# Patient Record
Sex: Male | Born: 1935
Health system: Southern US, Community
[De-identification: ages and names within clinical notes are randomized; demographics above are authoritative.]

## PROBLEM LIST (undated history)

## (undated) DIAGNOSIS — H269 Unspecified cataract: Secondary | ICD-10-CM

## (undated) DIAGNOSIS — H04123 Dry eye syndrome of bilateral lacrimal glands: Secondary | ICD-10-CM

## (undated) DIAGNOSIS — D649 Anemia, unspecified: Secondary | ICD-10-CM

## (undated) DIAGNOSIS — F419 Anxiety disorder, unspecified: Secondary | ICD-10-CM

## (undated) DIAGNOSIS — E119 Type 2 diabetes mellitus without complications: Secondary | ICD-10-CM

## (undated) DIAGNOSIS — C689 Malignant neoplasm of urinary organ, unspecified: Secondary | ICD-10-CM

## (undated) DIAGNOSIS — Z8551 Personal history of malignant neoplasm of bladder: Secondary | ICD-10-CM

## (undated) DIAGNOSIS — E785 Hyperlipidemia, unspecified: Secondary | ICD-10-CM

## (undated) DIAGNOSIS — M159 Polyosteoarthritis, unspecified: Secondary | ICD-10-CM

## (undated) DIAGNOSIS — K219 Gastro-esophageal reflux disease without esophagitis: Secondary | ICD-10-CM

## (undated) DIAGNOSIS — I10 Essential (primary) hypertension: Secondary | ICD-10-CM

## (undated) DIAGNOSIS — Z5189 Encounter for other specified aftercare: Secondary | ICD-10-CM

## (undated) DIAGNOSIS — Z932 Ileostomy status: Secondary | ICD-10-CM

## (undated) DIAGNOSIS — D3502 Benign neoplasm of left adrenal gland: Secondary | ICD-10-CM

## (undated) HISTORY — DX: Hyperlipidemia, unspecified: E78.5

## (undated) HISTORY — DX: Encounter for other specified aftercare: Z51.89

## (undated) HISTORY — DX: Malignant neoplasm of urinary organ, unspecified: C68.9

## (undated) HISTORY — DX: Anemia, unspecified: D64.9

## (undated) HISTORY — DX: Unspecified cataract: H26.9

## (undated) HISTORY — PX: CATARACT EXTRACTION: SUR2

---

## 1998-03-04 ENCOUNTER — Ambulatory Visit (HOSPITAL_COMMUNITY): Admission: RE | Admit: 1998-03-04 | Discharge: 1998-03-04 | Payer: Self-pay | Admitting: Gastroenterology

## 1998-03-09 ENCOUNTER — Ambulatory Visit (HOSPITAL_BASED_OUTPATIENT_CLINIC_OR_DEPARTMENT_OTHER): Admission: RE | Admit: 1998-03-09 | Discharge: 1998-03-09 | Payer: Self-pay | Admitting: Urology

## 1998-05-16 HISTORY — PX: OTHER SURGICAL HISTORY: SHX169

## 1998-10-14 ENCOUNTER — Ambulatory Visit (HOSPITAL_BASED_OUTPATIENT_CLINIC_OR_DEPARTMENT_OTHER): Admission: RE | Admit: 1998-10-14 | Discharge: 1998-10-14 | Payer: Self-pay | Admitting: Urology

## 1998-11-04 ENCOUNTER — Encounter: Payer: Self-pay | Admitting: Urology

## 1998-11-04 ENCOUNTER — Observation Stay (HOSPITAL_COMMUNITY): Admission: RE | Admit: 1998-11-04 | Discharge: 1998-11-05 | Payer: Self-pay | Admitting: Urology

## 1998-12-01 ENCOUNTER — Inpatient Hospital Stay (HOSPITAL_COMMUNITY): Admission: RE | Admit: 1998-12-01 | Discharge: 1998-12-15 | Payer: Self-pay | Admitting: Urology

## 1998-12-01 ENCOUNTER — Encounter: Payer: Self-pay | Admitting: Urology

## 1998-12-01 ENCOUNTER — Encounter (INDEPENDENT_AMBULATORY_CARE_PROVIDER_SITE_OTHER): Payer: Self-pay | Admitting: Specialist

## 1998-12-09 ENCOUNTER — Encounter: Payer: Self-pay | Admitting: Urology

## 1999-03-10 ENCOUNTER — Encounter: Admission: RE | Admit: 1999-03-10 | Discharge: 1999-03-10 | Payer: Self-pay | Admitting: Hematology & Oncology

## 1999-03-10 ENCOUNTER — Encounter: Payer: Self-pay | Admitting: Hematology & Oncology

## 2000-01-20 ENCOUNTER — Encounter: Payer: Self-pay | Admitting: Emergency Medicine

## 2000-01-20 ENCOUNTER — Emergency Department (HOSPITAL_COMMUNITY): Admission: EM | Admit: 2000-01-20 | Discharge: 2000-01-20 | Payer: Self-pay | Admitting: Emergency Medicine

## 2000-01-24 ENCOUNTER — Inpatient Hospital Stay (HOSPITAL_COMMUNITY): Admission: EM | Admit: 2000-01-24 | Discharge: 2000-01-26 | Payer: Self-pay | Admitting: Internal Medicine

## 2000-01-24 ENCOUNTER — Encounter (INDEPENDENT_AMBULATORY_CARE_PROVIDER_SITE_OTHER): Payer: Self-pay | Admitting: Specialist

## 2000-01-24 ENCOUNTER — Encounter: Payer: Self-pay | Admitting: Internal Medicine

## 2000-01-26 ENCOUNTER — Encounter: Payer: Self-pay | Admitting: Internal Medicine

## 2000-12-06 ENCOUNTER — Encounter: Payer: Self-pay | Admitting: Hematology & Oncology

## 2000-12-06 ENCOUNTER — Encounter: Admission: RE | Admit: 2000-12-06 | Discharge: 2000-12-06 | Payer: Self-pay | Admitting: Hematology & Oncology

## 2001-04-09 ENCOUNTER — Encounter (INDEPENDENT_AMBULATORY_CARE_PROVIDER_SITE_OTHER): Payer: Self-pay

## 2001-04-09 ENCOUNTER — Ambulatory Visit (HOSPITAL_COMMUNITY): Admission: RE | Admit: 2001-04-09 | Discharge: 2001-04-09 | Payer: Self-pay | Admitting: Gastroenterology

## 2004-06-09 ENCOUNTER — Ambulatory Visit: Payer: Self-pay | Admitting: Hematology & Oncology

## 2004-08-05 ENCOUNTER — Ambulatory Visit: Payer: Self-pay | Admitting: Internal Medicine

## 2004-12-07 ENCOUNTER — Ambulatory Visit: Payer: Self-pay | Admitting: Hematology & Oncology

## 2004-12-21 ENCOUNTER — Ambulatory Visit: Payer: Self-pay | Admitting: Internal Medicine

## 2005-01-21 LAB — HM COLONOSCOPY

## 2005-03-15 ENCOUNTER — Ambulatory Visit: Payer: Self-pay | Admitting: Internal Medicine

## 2005-03-17 ENCOUNTER — Ambulatory Visit: Payer: Self-pay

## 2005-04-13 ENCOUNTER — Ambulatory Visit: Payer: Self-pay | Admitting: Internal Medicine

## 2005-04-14 ENCOUNTER — Ambulatory Visit: Payer: Self-pay | Admitting: Internal Medicine

## 2005-12-05 ENCOUNTER — Ambulatory Visit: Payer: Self-pay | Admitting: Hematology & Oncology

## 2006-01-11 ENCOUNTER — Emergency Department (HOSPITAL_COMMUNITY): Admission: EM | Admit: 2006-01-11 | Discharge: 2006-01-11 | Payer: Self-pay | Admitting: Emergency Medicine

## 2006-01-18 ENCOUNTER — Ambulatory Visit: Payer: Self-pay | Admitting: Internal Medicine

## 2006-01-23 ENCOUNTER — Ambulatory Visit: Payer: Self-pay | Admitting: Internal Medicine

## 2006-01-30 ENCOUNTER — Ambulatory Visit: Payer: Self-pay | Admitting: Internal Medicine

## 2006-04-27 ENCOUNTER — Ambulatory Visit: Payer: Self-pay | Admitting: Internal Medicine

## 2006-08-16 ENCOUNTER — Ambulatory Visit: Payer: Self-pay | Admitting: Internal Medicine

## 2006-12-06 ENCOUNTER — Ambulatory Visit: Payer: Self-pay | Admitting: Internal Medicine

## 2007-01-23 DIAGNOSIS — F411 Generalized anxiety disorder: Secondary | ICD-10-CM | POA: Insufficient documentation

## 2007-01-23 DIAGNOSIS — Z8679 Personal history of other diseases of the circulatory system: Secondary | ICD-10-CM | POA: Insufficient documentation

## 2007-01-23 DIAGNOSIS — K219 Gastro-esophageal reflux disease without esophagitis: Secondary | ICD-10-CM | POA: Insufficient documentation

## 2007-01-23 DIAGNOSIS — Z9079 Acquired absence of other genital organ(s): Secondary | ICD-10-CM | POA: Insufficient documentation

## 2007-01-23 DIAGNOSIS — C679 Malignant neoplasm of bladder, unspecified: Secondary | ICD-10-CM | POA: Insufficient documentation

## 2007-01-23 DIAGNOSIS — I10 Essential (primary) hypertension: Secondary | ICD-10-CM | POA: Insufficient documentation

## 2007-01-23 DIAGNOSIS — E109 Type 1 diabetes mellitus without complications: Secondary | ICD-10-CM | POA: Insufficient documentation

## 2007-03-21 ENCOUNTER — Encounter: Payer: Self-pay | Admitting: Internal Medicine

## 2007-04-24 ENCOUNTER — Ambulatory Visit: Payer: Self-pay | Admitting: Internal Medicine

## 2007-06-15 ENCOUNTER — Telehealth: Payer: Self-pay | Admitting: Internal Medicine

## 2007-07-19 ENCOUNTER — Encounter: Payer: Self-pay | Admitting: Internal Medicine

## 2007-08-03 ENCOUNTER — Encounter: Payer: Self-pay | Admitting: Internal Medicine

## 2007-10-10 ENCOUNTER — Ambulatory Visit: Payer: Self-pay | Admitting: Internal Medicine

## 2007-10-16 ENCOUNTER — Telehealth: Payer: Self-pay | Admitting: Internal Medicine

## 2008-01-24 ENCOUNTER — Encounter: Payer: Self-pay | Admitting: Internal Medicine

## 2008-04-18 ENCOUNTER — Ambulatory Visit: Payer: Self-pay | Admitting: Internal Medicine

## 2008-07-30 ENCOUNTER — Telehealth: Payer: Self-pay | Admitting: Family Medicine

## 2008-07-31 ENCOUNTER — Ambulatory Visit: Payer: Self-pay | Admitting: Internal Medicine

## 2008-08-29 ENCOUNTER — Ambulatory Visit: Payer: Self-pay | Admitting: Internal Medicine

## 2008-08-29 DIAGNOSIS — M1710 Unilateral primary osteoarthritis, unspecified knee: Secondary | ICD-10-CM

## 2008-08-29 DIAGNOSIS — IMO0002 Reserved for concepts with insufficient information to code with codable children: Secondary | ICD-10-CM | POA: Insufficient documentation

## 2008-10-10 ENCOUNTER — Telehealth: Payer: Self-pay | Admitting: Internal Medicine

## 2008-10-13 ENCOUNTER — Encounter: Payer: Self-pay | Admitting: Internal Medicine

## 2008-11-03 ENCOUNTER — Telehealth: Payer: Self-pay | Admitting: Internal Medicine

## 2009-01-27 ENCOUNTER — Telehealth: Payer: Self-pay | Admitting: Internal Medicine

## 2009-03-02 ENCOUNTER — Ambulatory Visit: Payer: Self-pay | Admitting: Internal Medicine

## 2009-03-11 ENCOUNTER — Encounter: Payer: Self-pay | Admitting: Internal Medicine

## 2009-04-15 ENCOUNTER — Encounter: Payer: Self-pay | Admitting: Internal Medicine

## 2009-06-22 ENCOUNTER — Encounter: Payer: Self-pay | Admitting: Internal Medicine

## 2009-07-16 ENCOUNTER — Encounter: Payer: Self-pay | Admitting: Internal Medicine

## 2009-09-07 ENCOUNTER — Ambulatory Visit: Payer: Self-pay | Admitting: Internal Medicine

## 2009-12-14 ENCOUNTER — Encounter: Payer: Self-pay | Admitting: Internal Medicine

## 2010-02-18 ENCOUNTER — Encounter: Payer: Self-pay | Admitting: Internal Medicine

## 2010-03-02 ENCOUNTER — Encounter: Payer: Self-pay | Admitting: Internal Medicine

## 2010-03-23 ENCOUNTER — Ambulatory Visit: Payer: Self-pay | Admitting: Internal Medicine

## 2010-03-23 DIAGNOSIS — E785 Hyperlipidemia, unspecified: Secondary | ICD-10-CM | POA: Insufficient documentation

## 2010-03-23 DIAGNOSIS — E1069 Type 1 diabetes mellitus with other specified complication: Secondary | ICD-10-CM | POA: Insufficient documentation

## 2010-06-17 NOTE — Assessment & Plan Note (Signed)
Summary: FU Natale Milch   Vital Signs:  Patient profile:   77 year old male Height:      71 inches Weight:      241 pounds BMI:     33.73 O2 Sat:      96 % on Room air Temp:     98.6 degrees F oral Pulse rate:   83 / minute BP sitting:   142 / 82  (left arm) Cuff size:   large  Vitals Entered By: Bill Salinas CMA (September 07, 2009 1:08 PM)  O2 Flow:  Room air CC: pt here with complaint of popping and pressure in left ear/ ab   Primary Care Provider:  Norins  CC:  pt here with complaint of popping and pressure in left ear/ ab.  History of Present Illness: presents for acute hearing loss in the left ear: sounded like a jack hammer. He also c/o feeling "drunk" after he takes his morning meds - metformin, omeprazole and aspirin. Last, he notices he will at times have an intention tremor.  He is otherwise feeling well. He has restarted his walking program - 30-45 minutes 3 times a week. He reports having an abdominal U/s at the [place] - no AAA.   Current Medications (verified): 1)  Aspirin 325 Mg  Tabs (Aspirin) .... Once Daily 2)  Simvastatin 40 Mg  Tabs (Simvastatin) .... Once Daily 3)  Metformin Hcl 1000 Mg  Tabs (Metformin Hcl) .... Two Times A Day 4)  Omeprazole 20 Mg  Tbec (Omeprazole) .... Take 1 Tablet By Mouth Two Times A Day 5)  Cozaar 100 Mg  Tabs (Losartan Potassium) .... Once Daily 6)  Novolin R 100 Unit/ml Soln (Insulin Regular Human) .... Sliding Scale As Needed 7)  Simethicone 80 Mg  Chew (Simethicone) .... As Needed 8)  Apap 325 Mg Tabs (Acetaminophen) .... As Needed 9)  Milk of Magnesia 400 Mg/4ml  Susp (Magnesium Hydroxide) .... As Needed 10)  Eye Drops 0.05 %  Soln (Tetrahydrozoline Hcl) .... As Needed 11)  Ranitidine Hcl 150 Mg Caps (Ranitidine Hcl) .Marland Kitchen.. 1 By Mouth Once Daily 12)  Vitamin C 500 Mg Tabs (Ascorbic Acid) .... Take 1 Tablet By Mouth Once A Day 13)  Iron 325 (65 Fe) Mg Tabs (Ferrous Sulfate) .... Take 1 Tablet By Mouth Two Times A Day 14)   Hydrocodone-Acetaminophen 5-500 Mg Tabs (Hydrocodone-Acetaminophen) .... Take 1 To 2 Tabs By Mouth Two Times A Day For Pain 15)  Lantus 100 Unit/ml Soln (Insulin Glargine) .... Start At 40 At Bedtime Units Qhs 16)  Promethazine-Codeine 6.25-10 Mg/32ml Syrp (Promethazine-Codeine) .Marland Kitchen.. 1 Tsp Q 6 Hrs As Needed.  Allergies (verified): 1)  Lisinopril  Past History:  Past Medical History: Last updated: 07/31/2008 Anxiety GERD Hypertension BLADDER CANCER (ICD-188.9) ANGINA, HX OF (ICD-V12.50) Diabetes mellitus, type II  Past Surgical History: Last updated: 04/24/2007 Appendectomy Radical cystoprastatectomy with creation of an ileal loop urinary diversion for bladder cancer PSH reviewed for relevance, FH reviewed for relevance  Review of Systems       The patient complains of decreased hearing.  The patient denies anorexia, fever, weight loss, weight gain, vision loss, dyspnea on exertion, prolonged cough, headaches, abdominal pain, severe indigestion/heartburn, incontinence, muscle weakness, difficulty walking, depression, enlarged lymph nodes, and angioedema.    Physical Exam  General:  overweight AA male no distress Head:  normocephalic and atraumatic.   Ears:  EACs after irrigation - some residual wax right, clear on the left Abdomen:  obese  Neurologic:  no obvious tremor at today's visit.    Impression & Recommendations:  Problem # 1:  CERUMEN IMPACTION, BILATERAL (ICD-380.4) Successful irrigation by Ami bullins, CMA  Problem # 2:  DIABETES MELLITUS, TYPE I (ICD-250.01) Patient is advised to take his metformin after breakfast to alleviate possible "drunk" feeling after morning meds.  His updated medication list for this problem includes:    Aspirin 325 Mg Tabs (Aspirin) ..... Once daily    Metformin Hcl 1000 Mg Tabs (Metformin hcl) .Marland Kitchen..Marland Kitchen Two times a day    Cozaar 100 Mg Tabs (Losartan potassium) ..... Once daily    Novolin R 100 Unit/ml Soln (Insulin regular human) .....  Sliding scale as needed    Lantus 100 Unit/ml Soln (Insulin glargine) ..... Start at 40 at bedtime units qhs  Complete Medication List: 1)  Aspirin 325 Mg Tabs (Aspirin) .... Once daily 2)  Simvastatin 40 Mg Tabs (Simvastatin) .... Once daily 3)  Metformin Hcl 1000 Mg Tabs (Metformin hcl) .... Two times a day 4)  Omeprazole 20 Mg Tbec (Omeprazole) .... Take 1 tablet by mouth two times a day 5)  Cozaar 100 Mg Tabs (Losartan potassium) .... Once daily 6)  Novolin R 100 Unit/ml Soln (Insulin regular human) .... Sliding scale as needed 7)  Simethicone 80 Mg Chew (Simethicone) .... As needed 8)  Apap 325 Mg Tabs (Acetaminophen) .... As needed 9)  Milk of Magnesia 400 Mg/26ml Susp (Magnesium hydroxide) .... As needed 10)  Eye Drops 0.05 % Soln (Tetrahydrozoline hcl) .... As needed 11)  Ranitidine Hcl 150 Mg Caps (Ranitidine hcl) .Marland Kitchen.. 1 by mouth once daily 12)  Vitamin C 500 Mg Tabs (Ascorbic acid) .... Take 1 tablet by mouth once a day 13)  Iron 325 (65 Fe) Mg Tabs (Ferrous sulfate) .... Take 1 tablet by mouth two times a day 14)  Hydrocodone-acetaminophen 5-500 Mg Tabs (Hydrocodone-acetaminophen) .... Take 1 to 2 tabs by mouth two times a day for pain 15)  Lantus 100 Unit/ml Soln (Insulin glargine) .... Start at 40 at bedtime units qhs 16)  Promethazine-codeine 6.25-10 Mg/97ml Syrp (Promethazine-codeine) .Marland Kitchen.. 1 tsp q 6 hrs as needed.

## 2010-06-17 NOTE — Letter (Signed)
Summary: Lab results/[facility]  Lab results/[facility]   Imported By: Lester Withee 05/21/2009 07:56:56  _____________________________________________________________________  External Attachment:    Type:   Image     Comment:   External Document

## 2010-06-17 NOTE — Assessment & Plan Note (Signed)
Summary: FU [name] [name]   Vital Signs:  Patient profile:   77 year old male Height:      71 inches Weight:      223 pounds BMI:     31.21 O2 Sat:      99 % on Room air Temp:     98.4 degrees F oral Pulse rate:   70 / minute BP sitting:   150 / 100  (left arm) Cuff size:   regular  Vitals Entered By: Alysia Penna (March 23, 2010 1:07 PM)  O2 Flow:  Room air CC: pt here for follow up on BP. /cp sma Comments pt had flu vax at [facility] 03/01/10   Primary Care Cyana Shook:  Norins  CC:  pt here for follow up on BP. /cp sma.  History of Present Illness: [name] [name] is here today for a follow up visit about his chronic conditions.    His blood pressure today was elevated at 150/100 and has been running slightly high almost every morning for the past couple weeks (140-170s/80s-110s).  He was seen at the [facility] office where his physician there changed his Cozaar from 100mg  once a day to 50mg  two times a day, but his blood pressure is remaining elevated in the mornings.  He denies and headaches or changes in vision.    In reguards to his diabetes he has been having some instances of hypoglycemia in the mornings, with blood sugar running in the high 20s sometimes.  He does notice that this happens mostly when he does not eat a bed time snack.  He will wake up feeling nervous, sweaty, and thirsty but after he goes to get a snack in the morning he feels much better.  His labs from the [place] show his A1c down from 8.9 to 7.1 now.  He denies any loss of sensation or tingling in any of his extremities.  His other labs from the [place] show a LDL at 71, HDL at 48, and a Cr at 1.1 - all normal labs.  He also had a colonoscopy where they removed some adenomatous polyps and said he should follow up with a repeat colonoscopy in 5 years.  He also had an eye exam 2 weeks ago that was normal.    Of note he did lose his girlfriend to breast cancer three weeks ago.    Current Medications (verified): 1)  Aspirin 325 Mg   Tabs (Aspirin) .... Once Daily 2)  Simvastatin 40 Mg  Tabs (Simvastatin) .... Once Daily 3)  Metformin Hcl 1000 Mg  Tabs (Metformin Hcl) .... Two Times A Day 4)  Omeprazole 20 Mg  Tbec (Omeprazole) .... Take 1 Tablet By Mouth Two Times A Day 5)  Cozaar 50 Mg  Tabs (Losartan Potassium) .Marland Kitchen.. 1 Tablet Twice A Day 6)  Novolin R 100 Unit/ml Soln (Insulin Regular Human) .... Sliding Scale As Needed 7)  Simethicone 80 Mg  Chew (Simethicone) .... As Needed 8)  Apap 325 Mg Tabs (Acetaminophen) .... As Needed 9)  Milk of Magnesia 400 Mg/77ml  Susp (Magnesium Hydroxide) .... As Needed 10)  Eye Drops 0.05 %  Soln (Tetrahydrozoline Hcl) .... As Needed 11)  Ranitidine Hcl 150 Mg Caps (Ranitidine Hcl) .Marland Kitchen.. 1 By Mouth Once Daily 12)  Vitamin C 500 Mg Tabs (Ascorbic Acid) .... Take 1 Tablet By Mouth Once A Day 13)  Iron 325 (65 Fe) Mg Tabs (Ferrous Sulfate) .... Take 1 Tablet By Mouth Two Times A Day 14)  Hydrocodone-Acetaminophen 5-500  Mg Tabs (Hydrocodone-Acetaminophen) .... Take 1 To 2 Tabs By Mouth Two Times A Day For Pain 15)  Lantus 100 Unit/ml Soln (Insulin Glargine) .... Start At 40 At Bedtime Units Qhs 16)  Promethazine-Codeine 6.25-10 Mg/92ml Syrp (Promethazine-Codeine) .Marland Kitchen.. 1 Tsp Q 6 Hrs As Needed.  Allergies (verified): 1)  Lisinopril  Past History:  Past Medical History: Last updated: 07/31/2008 Anxiety GERD Hypertension BLADDER CANCER (ICD-188.9) ANGINA, HX OF (ICD-V12.50) Diabetes mellitus, type II  Past Surgical History: Last updated: 04/24/2007 Appendectomy Radical cystoprastatectomy with creation of an ileal loop urinary diversion for bladder cancer  Family History: Last updated: 04/24/2007  father- vascular diseae mother - died 27, DM  Social History: Last updated: 04/24/2007 has done many things: Paediatric nurse, CMA, businessman 2 daugters, both have died, one this year. He has a grandson. Lives alone. [place], but doesn't make long trips.  Risk Factors: Exercise: no  (04/24/2007)  Risk Factors: Smoking Status: quit (01/23/2007)  Review of Systems       The patient complains of weight loss.  The patient denies anorexia, fever, weight gain, vision loss, decreased hearing, hoarseness, chest pain, syncope, peripheral edema, prolonged cough, headaches, hemoptysis, abdominal pain, melena, hematochezia, hematuria, incontinence, muscle weakness, suspicious skin lesions, transient blindness, difficulty walking, depression, unusual weight change, abnormal bleeding, and enlarged lymph nodes.    Physical Exam  General:  alert, well-developed, and well-nourished.   Head:  normocephalic and atraumatic.   Eyes:  pupils equal, pupils round, pupils reactive to light, and corneas and lenses clear.   Ears:  R cerumen impaction and L Cerumen impaction.   Nose:  no external deformity, no external erythema, no nasal discharge, and no mucosal edema.   Mouth:  good dentition, pharynx pink and moist, no erythema, and no exudates.   Neck:  supple, full ROM, no carotid bruits, and no cervical lymphadenopathy.   Lungs:  normal respiratory effort, normal breath sounds, no crackles, and no wheezes.   Heart:  normal rate, no murmur, no gallop, no rub, and grade 2/6 systolic murmur heard best at the right sternal boarder.  A few PVCs heard.   Abdomen:  soft, non-tender, normal bowel sounds, and no distention.  Urostomy bag. Pulses:  R radial normal, R posterior tibial normal, and L radial normal.   Neurologic:  alert & oriented X3, cranial nerves II-XII intact, sensation intact to light touch, sensation intact to pinprick, and gait normal.   Skin:  turgor normal, color normal, and no rashes.   Psych:  Oriented X3, memory intact for recent and remote, normally interactive, and good eye contact.     Impression & Recommendations:  Problem # 1:  HYPERTENSION (ICD-401.9) His blood pressure does not seem to be well controlled as of yet.  SInce his doctor at the [place] is also managing his  chronic conditions changing his medications would be more difficult.  Adding a medication seems to be the best option.  Plan:  Add Lasix 40mg  once a day.           Continue Cozzar 50mg  two times a day.            patient will monitor BP at home and report back  Problem # 2:  DIABETES MELLITUS, TYPE I (ICD-250.01) His diabetes seems to be much better controlled with his A1c down to 7.1.  He recently changed his diet and has been walking an hour a day 3-4 days per week.  In reguard to his low blood sugars in the morning diet  counseling on the importance of a bed time snack was given.    Plan:  Continue exercise and diet           Continue current medication           Call if feeling lightheaded or dizzy again.             His updated medication list for this problem includes:    Aspirin 325 Mg Tabs (Aspirin) ..... Once daily    Metformin Hcl 1000 Mg Tabs (Metformin hcl) .Marland Kitchen..Marland Kitchen Two times a day    Novolin R 100 Unit/ml Soln (Insulin regular human) ..... Sliding scale as needed    Lantus 100 Unit/ml Soln (Insulin glargine) ..... Start at 40 at bedtime units qhs  Problem # 3:  HYPERLIPIDEMIA (ICD-272.4) Stable - LDL at 71 and HDL at 48.  Plan:  Continue current medications  His updated medication list for this problem includes:    Simvastatin 40 Mg Tabs (Simvastatin) ..... Once daily  Complete Medication List: 1)  Aspirin 325 Mg Tabs (Aspirin) .... Once daily 2)  Simvastatin 40 Mg Tabs (Simvastatin) .... Once daily 3)  Metformin Hcl 1000 Mg Tabs (Metformin hcl) .... Two times a day 4)  Omeprazole 20 Mg Tbec (Omeprazole) .... Take 1 tablet by mouth two times a day 5)  Cozaar 50 Mg Tabs (losartan Potassium)  .Marland Kitchen.. 1 tablet twice a day 6)  Novolin R 100 Unit/ml Soln (Insulin regular human) .... Sliding scale as needed 7)  Simethicone 80 Mg Chew (Simethicone) .... As needed 8)  Apap 325 Mg Tabs (Acetaminophen) .... As needed 9)  Milk of Magnesia 400 Mg/52ml Susp (Magnesium hydroxide) .... As  needed 10)  Eye Drops 0.05 % Soln (Tetrahydrozoline hcl) .... As needed 11)  Ranitidine Hcl 150 Mg Caps (Ranitidine hcl) .Marland Kitchen.. 1 by mouth once daily 12)  Vitamin C 500 Mg Tabs (Ascorbic acid) .... Take 1 tablet by mouth once a day 13)  Iron 325 (65 Fe) Mg Tabs (Ferrous sulfate) .... Take 1 tablet by mouth two times a day 14)  Hydrocodone-acetaminophen 5-500 Mg Tabs (Hydrocodone-acetaminophen) .... Take 1 to 2 tabs by mouth two times a day for pain 15)  Lantus 100 Unit/ml Soln (Insulin glargine) .... Start at 40 at bedtime units qhs 16)  Promethazine-codeine 6.25-10 Mg/59ml Syrp (Promethazine-codeine) .Marland Kitchen.. 1 tsp q 6 hrs as needed.   Orders Added: 1)  Est. Patient Level III [16109]

## 2010-06-18 NOTE — Procedures (Signed)
Summary: Colonoscopy / [facility]  Colonoscopy / [facility]   Imported By: Lennie Odor 03/23/2010 11:05:26  _____________________________________________________________________  External Attachment:    Type:   Image     Comment:   External Document

## 2010-07-23 ENCOUNTER — Telehealth: Payer: Self-pay | Admitting: Internal Medicine

## 2010-07-27 NOTE — Progress Notes (Signed)
Summary: Rx request  Phone Note Call from Patient Call back at Home Phone [phone]   Summary of Call: Pt states that he has sinus infection and is requesting [facility] to be called in for him to [facility] [place]. Pt states that he usually gets this about once a year. Please Advise.  Initial call taken by: Burnard Leigh [place]),  July 23, 2010 12:57 PM  Follow-up for Phone Call        OK for a zpak 250 as directed Follow-up by: Jacques Navy MD,  July 23, 2010 1:11 PM  Additional Follow-up for Phone Call Additional follow up Details #1::        called [facility] Rx via telephone to [facility] [phone]. called Pt and informed.

## 2010-10-01 NOTE — Consult Note (Signed)
[facility]  Patient:    SHRAY, HUNLEY                    MRN: [id number] Proc. Date: 01/24/00 Adm. Date:  [id number] Attending:  Tresa Garter CC:         Sonda Primes, M.D. [facility]   Consultation Report  REASON FOR CONSULTATION:  Dr. Trinna Post Plotnikov asked [place] to see this 77 year old African-American male because of apparent dysphagia symptoms.  HISTORY:  Mr. Laverdure is known to my partner, Dr. Jonny Ruiz C. Madilyn Fireman, because of several colonoscopies performed over the past nine years, initially because of rectal bleeding and then Hemoccult-positive stool, more recently for surveillance of small colonic adenomas which were noted on the second and then the most recent colonoscopies (December 1999).  With that background, Mr. Stapel was admitted to the hospital today because of increasingly severe "swallowing problems."  These are rather nonspecific and do not seem to fall into a particular pattern or category of atypical GI disorder.  He gives a history of longstanding intermittent dysphagia and also dyspepsia symptoms, but today awakened with a sense or a swollen throat.  He has had a sense of pressure and bloating and sometimes could swallow after he would "burp up" the air, but I do not think there has been any problem with a frank food impaction.  Recently, the patient had a viral illness, for which he was actually seen at the [facility] several days ago.  He seems to be improving from that.  This morning, he awoke unable to swallow, with a sense that his throat was "swollen," but he was able to breathe okay.  There has been no problem with odynophagia or reflux symptomatology.  There has been declining food intake over the past week due to progressively severe dysphagia symptoms which have affected both liquid or solid intake.  There may have been some weight loss during this time but it has not  been quantitated.  ALLERGIES:  No known allergies.  CURRENT MEDICATIONS:  Glyburide, Metformin, lisinopril, amlodipine, simvastatin, aspirin.  OPERATIONS:  Bladder pouch for bladder cancer.  MEDICAL ILLNESSES:  Bladder cancer.  Type 2 diabetes.  Hypertension.  HABITS:  The patient stopped smoking about 30 years ago after about 20 years of smoking.  He has two alcoholic beverages daily.  SOCIAL HISTORY:  The patient is disabled at this time.  REVIEW OF SYSTEMS:  Per HPI.  No problem with abdominal pain.  PHYSICAL EXAMINATION  GENERAL:  A stout, healthy-appearing African-American in no evident distress.  HEENT:  He is anicteric.  The oropharynx is grossly benign and without any obvious pharyngeal lesions that I could see.  CHEST:  Clear to auscultation anteriorly.  HEART:  Without murmurs to my examination at this time.  ABDOMEN:  Somewhat obese, soft and without tenderness.  RECTAL:  Exam by my physician assistant showed heme-negative stool.  LABORATORY DATA:  Hemoglobin 11, MCV 90, white count 7600, platelets normal. BUN 18, creatinine 1.5, glucose 151.  Sed rate elevated at 79.  Liver chemistries normal.  IMPRESSION 1. Nonspecific dysphagia-like symptoms which do not sound like typical    anatomic dysphagia, raising the question of a motility disorder or perhaps    even some sort of allergic or inflammatory swelling of the pharyngeal    tissues. 2. Dyspepsia. 3. Previous history of colonic adenomas. 4. History of bladder cancer.  PLAN 1. Upper GI series with barium tablet in the morning to rule  out gross    anatomic problems. 2. If a tumor is identified on the upper GI series, I would proceed to    endoscopic evaluation with biopsy. 3. If a ring or stricture are identified on the upper GI series, I would favor    esophageal dilatation in the context of an upper endoscopy. 4. If the upper GI series is negative, I would consider a modified barium    swallow or a  cine esophagogram to evaluate the oropharyngeal function and    cervical esophagus more effectively; we could also consider ENT evaluation    and possibly esophageal manometry, although I think the latter test would    be of low yield.  I appreciate the opportunity to have seen this patient in consultation for you. DD:  01/24/00 TD:  01/25/00 Job: [id number]

## 2010-10-01 NOTE — Procedures (Signed)
[facility]  Patient:    [name] [name], [name] [name] Visit Number: [id number] MRN: [id number]          Service Type: END Location: ENDO Attending Physician:  Louie Bun Dictated by:   Everardo All Madilyn Fireman, M.D. Proc. Date: 04/09/01 Admit Date:  04/09/2001   CC:         Rosalyn Gess. Norins, M.D. [facility]   Procedure Report  PROCEDURE:  Colonoscopy with polypectomy.  SURGEON:  John C. Madilyn Fireman, M.D.  INDICATIONS FOR PROCEDURE:  History of adenomatous colon polyps with last colonoscopy three years ago.  DESCRIPTION OF PROCEDURE:  The patient was placed in the left lateral decubitus position and placed on the pulse monitor with continuous low flow oxygen delivered by nasal cannula.  He was sedated with 70 mg IV Demerol and 6 mg of IV Versed.  The Olympus video colonoscope was inserted into the rectum and advanced to the cecum, confirmed by transillumination of McBurneys point, and visualization of the ileocecal valve and appendiceal orifice.  The prep was good.  The cecum appeared normal.  Within the ascending colon, there was an 8 mm sessile polyp which was fulgurated by hot biopsy.  The transverse, descending, sigmoid, and rectum appeared normal with no further polyps, masses, diverticula, or other mucosal abnormalities.  The rectum likewise appeared normal and retroflexed view of the anus revealed no obvious internal hemorrhoids.  The colonoscope was then withdrawn and the patient returned to the recovery room in stable condition.  He tolerated the procedure well and there were no immediate complications.  IMPRESSION:  Ascending colon polyp, otherwise normal colonoscopy. Dictated by:   Everardo All Madilyn Fireman, M.D. Attending Physician:  Louie Bun DD:  04/09/01 TD:  04/09/01 Job: [id number]

## 2010-10-01 NOTE — H&P (Signed)
[facility]  Patient:    [name] [name], [name] [name]                    MRN: [id number] Adm. Date:  [id number] Disc. Date: [id number] Attending:  Tresa Garter CC:         Rosalyn Gess. Norins, M.D. [facility]   History and Physical  DATE OF BIRTH:  [birth year].  CHIEF COMPLAINT:  Gagging, unable to swallow.  HISTORY OF PRESENT ILLNESS:  The patient is a 77 year old male who was seen in the office with the above complaint of 1-1/2 weeks duration, sore throat, chills, elevated temperature, and fever.  PAST MEDICAL HISTORY:  Hypertension, diabetes, bladder cancer.  SOCIAL HISTORY:  She is an ex-smoker, who lives alone.  ALLERGIES:  None.  MEDICATIONS:  Metformin 500 mg t.i.d., amlodipine 5 mg daily, lisinopril 20 mg daily, Glyburide two tablets of 5 mg daily.  FAMILY HISTORY:  Both parents deceased of unknown cause.  REVIEW OF SYSTEMS:  Some chills and elevated temperature lately, sore throat, weakness, lately unable to eat.  Weight loss of six pounds.  The rest is as above or negative.  PHYSICAL EXAMINATION:  GENERAL:  The patient is in no acute distress, looks tired.  VITAL SIGNS:  Blood pressure 100/64, pulse 76, temperature 97.9, weight 228 pounds.  HEENT:  Slightly dry oral mucosa.  NECK:  Supple.  Cervical lymph glands are slightly enlarged.  LUNGS:  With decreased breath sounds.  No wheezes or rales.  HEART:  S1, S2, grade 2/6 systolic murmur.  ABDOMEN:  Soft, nontender, no organomegaly, no masses felt.  SKIN:  With aging changes.  NEUROLOGIC:  Deep tendon reflexes, muscle strength within normal limits.  She is alert and oriented and cooperative.  ASSESSMENT AND PLAN: 1. Acute dysphagia.  Will start Protonix IV.  GI consult tomorrow. 2. Dehydration.  Start with IV fluids, rehydrate. 3. Diabetes mellitus type 2.  Will use insulin sliding scale, continue current    therapy. 4. Hypertension.  Continue current therapy. 5. Weight  loss of six pounds.  Likely related to problem #1. DD:  03/08/00 TD:  03/08/00 Job: [id number]

## 2010-10-01 NOTE — Assessment & Plan Note (Signed)
[facility]                           PRIMARY CARE OFFICE NOTE   NAME:Kiker, DAVIONNE                    MRN:          [id number]  DATE:04/27/2006                            DOB:          1933/12/14    Mr. Ausmus is a 77 year old gentleman who is followed for diabetes,  hypertension, history of bladder cancer, who presents for followup  evaluation and exam.  The patient was last seen January 30, 2006.  He  is also followed at the [facility].   PAST MEDICAL HISTORY:   SURGICAL:  The patient underwent radical cystoprostatectomy with node  dissection and ileal loop urinary diversion with incidental appendectomy  July of 2000.  No other surgeries reported.   PAST MEDICAL HISTORY:  1. Usual childhood disease.  2. NIDDM.  3. Hypertension.  4. GERD.  5. Bladder cancer.  6. Anxiety.  7. Angina.   CURRENT MEDICATIONS:  1. Lisinopril 20 mg daily.  2. Aspirin 325 mg daily.  3. Simvastatin 20 mg daily.  4. Metformin 1 g b.i.d.  5. Ferrous sulfate b.i.d.  6. Omeprazole 20 mg b.i.d.  7. Ascorbic acid 500 mg daily.  8. Cozaar 100 mg daily.  9. Levemir 35 units nightly.  10.Simethicone p.r.n.  11.Nitroglycerin p.r.n.   FAMILY HISTORY:  Positive for peripheral vascular disease in his father.  Mother died at age 51 of complications of diabetes.   SOCIAL HISTORY:  The patient has been divorced since 72.  He has 2  daughters, 1 of whom is deceased.  He has 2 grandchildren.  The patient  lives alone and independently.   PHYSICIAN ROSTER:  Dr. Larey Dresser for urology.  Dr. Arlan Organ  for oncology.  The patient is seen at the [facility].   CHART REVIEW:  Adenosine Myoview study March 17, 2005, which is  negative for ischemia, with an EF of 49%.   Colonoscopy performed at the [facility] January 21, 2005 with the  patient having polypectomy and __________ biopsy.  Path report is not  available to me.  The patient underwent upper  endoscopy and treatment  for dysphagia September 2001.  Last note from Dr. Myna Hidalgo dated December 07, 2005, the patient is thought to be stable in regards to his bladder  cancer stage III N0 M0.  Last laboratory, including a lipid panel from  January 18, 2006, with an LDL of 103, hemoglobin A1C was 8.1% at that  time.   REVIEW OF SYSTEMS:  The patient has had no constitutional, ophthalm,  ENT, cardiovascular, or respiratory problems.  GI:  The patient is well-  controlled on his omeprazole, but has occasional breakthrough  discomfort.  GU:  The patient is current with Dr. Vonita Moss and is  scheduled for a kidney scan later this month.   EXAMINATION:  Temperature 97.3, blood pressure 130/78, pulse 71, weight  234.  GENERAL APPEARANCE:  This is an obese African-American gentleman who is  in no acute distress.  HEENT:  Normocephalic, atraumatic.  EACs were without cerumen impaction.  TMs  appear normal.  Oropharynx, the patient has an upper denture.  He  is missing the molars on the mandible.  Conjunctivae and sclerae were  clear.  Funduscopic exam was normal with a hand-held instrument.  NECK:  Supple without thyromegaly.  NODES:  No adenopathy was noted in the cervical or supraclavicular  regions.  CHEST:  No CVA tenderness_.  LUNGS:  Clear with no rales, wheezes, or rhonchi.  CARDIOVASCULAR:  2+ radial pulses.  No JVD or carotid bruits.  He had a  regular rate and rhythm without murmurs, rubs, or gallops.  ABDOMEN:  Obese with positive bowel sounds.  Further exam hindered by  girth.  GU AND RECTAL:  Deferred to Dr. Vonita Moss.  EXTREMITIES:  Without cyanosis, clubbing, or edema, or deformity.  NEUROLOGIC:  Nonfocal.  DERM:  The patient had some mild erythema to the perioral region, but no  frank lesions were noted.   LABORATORY:  The patient is sent for hemoglobin A1C at today's visit, as  well as a metabolic panel.   ASSESSMENT AND PLAN:  1. Hypertension.  The patient's blood  pressure is adequately      controlled on his present medical regimen.  He will continue the      same.  2. Diabetes.  The patient is now on Levemir.  His home record      indicates that he is generally well-controlled, but has had some      hypoglycemic episodes.  Plan:  The patient to continue metformin      and Levemir.  He is for A1C today.  Will adjust his medications as      indicated.  3. Hyperlipidemia.  The patient's lab in September showed adequate      control, almost at goal, and we will continue his present dose of      simvastatin.  4. Iron deficiency anemia.  The patient's last hemoglobin in our chart      dates from 2006 and was 11.5.  The patient is on iron replacement.   PLAN:  1. The patient to have laboratory today to include a CBC and iron      level.  Will adjust his medications as needed.  2. Health maintenance.  The patient is currently up to date with      colorectal cancer screening.   SUMMARY:  He is a very pleasant gentleman.  He does need to work on  weight loss.  He, otherwise, is medically stable and will return to see  me on a p.r.n. basis.     Rosalyn Gess Norins, MD  Electronically Signed    MEN/MedQ  DD: 04/27/2006  DT: 04/27/2006  Job #: [id number]   cc:   Allean Found  Maretta Bees. Vonita Moss, M.D.  Rose Phi. Myna Hidalgo, M.D.

## 2010-10-01 NOTE — Discharge Summary (Signed)
[facility]  Patient:    [name] [name], [name] [name]                    MRN: [id number] Adm. Date:  [id number] Disc. Date: [id number] Attending:  Tresa Garter CC:         Everardo All. Madilyn Fireman, M.D.  Maretta Bees. Vonita Moss, M.D.   Discharge Summary  ADMISSION DIAGNOSIS:  Dysphagia.  DISCHARGE DIAGNOSIS:  Dysphagia improved with no sign of obstruction.  CONSULTATIONS:  Dr. Theodis Sato. Dorena Cookey for GI.  PROCEDURES: 1. Chest x-ray which revealed mild pulmonary vascular prominence. 2. Upper GI which revealed no abnormality, no obstruction of the esophagus    with normal peristalsis, passing a 13 mm tablet freely into the esophagus    and into the stomach.  No hiatal hernia or reflux was noted.  HISTORY OF PRESENT ILLNESS:  The patient had been seen in [facility] Emergency Department on September 6 complaining of diffuse nonspecific symptoms and was diagnosed with a viral illness.  Over the next several days, he did fairly well but presented to the office on the day of admission complaining of inability to swallow.  Because of this problem, the patient was admitted to the hospital for further evaluation and IV fluids.  PAST MEDICAL HISTORY, FAMILY HISTORY, AND SOCIAL HISTORY: Well documented in patients recent hospitalization records.  He does have type 2 diabetes and hypertension which have been well controlled as an outpatient.  The patients bladder cancer has been in remission.  He has completed his course of chemotherapy.  The patient was also noted to have a colonic adenoma.  ADMISSION LABORATORY DATA:  Hemoglobin 11.0, white count 7600 with normal differential.  Sed rate was 79.  Chemistries revealed a sodium of 134, potassium 5.2, glucose 151, BUN 18, creatinine 1.5.  Liver functions were normal with an ALT of 18, ALP 68.  UA was positive for protein.  He had 0 to 5 WBC, a few hyaline casts.  He had positive RBCs.  This was all related to his ureterotomy  and bladder cancer.  HOSPITAL COURSE:  The patient was seen during his hospital stay by GI consultants.  He actually started to see improvement in his swallow on the first full hospital day.  The patients studies were done as noted above with no signs of significant obstruction.  With his symptoms improving and essentially resolving and able to take a regular diet, he is felt to be stable and ready for discharge home.  DISCHARGE PHYSICAL EXAMINATION:  VITAL SIGNS:  Temperature afebrile, blood pressure 116/69, pulse 55, respirations 18.  CBG 133.  GENERAL:  He is an obese black male who looks his stated age.  He is in no acute distress.  LUNGS:  Clear with no rales, wheezes, or rhonchi.  ABDOMEN:  Soft with positive bowel sounds.  CARDIOVASCULAR:  Exam unremarkable with regular rate and rhythm.  EXTREMITIES:  Without edema.  FINAL LABORATORY DATA:  Sodium 137, potassium 4.4, chloride 109, CO2 24, BUN 10, creatinine 0.9, glucose 147, calcium 9.  UA was repeated with few bacteria, studies as noted.  DISCHARGE MEDICATIONS:  The patient will continue on all of his home medications as at admission.  FOLLOWUP:  The patient will be seen by Dr. Dorena Cookey as an outpatient for followup for treatment of chronic GERD symptoms with possible acute exacerbation by virus with no significant fixed obstruction or lesion.  The patients diabetes and hypertension are stable.  The patient will see  Dr. Debby Bud in the office for routine followup, particularly to address the erythrocyte sedimentation rate of 79.  CONDITION UPON DISCHARGE:  Stable. DD:  01/26/00 TD:  01/28/00 Job: [id number]

## 2010-10-01 NOTE — Assessment & Plan Note (Signed)
[facility]                           PRIMARY CARE OFFICE NOTE   NAME:[name] [name]                    MRN:          [id number]  DATE:08/16/2006                            DOB:          Sep 14, 1933    [name] [name] was last seen in December.  Please see that complete  dictation.  The patient returns today for followup.  He has brought with  him some records he has kept. This shows his blood pressure as showing  good control with multiple readings that were within normal range.  The  patient's CVG record also is good, showing blood sugars running in the  120 to 130 range with occasional lows, occasional highs.  The patient  brought with him results from a lipid panel done at the [place], which showed  an LDL of 96, HDL of 37.  He is due for a followup in April.  His LFTs  were normal.   The patient's chief complaint today is anxiety.  He has had some  significant losses with his wife having died of colon cancer, his  youngest daughter having died of lung cancer, now his only remaining  daughter at age 44 is dying of liver cancer.  The patient obviously has  been very stressed and anxious about this.  He does have a prescription  from me for Xanax which I have instructed him to take on an as needed  basis.   The patient reports he is having increasing problem with hip and back  pain.  He has been intolerant of ibuprofen because of GI upset.   CURRENT MEDICATIONS:  1. Lisinopril 20 mg daily.  2. Aspirin 325 mg daily.  3. Simvastatin 40 mg daily.  4. Metformin 1 gm b.i.d.  5. Ferrous sulfate b.i.d.  6. Omeprazole 20 mg b.i.d.  7. Ascorbic acid 500 mg daily.  8. Cozaar 100 mg daily.  9. Levemir 40 units nightly.  10.__________ p.r.n.   REVIEW OF SYSTEMS:  Is negative.   EXAMINATION:  Temperature was 98.1, blood pressure 145/84, pulse 65,  weight 242, up 8 pounds from his last visit.  GENERAL:  Appearance, this is an obese African American  gentleman in no  acute distress.  LUNGS:  Were clear.  CARDIOVASCULAR:  Exam revealed a regular rate and rhythm without  murmurs.  ABDOMEN:  Was soft. No guarding or rebound was noted.  No further  examination conducted.   ASSESSMENT/PLAN:  1. Hypertension.  The patient is well controlled and will use present      medications.  2. Diabetes.  Based on his CVG records, his diabetes is controlled.      Last day when seen in our office was 8.5 in December 2007.  The      patient reports he is to have an A1C checked at the [facility]. I      have asked him request that records be forwarded to me, so that we      can ensure good control and get him to goal at 7% or less.  3. Hyperlipidemia.  The patient's LDL was 96.  Goal for him as a      diabetic would be less than 100. However, he has other risk factors      including hypertension and obesity, so it would be reasonable to      try to get him down into the 80 range.  [facility], although he would be a good      candidate for the addition of Zetia to his current regimen.  4. Anxiety. The patient with significant family stress as impending      loss of his daughter.  I have encouraged him to go to [place] to      spend time with her.  He should feel comfortable taking Xanax on an      as needed basis.  5. Osteoarthritis.  The patient with ongoing back pain.  He has been      intolerant of ibuprofen.  I have suggested he consider etodolac and      ask to see if the [place] would cover this.  Alternative would be      diclofenac.  He will check with his physician at the [place] in this      regard.  6. In summary, he is a gentleman who has multiple medical problems,      who needs better control of his diabetes and weight loss.  He was      asked to return to see me on an as needed basis.     [name] Gess Norins, MD  Electronically Signed    MEN/MedQ  DD: 08/17/2006  DT: 08/17/2006  Job  #: [id number]   cc:   [facility] [place]

## 2010-10-01 NOTE — Consult Note (Signed)
[facility]  Patient:    [name] [name], [name] [name]                    MRN: [id number] Proc. Date: 01/25/00 Adm. Date:  [id number] Attending:  Tresa Garter CC:         Titus Dubin. Alwyn Ren, M.D. [facility] R. Myna Hidalgo, M.D.   Consultation Report  HISTORY OF PRESENT ILLNESS:  This 77 year old black male is well known to me, and I was asked to see him for evaluation of microhematuria.  He has had a history of bladder carcinoma that had been resected going back to 1998, and also he was treated with BCG.  He developed a high-grade bladder carcinoma with invasive disease, and therefore he underwent radical cystoprostatectomy and ileal loop urinary diversion and appendectomy on December 02, 1998.  He has been seen in follow-up and has been getting along nicely as far as his cystectomy was concerned.  He was seen postoperatively by Dr. Arlan Organ, and he did undergo adjuvant chemotherapy in December 2000 with cisplatin and Gemzar.  He saw Dr. Myna Hidalgo in August 2000, and there was no evidence then of any recurrent disease.  I saw him earlier this month, and he was having no flank pain or gross hematuria but had some low-grade fever.  I did treat him at that time with Cipro.  He is not having any current gross hematuria or flank pain.  PHYSICAL EXAMINATION:  GENERAL:  He is alert and oriented.  Skin is warm and dry.  He is in no acute distress.  GENITOURINARY:  There is no CVA tenderness.  Abdomen is soft and nontender. Ileal loop urinary stoma bag is draining grossly clear urine.  IMPRESSION:  History of cystectomy for bladder carcinoma with microhematuria.  IMPRESSION/PLAN:  Ileal loop urinary diversions often have small numbers of red and white cells in a routine urinalysis.  Just to make sure that there is no problem, I will order urine cytologies x 3. DD:  01/25/00 TD:  01/26/00 Job: [id number]

## 2010-11-22 ENCOUNTER — Encounter: Payer: Self-pay | Admitting: Internal Medicine

## 2010-11-23 ENCOUNTER — Ambulatory Visit (INDEPENDENT_AMBULATORY_CARE_PROVIDER_SITE_OTHER): Payer: Medicare Other | Admitting: Internal Medicine

## 2010-11-23 DIAGNOSIS — I10 Essential (primary) hypertension: Secondary | ICD-10-CM

## 2010-11-23 DIAGNOSIS — C679 Malignant neoplasm of bladder, unspecified: Secondary | ICD-10-CM

## 2010-11-23 DIAGNOSIS — E109 Type 1 diabetes mellitus without complications: Secondary | ICD-10-CM

## 2010-11-23 NOTE — Progress Notes (Signed)
  Subjective:    Patient ID: [name] [name], male    DOB: [birth year], 77 y.o.   MRN: [id number]  HPI [name] [name] presents for routine follow-up. IN the interval he has been doing OK. He did have an elevated A1C at 7.6% at the [place] in May and was told to increase his lantus. THey also want him to enroll in an exercise program.  He did see Dr. Vonita Moss recently and he is doing well.   His blood pressure has been recently well controlled. He does have a problem with furosemide due to concern about dehydration.   GERD- he does much better with ranitidine 150mg  capsules as opposed to tablets. He is also taking omeprazole 40mg  q AM.   PMH, FamHx and SocHx reviewed for any changes and relevance.      Review of Systems Review of Systems  Constitutional:  Negative for fever, chills, activity change and unexpected weight change.  HEENT:  Negative for hearing loss, ear pain, congestion, neck stiffness and postnasal drip. Negative for sore throat or swallowing problems. Negative for dental complaints.   Eyes: Negative for vision loss or change in visual acuity.  Respiratory: Negative for chest tightness and wheezing.   Cardiovascular: Negative for chest pain and palpitation. No decreased exercise tolerance Gastrointestinal: No change in bowel habit. No bloating or gas. Controlled reflux/indigestion Genitourinary: Negative for urgency, frequency, flank pain and difficulty urinating.  Musculoskeletal: Negative for myalgias, back pain, arthralgias and gait problem.  Neurological: Negative for dizziness, tremors, weakness and headaches.  Hematological: Negative for adenopathy.  Psychiatric/Behavioral: Negative for behavioral problems and dysphoric mood.       Objective:   Physical Exam Vitals reviewed Gen'l- obese AA man in no distress. We're happy to see each other HEENT - C&S clear Pulmonary - unlabored respirations Cor - regular pulse Abdomen - obese Neuro A&O x 3, ambulates with a  cane.       Assessment & Plan:

## 2010-11-24 NOTE — Assessment & Plan Note (Signed)
Many years stable. No problems with ileostomy. He is current with urology: Dr Vonita Moss upon retirement will assign him to another urologist.

## 2010-11-24 NOTE — Assessment & Plan Note (Signed)
Monitored at the [place] with last A1C 7.6%- [facility] has increased his lantus. He will have follow-up monitoring there.

## 2010-11-24 NOTE — Assessment & Plan Note (Signed)
BP Readings from Last 3 Encounters:  11/23/10 148/88  03/23/10 150/100  09/07/09 142/82   He seems to be stable at the high end of desired range. He will have his medications adjusted at the [facility] clinic

## 2011-03-14 ENCOUNTER — Emergency Department (HOSPITAL_COMMUNITY)
Admission: EM | Admit: 2011-03-14 | Discharge: 2011-03-14 | Disposition: A | Discharge status: Home / Self Care | Payer: Medicare Other | Attending: Emergency Medicine | Admitting: Emergency Medicine

## 2011-03-14 ENCOUNTER — Telehealth: Payer: Self-pay

## 2011-03-14 ENCOUNTER — Emergency Department (HOSPITAL_COMMUNITY): Payer: Medicare Other

## 2011-03-14 DIAGNOSIS — J449 Chronic obstructive pulmonary disease, unspecified: Secondary | ICD-10-CM | POA: Insufficient documentation

## 2011-03-14 DIAGNOSIS — R079 Chest pain, unspecified: Secondary | ICD-10-CM | POA: Insufficient documentation

## 2011-03-14 DIAGNOSIS — J4489 Other specified chronic obstructive pulmonary disease: Secondary | ICD-10-CM | POA: Insufficient documentation

## 2011-03-14 DIAGNOSIS — I10 Essential (primary) hypertension: Secondary | ICD-10-CM | POA: Insufficient documentation

## 2011-03-14 DIAGNOSIS — Z794 Long term (current) use of insulin: Secondary | ICD-10-CM | POA: Insufficient documentation

## 2011-03-14 DIAGNOSIS — E785 Hyperlipidemia, unspecified: Secondary | ICD-10-CM | POA: Insufficient documentation

## 2011-03-14 DIAGNOSIS — R0602 Shortness of breath: Secondary | ICD-10-CM | POA: Insufficient documentation

## 2011-03-14 DIAGNOSIS — E119 Type 2 diabetes mellitus without complications: Secondary | ICD-10-CM | POA: Insufficient documentation

## 2011-03-14 DIAGNOSIS — R05 Cough: Secondary | ICD-10-CM | POA: Insufficient documentation

## 2011-03-14 DIAGNOSIS — K219 Gastro-esophageal reflux disease without esophagitis: Secondary | ICD-10-CM | POA: Insufficient documentation

## 2011-03-14 DIAGNOSIS — R059 Cough, unspecified: Secondary | ICD-10-CM | POA: Insufficient documentation

## 2011-03-14 DIAGNOSIS — Z79899 Other long term (current) drug therapy: Secondary | ICD-10-CM | POA: Insufficient documentation

## 2011-03-14 LAB — BASIC METABOLIC PANEL WITH GFR
BUN: 13 mg/dL (ref 6–23)
CO2: 22 meq/L (ref 19–32)
Calcium: 10.5 mg/dL (ref 8.4–10.5)
Chloride: 104 meq/L (ref 96–112)
Creatinine, Ser: 1.04 mg/dL (ref 0.50–1.35)
GFR calc Af Amer: 79 mL/min — ABNORMAL LOW
GFR calc non Af Amer: 68 mL/min — ABNORMAL LOW
Glucose, Bld: 182 mg/dL — ABNORMAL HIGH (ref 70–99)
Potassium: 4.6 meq/L (ref 3.5–5.1)
Sodium: 137 meq/L (ref 135–145)

## 2011-03-14 LAB — POCT I-STAT TROPONIN I
Troponin i, poc: 0 ng/mL (ref 0.00–0.08)
Troponin i, poc: 0 ng/mL (ref 0.00–0.08)

## 2011-03-14 LAB — CBC
MCH: 28.6 pg (ref 26.0–34.0)
MCHC: 31.8 g/dL (ref 30.0–36.0)
Platelets: 209 10*3/uL (ref 150–400)
RBC: 4.12 MIL/uL — ABNORMAL LOW (ref 4.22–5.81)
RDW: 14.7 % (ref 11.5–15.5)

## 2011-03-14 LAB — DIFFERENTIAL
Eosinophils Absolute: 0.1 10*3/uL (ref 0.0–0.7)
Eosinophils Relative: 1 % (ref 0–5)
Lymphs Abs: 1.6 10*3/uL (ref 0.7–4.0)

## 2011-03-14 LAB — PRO B NATRIURETIC PEPTIDE: Pro B Natriuretic peptide (BNP): 103.1 pg/mL (ref 0–450)

## 2011-03-14 NOTE — Telephone Encounter (Signed)
[facility] Triage Call Report Triage Record Num: [id number] Operator: Edgar Frisk Patient Name: [name] [name] Call Date & Time: 03/14/2011 5:35:05AM Patient Phone: [phone] PCP: Illene Regulus Patient Gender: Male PCP Fax : [phone] Patient DOB: Nov 10, 1933 Practice Name: [facility] Reason for Call: Pt reports he woke with chest tightness and SOB this am. States also have fever. temp 101orallyt this am Advised 911 , pt agrees Protocol(s) Used: Chest Pain Recommended Outcome per Protocol: Activate EMS 911 Reason for Outcome: Pressure, fullness, squeezing sensation or pain anywhere in the chest lasting 5 or more minutes now or within the last hour. Pain is NOT associated with taking a deep breath or a productive cough, movement, or touch to a localized area on the chest. Care Advice: Write down provider's name. List or place the following in a bag for transport with the patient: current prescription and/or nonprescription medications; alternative treatments, therapies and medications; and street drugs. ~

## 2011-04-01 ENCOUNTER — Other Ambulatory Visit: Payer: Self-pay | Admitting: Internal Medicine

## 2011-04-18 ENCOUNTER — Telehealth: Payer: Self-pay | Admitting: *Deleted

## 2011-04-18 MED ORDER — AZITHROMYCIN 250 MG PO TABS
ORAL_TABLET | ORAL | Status: AC
Start: 2011-04-18 — End: 2011-04-23

## 2011-04-18 NOTE — Telephone Encounter (Signed)
Pt c/o fever 99-100 degrees x3 days, sore throat, cough, sinusitis, congestion w/o color as of yet. Request RX.  Please advise.

## 2011-04-18 NOTE — Telephone Encounter (Signed)
Called patient - sounds like bronchitis  Plan - z-pak

## 2011-05-11 ENCOUNTER — Ambulatory Visit (INDEPENDENT_AMBULATORY_CARE_PROVIDER_SITE_OTHER): Payer: Medicare Other | Admitting: Internal Medicine

## 2011-05-11 DIAGNOSIS — J069 Acute upper respiratory infection, unspecified: Secondary | ICD-10-CM

## 2011-05-11 DIAGNOSIS — R05 Cough: Secondary | ICD-10-CM

## 2011-05-11 MED ORDER — PROMETHAZINE-CODEINE 6.25-10 MG/5ML PO SYRP: 5.0000 mL | ORAL_SOLUTION | ORAL | Status: AC | PRN

## 2011-05-11 NOTE — Patient Instructions (Signed)
No sign of active bacterial infection on exam today. I think you are just a little slow to bounce back.  Reviewed all the work done in the ED October 29th - a very complete evaluation that was negative.

## 2011-05-11 NOTE — Progress Notes (Signed)
  Subjective:    Patient ID: [name] [name], male    DOB: December 22, 1933, 77 y.o.   MRN: [id number]  HPI Mr. Tucker was sick early in the month with fever and aches and sinus symptoms. A z-pak was called in 04/18/11 and he was taking phenergan w/ codiene. He is still feeling bad and thinks that this has gone to his chest.   He was in the ED in October '12 for chest pain, elevated BP and fever. Record reviewed- BNP normal, Cardiac enzymes normal x 2, EKG was normal, CXR was normal, EKG without acute changes, CBC, Bmet was normal. He was discharged home.    Review of Systems No SOB, no chest pain, no N/V/D, still coughing mostly at night. Sinus pressure has resolved. He has a decrease appetite.     Objective:   Physical Exam Vitals reveiwed - no fever Gen'l- obese AA man in no distress HEENT- C&S clear, no sinus tenderness to percussion Chest - good breath sounds w/o rales, wheezes or rhonchi, no increased WOB Cor- RRR Abd- obese, BS +, not tender       Assessment & Plan:  URI - slow to resolve but no evidence of active bacterial infection or need for additional antibiotics  Plan - supportive care; robitussin DM or equivalent for cough

## 2011-11-25 DIAGNOSIS — R319 Hematuria, unspecified: Secondary | ICD-10-CM | POA: Diagnosis not present

## 2011-11-25 DIAGNOSIS — Z8551 Personal history of malignant neoplasm of bladder: Secondary | ICD-10-CM | POA: Diagnosis not present

## 2011-11-26 ENCOUNTER — Other Ambulatory Visit: Payer: Self-pay

## 2011-11-26 MED ORDER — RANITIDINE HCL 150 MG PO CAPS: ORAL_CAPSULE | ORAL | Status: DC

## 2011-12-07 ENCOUNTER — Ambulatory Visit: Payer: Medicare Other | Admitting: Internal Medicine

## 2011-12-08 ENCOUNTER — Encounter: Payer: Self-pay | Admitting: Internal Medicine

## 2011-12-08 ENCOUNTER — Ambulatory Visit (INDEPENDENT_AMBULATORY_CARE_PROVIDER_SITE_OTHER): Payer: Medicare Other | Admitting: Internal Medicine

## 2011-12-08 VITALS — BP 142/92 | HR 76 | Temp 98.1°F | Resp 16 | Wt 247.0 lb

## 2011-12-08 DIAGNOSIS — E109 Type 1 diabetes mellitus without complications: Secondary | ICD-10-CM | POA: Diagnosis not present

## 2011-12-08 DIAGNOSIS — H6120 Impacted cerumen, unspecified ear: Secondary | ICD-10-CM | POA: Diagnosis not present

## 2011-12-08 DIAGNOSIS — C679 Malignant neoplasm of bladder, unspecified: Secondary | ICD-10-CM

## 2011-12-08 DIAGNOSIS — I10 Essential (primary) hypertension: Secondary | ICD-10-CM | POA: Diagnosis not present

## 2011-12-08 NOTE — Assessment & Plan Note (Signed)
Sub optimal control with A1C 7.8% at [place], but less than 8% threshold for medication change.  Plan- encouraged a better diet and regular walking.

## 2011-12-08 NOTE — Assessment & Plan Note (Signed)
Followed now by Dr. Mena Goes at [facility] - he is doing well.

## 2011-12-08 NOTE — Assessment & Plan Note (Signed)
BP Readings from Last 3 Encounters:  12/08/11 142/92  05/11/11 146/88  11/23/10 148/88   Suboptimal control on present medication.   Plan- will address medication adjustment at next visit.

## 2011-12-08 NOTE — Progress Notes (Signed)
Subjective:    Patient ID: [name] [name], male    DOB: 01/04/1934, 78 y.o.   MRN: [id number]  HPI [name] [name] presents for followup of diabetes and urology issues. He has seen Dr. Mena Goes at [facility] (Dr. Vonita Moss has retired) and got a good report. He had labs which are normal. He had lab at the [place] in April '13 - A1C 7.8%. He denies any hypoglycemic episodes or other complications related to his diabetes.  He reports that he is feeling pretty good. He does have an intermittent tremor, usually when he is trying to hold something. He had a flapping in his ear.   Past Medical History  Diagnosis Date  . ANXIETY 01/23/2007  . GERD 01/23/2007  . HYPERTENSION 01/23/2007  . BLADDER CANCER 01/23/2007  . ANGINA, HX OF 01/23/2007  . OSTEOARTHRITIS, KNEES, BILATERAL 08/29/2008  . OBESITY, CLASS III 08/30/2008   Past Surgical History  Procedure Date  . Appendectomy   . Prostatectomy     radical cystoprostatectomy with creation of ileal loop urinary diversion for bladder cancer   Family History  Problem Relation Age of Onset  . Diabetes Mother    History   Social History  . Marital Status: Widowed    Spouse Name: N/A    Number of Children: 2  . 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 Smoking status: Former Games developer  . Smokeless tobacco: Not on file  . Alcohol Use: Not on file  . Drug Use: Not on file  . Sexually Active: Not on file   Other Topics Concern  . Not on file   Social History Narrative    He has a grandson.Lives alone. [place]-ADLS, but doesn't make long trips.    Current Outpatient Prescriptions on File Prior to Visit  Medication Sig Dispense Refill  . acetaminophen (TYLENOL) 325 MG tablet Take 325 mg by mouth as needed.        Marland Kitchen aspirin 325 MG tablet Take 325 mg by mouth daily.        . insulin glargine (LANTUS) 100 UNIT/ML injection Inject 40 Units into the skin at bedtime.        .  insulin regular (NOVOLIN R) 100 units/mL injection Sliding scale as needed       . losartan (COZAAR) 50 MG tablet Take 50 mg by mouth 2 (two) times daily.        . magnesium hydroxide (MILK OF MAGNESIA) 400 MG/5ML suspension Take by mouth daily as needed.        . metFORMIN (GLUCOPHAGE) 1000 MG tablet Take 1,000 mg by mouth 2 (two) times daily with a meal.        . omeprazole (PRILOSEC) 20 MG capsule Take 20 mg by mouth 2 (two) times daily.        . ranitidine (ZANTAC) 150 MG capsule TAKE ONE CAPSULE BY MOUTH EVERY DAY  90 capsule  3  . simethicone (MYLICON) 80 MG chewable tablet Chew 80 mg by mouth as needed.        . simvastatin (ZOCOR) 40 MG tablet Take 40 mg by mouth daily.        Marland Kitchen tetrahydrozoline 0.05 % ophthalmic solution as needed.            Review of Systems System review is negative for any constitutional, cardiac, pulmonary, GI or neuro symptoms or complaints other than as  described in the HPI.     Objective:   Physical Exam Filed Vitals:   12/08/11 1325  BP: 142/92  Pulse: 76  Temp: 98.1 F (36.7 C)  Resp: 16   Wt Readings from Last 3 Encounters:  12/08/11 247 lb (112.038 kg)  05/11/11 245 lb (111.131 kg)  11/23/10 243 lb (110.224 kg)   Gen'l- obese AA man in no distress HEENT- cerumen impactions bilaterally. C&S clear Cor - RRR Pulm - normal respirations Abd- obese Neuro - A&O x 3, CN II-XII grossly normal.   Procedure Note :     Procedure :  Ear irrigation   Indication:  Cerumen impaction   Risks, including pain, dizziness, eardrum perforation, bleeding, infection and others as well as benefits were explained to the patient in detail. Verbal consent was obtained and the patient agreed to proceed.    We used Ear syringe filled with lukewarm water for irrigation. A large amount wax was recovered.    Tolerated well. Complications: None.   Postprocedure instructions :  Call if problems.         Assessment & Plan:  Cerumen impaction - resolved  with irrigation. Patient tolerated this well.

## 2012-01-26 ENCOUNTER — Ambulatory Visit (INDEPENDENT_AMBULATORY_CARE_PROVIDER_SITE_OTHER): Payer: Medicare Other | Admitting: *Deleted

## 2012-01-26 DIAGNOSIS — Z23 Encounter for immunization: Secondary | ICD-10-CM

## 2012-02-16 ENCOUNTER — Telehealth: Payer: Self-pay | Admitting: Internal Medicine

## 2012-02-16 NOTE — Telephone Encounter (Signed)
Pt stated that he has a sinus infection and pt was wondering if Dr. Debby Bud can give him some antibiotic? Please call pt if this is ok.

## 2012-02-16 NOTE — Telephone Encounter (Signed)
Need more information: fever, chills, sinus pain, drainage??

## 2012-02-17 NOTE — Telephone Encounter (Signed)
Left message on machine for pt to return my call  

## 2012-02-20 NOTE — Telephone Encounter (Signed)
Patient notified of medication prescribed by Dr. Debby Bud will be called in to [facility] pharmacy. Patient understands to call back if fever of pain persist.

## 2012-02-20 NOTE — Telephone Encounter (Signed)
Ok. Augmentin 875 mg bid x 7 days. Call if he has more pain or fever.

## 2012-02-20 NOTE — Telephone Encounter (Signed)
SPOKE WITH PATIENT AT HOME # STATES HE DID HAVE CHILLS/ FEVER LAST WEEK. HAS BEEN TAKING TYLENOL AND HAS HELPED WITH FEVER. ALSO SAYS NOW HE HAS EAR PAIN. SUGGESTED HE COME IN TODAY TO BE SEEN. PATIENT STATES HE CAN NOT AFFORD TO COME IN AND WAS REQUESTING ANTIBIOTIC BE CALLED IN. PLEASE ADVISE.

## 2012-05-02 ENCOUNTER — Encounter: Payer: Self-pay | Admitting: Internal Medicine

## 2012-05-02 ENCOUNTER — Ambulatory Visit (INDEPENDENT_AMBULATORY_CARE_PROVIDER_SITE_OTHER): Payer: Medicare Other | Admitting: Internal Medicine

## 2012-05-02 VITALS — BP 132/64 | HR 62 | Temp 98.1°F | Resp 12 | Wt 250.1 lb

## 2012-05-02 DIAGNOSIS — M25551 Pain in right hip: Secondary | ICD-10-CM

## 2012-05-02 DIAGNOSIS — E109 Type 1 diabetes mellitus without complications: Secondary | ICD-10-CM

## 2012-05-02 DIAGNOSIS — I10 Essential (primary) hypertension: Secondary | ICD-10-CM | POA: Diagnosis not present

## 2012-05-02 DIAGNOSIS — M25559 Pain in unspecified hip: Secondary | ICD-10-CM

## 2012-05-02 NOTE — Patient Instructions (Addendum)
Hip pain - tender with internal rotation of the right hip. Suspect osteoarthritis. The meloxicam should help. If it doesn't and you still can't walk will need to get xrays and consider an orthopedic consult for possible hip injection.   Diabetes  - need to review lab from the [place] and if A1C is not controlled help make adjustments  Blood in fluid - doubt this is of significance. If it continues this would a problem for the urologist.   Blood pressure is looking good.

## 2012-05-02 NOTE — Progress Notes (Signed)
  Subjective:    Patient ID: [name] [name], male    DOB: 08-21-1933, 78 y.o.   MRN: [id number]  HPI [name] [name] presents for interval follow up. He has been doing OK but does report that he has had scant blood with masturbation - some clear fluid. He is s/p prostatectomy. His ureterostomy is working well. He has continued on all his prior medications. He continues to follow at the [facility] for routine follow up and medications. He has been restarted on atenolol. He is generally feeling well. He has also been prescribed Meloxicam for pain.   He does report progressive joint discomfort especially right hip. He also has chronic back pain. He does have increased pain with walking.   Past Medical History  Diagnosis Date  . ANXIETY 01/23/2007  . GERD 01/23/2007  . HYPERTENSION 01/23/2007  . BLADDER CANCER 01/23/2007  . ANGINA, HX OF 01/23/2007  . OSTEOARTHRITIS, KNEES, BILATERAL 08/29/2008  . OBESITY, CLASS III 08/30/2008   Past Surgical History  Procedure Date  . Appendectomy   . Prostatectomy     radical cystoprostatectomy with creation of ileal loop urinary diversion for bladder cancer   Family History  Problem Relation Age of Onset  . Diabetes Mother    History   Social History  . Marital Status: Widowed    Spouse Name: N/A    Number of Children: 2  . 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 Smoking status: Former Games developer  . Smokeless tobacco: Not on file  . Alcohol Use: Not on file  . Drug Use: Not on file  . Sexually Active: Not on file   Other Topics Concern  . Not on file   Social History Narrative    He has a grandson.Lives alone. [place]AD[place], but doesn't make long trips.      Review of Systems System review is negative for any constitutional, cardiac, pulmonary, GI or neuro symptoms or complaints other than as described in the HPI.     Objective:   Physical Exam Filed Vitals:   05/02/12 1412   BP: 132/64  Pulse: 62  Temp: 98.1 F (36.7 C)  Resp: 12   Wt Readings from Last 3 Encounters:  05/02/12 250 lb 1.3 oz (113.436 kg)  12/08/11 247 lb (112.038 kg)  05/11/11 245 lb (111.131 kg)   Gen'l - obese AA man in no distress HEENT- C&S clear  Cor 2+ radial, RRR Pulm - CTAP MSK - right hip with tenderness with internal rotation, no pain with pressure against the greater trochanter; left hip with minimal tenderness with internal rotation.      Assessment & Plan:  Hip pain - tender with internal rotation of the right hip. Suspect osteoarthritis. The meloxicam should help. If it doesn't and you still can't walk will need to get xrays and consider an orthopedic consult for possible hip injection.

## 2012-05-06 NOTE — Assessment & Plan Note (Signed)
[name] [name] does follow at the [facility] wherehe has regular lab work. He is to obtain copies of lab including A1c with recommendations to follow based on A1C

## 2012-05-06 NOTE — Assessment & Plan Note (Signed)
BP Readings from Last 3 Encounters:  05/02/12 132/64  12/08/11 142/92  05/11/11 146/88   Fairly good control - no change in medication.

## 2012-11-05 ENCOUNTER — Ambulatory Visit (INDEPENDENT_AMBULATORY_CARE_PROVIDER_SITE_OTHER): Payer: Medicare Other | Admitting: Internal Medicine

## 2012-11-05 ENCOUNTER — Encounter: Payer: Self-pay | Admitting: Internal Medicine

## 2012-11-05 VITALS — BP 146/86 | HR 55 | Temp 98.0°F | Resp 12 | Ht 71.0 in | Wt 253.0 lb

## 2012-11-05 DIAGNOSIS — E785 Hyperlipidemia, unspecified: Secondary | ICD-10-CM | POA: Diagnosis not present

## 2012-11-05 DIAGNOSIS — E109 Type 1 diabetes mellitus without complications: Secondary | ICD-10-CM

## 2012-11-05 DIAGNOSIS — I10 Essential (primary) hypertension: Secondary | ICD-10-CM

## 2012-11-05 DIAGNOSIS — C679 Malignant neoplasm of bladder, unspecified: Secondary | ICD-10-CM

## 2012-11-05 NOTE — Progress Notes (Signed)
Subjective:    Patient ID: [name] [name], male    DOB: 1933/10/31, 79 y.o.   MRN: [id number]  HPI [name] [name] presents for medical follow up. He has had a good 6 months since his last visit in December '13. He did have two CT abdomen April and May: left adrenal adenoma - small, without change.  He has been monitoring his blood pressure and will frequently have elevations above 150.  He has a small abrasion on the distal left leg - slow to heal. No fever, no purulent drainage.   Reviewed Labs from [place]- October '13 - appeared normal. He had lab in May '14 - OK.   Past Medical History  Diagnosis Date  . ANXIETY 01/23/2007  . GERD 01/23/2007  . HYPERTENSION 01/23/2007  . BLADDER CANCER 01/23/2007  . ANGINA, HX OF 01/23/2007  . OSTEOARTHRITIS, KNEES, BILATERAL 08/29/2008  . OBESITY, CLASS III 08/30/2008   Past Surgical History  Procedure Laterality Date  . Appendectomy    . Prostatectomy      radical cystoprostatectomy with creation of ileal loop urinary diversion for bladder cancer   Family History  Problem Relation Age of Onset  . Diabetes Mother    History   Social History  . Marital Status: Widowed    Spouse Name: N/A    Number of Children: 2  . 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 Smoking status: Former Games developer  . Smokeless tobacco: Not on file  . Alcohol Use: Yes  . Drug Use: No  . Sexually Active: Not on file   Other Topics Concern  . Not on file   Social History Narrative    He has a grandson.   Lives alone. I-ADLS, but doesn't make long trips.    Current Outpatient Prescriptions on File Prior to Visit  Medication Sig Dispense Refill  . acetaminophen (TYLENOL) 325 MG tablet Take 325 mg by mouth as needed.        Marland Kitchen aspirin 325 MG tablet Take 325 mg by mouth daily.        Marland Kitchen atenolol (TENORMIN) 25 MG tablet Take 25 mg by mouth daily. Take one-half tablet by mouth once every day for  blood pressure.      . insulin glargine (LANTUS) 100 UNIT/ML injection Inject 45 Units into the skin at bedtime.       . insulin regular (NOVOLIN R) 100 units/mL injection Inject 5 Units into the skin 3 (three) times daily before meals. Sliding scale as needed      . losartan (COZAAR) 50 MG tablet Take 50 mg by mouth 2 (two) times daily.        . magnesium hydroxide (MILK OF MAGNESIA) 400 MG/5ML suspension Take by mouth daily as needed.        . metFORMIN (GLUCOPHAGE) 1000 MG tablet Take 1,000 mg by mouth 2 (two) times daily with a meal.        . omeprazole (PRILOSEC) 20 MG capsule Take 20 mg by mouth 2 (two) times daily.        . ranitidine (ZANTAC) 150 MG capsule TAKE ONE CAPSULE BY MOUTH EVERY DAY  90 capsule  3  . simethicone (MYLICON) 80 MG chewable tablet Chew 80 mg by mouth as needed.        . simvastatin (ZOCOR) 40 MG tablet Take 40 mg by mouth daily.        Marland Kitchen  tetrahydrozoline 0.05 % ophthalmic solution as needed.         No current facility-administered medications on file prior to visit.      Review of Systems System review is negative for any constitutional, cardiac, pulmonary, GI or neuro symptoms or complaints other than as described in the HPI.      Objective:   Physical Exam Filed Vitals:   11/05/12 1532  BP: 146/86  Pulse: 55  Temp: 98 F (36.7 C)  Resp: 12   Wt Readings from Last 3 Encounters:  11/05/12 253 lb (114.76 kg)  05/02/12 250 lb 1.3 oz (113.436 kg)  12/08/11 247 lb (112.038 kg)   BP Readings from Last 3 Encounters:  11/05/12 146/86  05/02/12 132/64  12/08/11 142/92   Gen'l- overweight AA man in no distress HEENT - C&S clear, edentulous Cor- 2+ radial pulse, RRR Pulm - normal respirations Neuro - A&O x 3, normal gait       Assessment & Plan:

## 2012-11-05 NOTE — Patient Instructions (Addendum)
1. Leg wound - minor abrasion. Plan` Wash with soap and water twice a day  Dry after washing and apply a thin coat of antibiotic ointment then cover with a small bandaide  2. Adrenal adenoma - the two CT scans reveal that there is a small tumor on the left adrenal glands that is most likely a benign adenoma that does not require any further work up at this time.  3. Blood pressure - running a little high at times. Plan Continue atenolol 25 mg once a day  Continue losartan 50 mg twice a day  If you [facility] agrees I recommend you add furosemide 20 mg once a day to bring down the top number on a consistent basis.  4. Diabetes -  Last A1C 7.4% Ocotber 23rd, 2014 - close enough to goal. No recommended change in medication  5. Blood in semen - the urology report was negative. No need for further evaluation at this time.  You overall seem to be doing ok. Please keep up with the exercise and calorie reduction. Your goal should be to loose 1-2 lbs per month. Diet management: smart food choices, PORTION SIZE CONTROL, regular exercise. Goal - to loose 1-2 lbs.month. Target weight - 220 l bs

## 2012-11-06 NOTE — Assessment & Plan Note (Signed)
Stable. Follows with [facility]. He has not had any continue hemospermia

## 2012-11-06 NOTE — Assessment & Plan Note (Signed)
BP Readings from Last 3 Encounters:  11/05/12 146/86  05/02/12 132/64  12/08/11 142/92   Borderline control.  Plan No change in medication but recommend to his [place] doctor that he add a diuretic for better control.

## 2012-11-06 NOTE — Assessment & Plan Note (Signed)
Last lab from [place] with A1C 7.4% - adequate control: no change in medications at this time.   Plan Better diet management  Regular exercise  Weight loss

## 2012-11-06 NOTE — Assessment & Plan Note (Signed)
Last lab from [place] revealed adequate control. He tolerates simvastatin well and liver functions were normal.   Plan Continue present medication and weight management

## 2012-11-06 NOTE — Assessment & Plan Note (Signed)
Weight continues to be a problem - has gone up since his last exam. Stressed the importance of weight mangement  Plan Continue to make smart choices, limit portions and exercise  Goal - to loose 1-2 lbs per month.

## 2012-11-27 DIAGNOSIS — R319 Hematuria, unspecified: Secondary | ICD-10-CM | POA: Diagnosis not present

## 2012-11-27 DIAGNOSIS — Z8551 Personal history of malignant neoplasm of bladder: Secondary | ICD-10-CM | POA: Diagnosis not present

## 2012-12-25 ENCOUNTER — Other Ambulatory Visit: Payer: Self-pay | Admitting: Internal Medicine

## 2012-12-31 ENCOUNTER — Emergency Department (HOSPITAL_COMMUNITY)
Admission: EM | Admit: 2012-12-31 | Discharge: 2013-01-01 | Disposition: A | Discharge status: Home / Self Care | Payer: Medicare Other | Attending: Emergency Medicine | Admitting: Emergency Medicine

## 2012-12-31 ENCOUNTER — Emergency Department (HOSPITAL_COMMUNITY): Payer: Medicare Other

## 2012-12-31 ENCOUNTER — Encounter (HOSPITAL_COMMUNITY): Payer: Self-pay | Admitting: *Deleted

## 2012-12-31 DIAGNOSIS — F411 Generalized anxiety disorder: Secondary | ICD-10-CM | POA: Insufficient documentation

## 2012-12-31 DIAGNOSIS — R059 Cough, unspecified: Secondary | ICD-10-CM | POA: Insufficient documentation

## 2012-12-31 DIAGNOSIS — Z8551 Personal history of malignant neoplasm of bladder: Secondary | ICD-10-CM | POA: Diagnosis not present

## 2012-12-31 DIAGNOSIS — R509 Fever, unspecified: Secondary | ICD-10-CM | POA: Diagnosis not present

## 2012-12-31 DIAGNOSIS — R42 Dizziness and giddiness: Secondary | ICD-10-CM | POA: Diagnosis not present

## 2012-12-31 DIAGNOSIS — K219 Gastro-esophageal reflux disease without esophagitis: Secondary | ICD-10-CM | POA: Insufficient documentation

## 2012-12-31 DIAGNOSIS — I10 Essential (primary) hypertension: Secondary | ICD-10-CM | POA: Insufficient documentation

## 2012-12-31 DIAGNOSIS — Z7982 Long term (current) use of aspirin: Secondary | ICD-10-CM | POA: Insufficient documentation

## 2012-12-31 DIAGNOSIS — Z8679 Personal history of other diseases of the circulatory system: Secondary | ICD-10-CM | POA: Diagnosis not present

## 2012-12-31 DIAGNOSIS — Z794 Long term (current) use of insulin: Secondary | ICD-10-CM | POA: Diagnosis not present

## 2012-12-31 DIAGNOSIS — Z87891 Personal history of nicotine dependence: Secondary | ICD-10-CM | POA: Diagnosis not present

## 2012-12-31 DIAGNOSIS — N281 Cyst of kidney, acquired: Secondary | ICD-10-CM | POA: Diagnosis not present

## 2012-12-31 DIAGNOSIS — R0602 Shortness of breath: Secondary | ICD-10-CM | POA: Diagnosis not present

## 2012-12-31 DIAGNOSIS — R110 Nausea: Secondary | ICD-10-CM | POA: Diagnosis not present

## 2012-12-31 DIAGNOSIS — R61 Generalized hyperhidrosis: Secondary | ICD-10-CM | POA: Insufficient documentation

## 2012-12-31 DIAGNOSIS — J069 Acute upper respiratory infection, unspecified: Secondary | ICD-10-CM | POA: Insufficient documentation

## 2012-12-31 DIAGNOSIS — Z79899 Other long term (current) drug therapy: Secondary | ICD-10-CM | POA: Diagnosis not present

## 2012-12-31 DIAGNOSIS — R5381 Other malaise: Secondary | ICD-10-CM | POA: Diagnosis not present

## 2012-12-31 DIAGNOSIS — E119 Type 2 diabetes mellitus without complications: Secondary | ICD-10-CM | POA: Diagnosis not present

## 2012-12-31 DIAGNOSIS — R05 Cough: Secondary | ICD-10-CM | POA: Insufficient documentation

## 2012-12-31 MED ORDER — SODIUM CHLORIDE 0.9 % IV BOLUS (SEPSIS)
1000.0000 mL | Freq: Once | INTRAVENOUS | Status: AC
  Administered 2013-01-01: 1000 mL via INTRAVENOUS

## 2012-12-31 MED ORDER — IBUPROFEN 200 MG PO TABS
600.0000 mg | ORAL_TABLET | Freq: Once | ORAL | Status: AC
  Administered 2013-01-01: 600 mg via ORAL
  Filled 2012-12-31: qty 3

## 2012-12-31 NOTE — ED Provider Notes (Signed)
CSN: [id number]     Arrival date & time 12/31/12  2324 History     First MD Initiated Contact with Patient 12/31/12 2343     Chief Complaint  Patient presents with  . Shortness of Breath  . Cough  . Fever   (Consider location/radiation/quality/duration/timing/severity/associated sxs/prior Treatment) HPI Comments: 79 y/o male with hx of DM, HTN, and bladder CA who presents for dry, nonproductive cough x 4 days. Symptoms have been worsening since onset and are associated with fever, chills, diaphoresis, lightheadedness, shortness of breath, and nausea. He has taken Tylenol for symptoms without relief. He denies associated syncope, substernal chest pain, vomiting, abdominal pain, urinary symptoms, melena or hematochezia, and numbness/tingling in his extremities. Patient denies sick contacts and hospitalizations within the past 6 months. PCP - Dr. Debby Bud  Patient is a 79 y.o. male presenting with shortness of breath, cough, and fever. The history is provided by the patient. No language interpreter was used.  Shortness of Breath Associated symptoms: cough and fever   Associated symptoms: no abdominal pain, no chest pain, no rash and no vomiting   Cough Associated symptoms: fever and shortness of breath   Associated symptoms: no chest pain and no rash   Fever Associated symptoms: cough and nausea   Associated symptoms: no chest pain, no dysuria, no rash and no vomiting     Past Medical History  Diagnosis Date  . ANXIETY 01/23/2007  . GERD 01/23/2007  . HYPERTENSION 01/23/2007  . BLADDER CANCER 01/23/2007  . ANGINA, HX OF 01/23/2007  . OSTEOARTHRITIS, KNEES, BILATERAL 08/29/2008  . OBESITY, CLASS III 08/30/2008   Past Surgical History  Procedure Laterality Date  . Appendectomy    . Prostatectomy      radical cystoprostatectomy with creation of ileal loop urinary diversion for bladder cancer   Family History  Problem Relation Age of Onset  . Diabetes Mother    History  Substance Use  Topics  . Smoking status: Former Games developer  . Smokeless tobacco: Not on file  . Alcohol Use: Yes    Review of Systems  Constitutional: Positive for fever and fatigue.  Respiratory: Positive for cough and shortness of breath.   Cardiovascular: Negative for chest pain.  Gastrointestinal: Positive for nausea. Negative for vomiting, abdominal pain and blood in stool.  Genitourinary: Negative for dysuria and hematuria.  Skin: Negative for rash.  Neurological: Positive for light-headedness. Negative for syncope.  All other systems reviewed and are negative.   Allergies  Lisinopril  Home Medications   Current Outpatient Rx  Name  Route  Sig  Dispense  Refill  . acetaminophen (TYLENOL) 325 MG tablet   Oral   Take 325 mg by mouth as needed.           Marland Kitchen aspirin 325 MG tablet   Oral   Take 325 mg by mouth daily.           Marland Kitchen atenolol (TENORMIN) 25 MG tablet   Oral   Take 25 mg by mouth daily. Take one-half tablet by mouth once every day for blood pressure.         . insulin glargine (LANTUS) 100 UNIT/ML injection   Subcutaneous   Inject 45 Units into the skin at bedtime.          . insulin regular (NOVOLIN R) 100 units/mL injection   Subcutaneous   Inject 5 Units into the skin 3 (three) times daily before meals. Sliding scale as needed         .  ketotifen (ZADITOR) 0.025 % ophthalmic solution   Both Eyes   Place 1 drop into both eyes 2 (two) times daily as needed (irritation).         Marland Kitchen losartan (COZAAR) 50 MG tablet   Oral   Take 50 mg by mouth 2 (two) times daily.           . magnesium hydroxide (MILK OF MAGNESIA) 400 MG/5ML suspension   Oral   Take by mouth daily as needed.           . metFORMIN (GLUCOPHAGE) 1000 MG tablet   Oral   Take 1,000 mg by mouth 2 (two) times daily with a meal.           . omeprazole (PRILOSEC) 20 MG capsule   Oral   Take 20 mg by mouth at bedtime.          . polyvinyl alcohol (LIQUIFILM TEARS) 1.4 % ophthalmic  solution   Both Eyes   Place 1 drop into both eyes as needed (dry eyes).         . ranitidine (ZANTAC) 150 MG capsule      TAKE ONE CAPSULE BY MOUTH EVERY DAY   90 capsule   3   . simethicone (MYLICON) 80 MG chewable tablet   Oral   Chew 80 mg by mouth as needed.           . simvastatin (ZOCOR) 80 MG tablet   Oral   Take 40 mg by mouth at bedtime. Take half of 80mg  tab to =40mg          . tetrahydrozoline 0.05 % ophthalmic solution      as needed.            BP 165/77  Pulse 69  Temp(Src) 100.8 F (38.2 C) (Oral)  Resp 22  Ht 5\' 8"  (1.727 m)  Wt 242 lb (109.77 kg)  BMI 36.8 kg/m2  SpO2 96%  Physical Exam  Nursing note and vitals reviewed. Constitutional: He is oriented to person, place, and time. He appears well-developed and well-nourished. No distress.  HENT:  Head: Normocephalic and atraumatic.  Mouth/Throat: Oropharynx is clear and moist. No oropharyngeal exudate.  Eyes: Conjunctivae and EOM are normal. No scleral icterus.  Neck: Normal range of motion.  Cardiovascular: Normal rate, regular rhythm, normal heart sounds and intact distal pulses.   Pulmonary/Chest: Effort normal and breath sounds normal. No respiratory distress. He has no wheezes. He has no rales.  No rales appreciated on auscultation  Abdominal: Soft. There is tenderness (mild TTP in epigastrium and in R abdomen). There is no rebound, no guarding, no tenderness at McBurney's point and negative Murphy's sign.    Soft, morbidly obese abdomen. No peritoneal signs or evidence of acute surgical abdomen. Urinary pouch in place without evidence of surrounding cellulitis or induration. Urine clear and nonbloody.  Musculoskeletal: Normal range of motion.  Neurological: He is alert and oriented to person, place, and time.  Skin: Skin is warm and dry. No rash noted. He is not diaphoretic. No erythema. No pallor.  Psychiatric: He has a normal mood and affect. His behavior is normal.    ED Course    Procedures (including critical care time)  Labs Reviewed  CBC WITH DIFFERENTIAL - Abnormal; Notable for the following:    RBC 4.08 (*)    Hemoglobin 11.4 (*)    HCT 36.2 (*)    All other components within normal limits  CG4 I-STAT (LACTIC ACID) - Abnormal;  Notable for the following:    Lactic Acid, Venous 2.26 (*)    All other components within normal limits  BASIC METABOLIC PANEL  URINALYSIS, ROUTINE W REFLEX MICROSCOPIC  HEPATIC FUNCTION PANEL  LIPASE, BLOOD   Dg Chest 2 View  01/01/2013   *RADIOLOGY REPORT*  Clinical Data: Shortness of breath  CHEST - 2 VIEW  Comparison: 03/14/2011  Findings: The heart size and pulmonary vascularity are normal. The lungs appear clear and expanded without focal air space disease or consolidation. No blunting of the costophrenic angles.  No pneumothorax.  Mediastinal contours appear intact.  There is mild thoracic scoliosis convex towards the right.  Calcification and torsion of the aorta.  Degenerative changes in the spine.  No significant change since previous study.  IMPRESSION: No evidence of active pulmonary disease.  Scoliosis and degenerative changes in the thoracic spine.   Original Report Authenticated By: Burman Nieves, M.D.    Date: 01/01/2013  Rate: 69  Rhythm: normal sinus rhythm  QRS Axis: normal  Intervals: normal  ST/T Wave abnormalities: abnormal T waves; diffuse leads  Conduction Disutrbances:none  Narrative Interpretation: NSR without STEMI  Old EKG Reviewed: unchanged from 03/14/2011 I have personally reviewed and interpreted this EKG  Impression: 1. Fever  MDM  79 year old male who presents for fever with associated shortness of breath and cough x4 days. Symptoms have been progressively worsening since onset. Physical exam findings as above. Patient is hemodynamically stable. EKG unchanged from prior. Chest x-ray obtained which shows no evidence of pneumonia, pneumothorax, or pleural effusion; no other acute  cardiopulmonary abnormalities noted. Further workup to include CBG, CBC, BMP, hepatic function, lipase, troponin, lactic acid, UA, and CT abdomen/pelvis with contrast.  Patient signed out to Elpidio Anis, PA-C at shift change with labs and imaging pending for further evaluation and dispo.       Antony Madura, PA-C 01/01/13 [phone]

## 2012-12-31 NOTE — ED Notes (Signed)
Pt states about 2 days ago started having sob, cough and fever with chills. Pt was due to go to [facility] hospital but was told to come to ED for symptoms.

## 2013-01-01 ENCOUNTER — Encounter (HOSPITAL_COMMUNITY): Payer: Self-pay

## 2013-01-01 ENCOUNTER — Other Ambulatory Visit: Payer: Self-pay

## 2013-01-01 ENCOUNTER — Emergency Department (HOSPITAL_COMMUNITY): Payer: Medicare Other

## 2013-01-01 DIAGNOSIS — R0602 Shortness of breath: Secondary | ICD-10-CM | POA: Diagnosis not present

## 2013-01-01 DIAGNOSIS — N281 Cyst of kidney, acquired: Secondary | ICD-10-CM | POA: Diagnosis not present

## 2013-01-01 LAB — URINALYSIS, ROUTINE W REFLEX MICROSCOPIC
Bilirubin Urine: NEGATIVE
Ketones, ur: NEGATIVE mg/dL
Nitrite: NEGATIVE
Specific Gravity, Urine: 1.014 (ref 1.005–1.030)
pH: 7 (ref 5.0–8.0)

## 2013-01-01 LAB — URINE MICROSCOPIC-ADD ON

## 2013-01-01 LAB — HEPATIC FUNCTION PANEL
ALT: 15 U/L (ref 0–53)
AST: 18 U/L (ref 0–37)
Albumin: 3.3 g/dL — ABNORMAL LOW (ref 3.5–5.2)
Total Protein: 7.7 g/dL (ref 6.0–8.3)

## 2013-01-01 LAB — CG4 I-STAT (LACTIC ACID): Lactic Acid, Venous: 2.26 mmol/L — ABNORMAL HIGH (ref 0.5–2.2)

## 2013-01-01 LAB — BASIC METABOLIC PANEL
BUN: 10 mg/dL (ref 6–23)
Creatinine, Ser: 1.15 mg/dL (ref 0.50–1.35)
GFR calc Af Amer: 69 mL/min — ABNORMAL LOW (ref >90)
GFR calc non Af Amer: 60 mL/min — ABNORMAL LOW (ref >90)
Glucose, Bld: 183 mg/dL — ABNORMAL HIGH (ref 70–99)

## 2013-01-01 LAB — CBC WITH DIFFERENTIAL/PLATELET
Basophils Relative: 0 % (ref 0–1)
Eosinophils Absolute: 0.1 10*3/uL (ref 0.0–0.7)
HCT: 36.2 % — ABNORMAL LOW (ref 39.0–52.0)
Hemoglobin: 11.4 g/dL — ABNORMAL LOW (ref 13.0–17.0)
Lymphs Abs: 1.5 10*3/uL (ref 0.7–4.0)
MCH: 27.9 pg (ref 26.0–34.0)
MCHC: 31.5 g/dL (ref 30.0–36.0)
MCV: 88.7 fL (ref 78.0–100.0)
Monocytes Absolute: 0.4 10*3/uL (ref 0.1–1.0)
Monocytes Relative: 6 % (ref 3–12)
RBC: 4.08 MIL/uL — ABNORMAL LOW (ref 4.22–5.81)

## 2013-01-01 LAB — GLUCOSE, CAPILLARY: Glucose-Capillary: 184 mg/dL — ABNORMAL HIGH (ref 70–99)

## 2013-01-01 LAB — POCT I-STAT TROPONIN I: Troponin i, poc: 0.03 ng/mL (ref 0.00–0.08)

## 2013-01-01 MED ORDER — LEVOFLOXACIN 500 MG PO TABS: 500.0000 mg | ORAL_TABLET | Freq: Every day | ORAL | Status: DC

## 2013-01-01 MED ORDER — IOHEXOL 300 MG/ML  SOLN
50.0000 mL | Freq: Once | INTRAMUSCULAR | Status: AC | PRN
  Administered 2013-01-01: 50 mL via ORAL

## 2013-01-01 MED ORDER — LEVOFLOXACIN 500 MG PO TABS
500.0000 mg | ORAL_TABLET | Freq: Once | ORAL | Status: AC
  Administered 2013-01-01: 500 mg via ORAL
  Filled 2013-01-01: qty 1

## 2013-01-01 MED ORDER — IOHEXOL 300 MG/ML  SOLN
100.0000 mL | Freq: Once | INTRAMUSCULAR | Status: AC | PRN
  Administered 2013-01-01: 100 mL via INTRAVENOUS

## 2013-01-01 NOTE — ED Provider Notes (Signed)
Patient care transferred to me by Antony Madura with abdominal CT pending. He presented with febrile illness and cough for 3 days. CXR was negative for pneumonia and CT was ordered for evaluation of abdominal pain thought secondary to cough, and for further evaluation as to source of fever. CT negative for acute process to explain fever. He will be treated with abx for URI, febrile illness because of significant comorbidities as well as age. All results discussed with the patient. All questions answered. He is stable for discharge.   Arnoldo Hooker, PA-C 01/01/13 [phone]

## 2013-01-01 NOTE — ED Notes (Signed)
Ambulated self to restroom and back  Tolerated well  Back to bed monitor put back on  PA in to see pt

## 2013-01-01 NOTE — ED Notes (Signed)
MD at bedside. 

## 2013-01-01 NOTE — ED Provider Notes (Signed)
Medical screening examination/treatment/procedure(s) were performed by non-physician practitioner and as supervising physician I was immediately available for consultation/collaboration.    Gilda Crease, MD 01/01/13 [phone]

## 2013-01-01 NOTE — ED Provider Notes (Signed)
Medical screening examination/treatment/procedure(s) were performed by non-physician practitioner and as supervising physician I was immediately available for consultation/collaboration.  Patient presents with fever, cough, sore for several days. Cough has been nonproductive. Chest x-ray did not show any evidence of pneumonia. Remainder of workup is unremarkable. Patient to be treated empirically with antibiotics as an outpatient followup with primary.  Gilda Crease, MD 01/01/13 [phone]

## 2013-01-01 NOTE — ED Notes (Signed)
Patient transported to CT 

## 2013-01-01 NOTE — ED Notes (Signed)
Returned from CT.

## 2013-01-05 LAB — URINE CULTURE: Colony Count: >=100000

## 2013-01-06 ENCOUNTER — Telehealth (HOSPITAL_COMMUNITY): Payer: Self-pay | Admitting: Emergency Medicine

## 2013-01-06 NOTE — ED Notes (Signed)
Post ED Visit - Positive Culture Follow-up  Culture report reviewed by antimicrobial stewardship pharmacist: []  Wes Dulaney, Pharm.D., BCPS []  Celedonio Miyamoto, Pharm.D., BCPS [x]  Georgina Pillion, Pharm.D., BCPS []  [place], [address].D., BCPS, AAHIVP []  Estella Husk, Pharm.D., BCPS, AAHIVP  Positive urine culture Treated with Levofloxacin, organism sensitive to the same and no further patient follow-up is required at this time.  Kylie A Holland 01/06/2013, 2:31 PM

## 2013-01-10 ENCOUNTER — Encounter: Payer: Self-pay | Admitting: Internal Medicine

## 2013-01-10 ENCOUNTER — Ambulatory Visit (INDEPENDENT_AMBULATORY_CARE_PROVIDER_SITE_OTHER): Payer: Medicare Other | Admitting: Internal Medicine

## 2013-01-10 VITALS — BP 146/80 | HR 66 | Temp 98.1°F | Wt 247.0 lb

## 2013-01-10 DIAGNOSIS — J069 Acute upper respiratory infection, unspecified: Secondary | ICD-10-CM

## 2013-01-10 MED ORDER — PROMETHAZINE-CODEINE 6.25-10 MG/5ML PO SYRP: 5.0000 mL | ORAL_SOLUTION | ORAL | Status: DC | PRN

## 2013-01-10 NOTE — Patient Instructions (Addendum)
URI - maybe bronchitis but no fever and course of powerful antibiotics (levaquin) completed.  Plan  Phenergan/cod cough syrup 1 tsp every 4-6 hours as needed  Mucinex DM 1200 mg twice a day  Tylenol 1,000 mg three times a day  Hydrate  Nutrition is important.  Upper Respiratory Infection, Adult An upper respiratory infection (URI) is also sometimes known as the common cold. The upper respiratory tract includes the nose, sinuses, throat, trachea, and bronchi. Bronchi are the airways leading to the lungs. Most people improve within 1 week, but symptoms can last up to 2 weeks. A residual cough may last even longer.  CAUSES Many different viruses can infect the tissues lining the upper respiratory tract. The tissues become irritated and inflamed and often become very moist. Mucus production is also common. A cold is contagious. You can easily spread the virus to others by oral contact. This includes kissing, sharing a glass, coughing, or sneezing. Touching your mouth or nose and then touching a surface, which is then touched by another person, can also spread the virus. SYMPTOMS  Symptoms typically develop 1 to 3 days after you come in contact with a cold virus. Symptoms vary from person to person. They may include:  Runny nose.  Sneezing.  Nasal congestion.  Sinus irritation.  Sore throat.  Loss of voice (laryngitis).  Cough.  Fatigue.  Muscle aches.  Loss of appetite.  Headache.  Low-grade fever. DIAGNOSIS  You might diagnose your own cold based on familiar symptoms, since most people get a cold 2 to 3 times a year. Your caregiver can confirm this based on your exam. Most importantly, your caregiver can check that your symptoms are not due to another disease such as strep throat, sinusitis, pneumonia, asthma, or epiglottitis. Blood tests, throat tests, and X-rays are not necessary to diagnose a common cold, but they may sometimes be helpful in excluding other more serious  diseases. Your caregiver will decide if any further tests are required. RISKS AND COMPLICATIONS  You may be at risk for a more severe case of the common cold if you smoke cigarettes, have chronic heart disease (such as heart failure) or lung disease (such as asthma), or if you have a weakened immune system. The very young and very old are also at risk for more serious infections. Bacterial sinusitis, middle ear infections, and bacterial pneumonia can complicate the common cold. The common cold can worsen asthma and chronic obstructive pulmonary disease (COPD). Sometimes, these complications can require emergency medical care and may be life-threatening. PREVENTION  The best way to protect against getting a cold is to practice good hygiene. Avoid oral or hand contact with people with cold symptoms. Wash your hands often if contact occurs. There is no clear evidence that vitamin C, vitamin E, echinacea, or exercise reduces the chance of developing a cold. However, it is always recommended to get plenty of rest and practice good nutrition. TREATMENT  Treatment is directed at relieving symptoms. There is no cure. Antibiotics are not effective, because the infection is caused by a virus, not by bacteria. Treatment may include:  Increased fluid intake. Sports drinks offer valuable electrolytes, sugars, and fluids.  Breathing heated mist or steam (vaporizer or shower).  Eating chicken soup or other clear broths, and maintaining good nutrition.  Getting plenty of rest.  Using gargles or lozenges for comfort.  Controlling fevers with ibuprofen or acetaminophen as directed by your caregiver.  Increasing usage of your inhaler if you have asthma. Zinc  gel and zinc lozenges, taken in the first 24 hours of the common cold, can shorten the duration and lessen the severity of symptoms. Pain medicines may help with fever, muscle aches, and throat pain. A variety of non-prescription medicines are available to  treat congestion and runny nose. Your caregiver can make recommendations and may suggest nasal or lung inhalers for other symptoms.  HOME CARE INSTRUCTIONS   Only take over-the-counter or prescription medicines for pain, discomfort, or fever as directed by your caregiver.  Use a warm mist humidifier or inhale steam from a shower to increase air moisture. This may keep secretions moist and make it easier to breathe.  Drink enough water and fluids to keep your urine clear or pale yellow.  Rest as needed.  Return to work when your temperature has returned to normal or as your caregiver advises. You may need to stay home longer to avoid infecting others. You can also use a face mask and careful hand washing to prevent spread of the virus. SEEK MEDICAL CARE IF:   After the first few days, you feel you are getting worse rather than better.  You need your caregiver's advice about medicines to control symptoms.  You develop chills, worsening shortness of breath, or brown or red sputum. These may be signs of pneumonia.  You develop yellow or brown nasal discharge or pain in the face, especially when you bend forward. These may be signs of sinusitis.  You develop a fever, swollen neck glands, pain with swallowing, or white areas in the back of your throat. These may be signs of strep throat. SEEK IMMEDIATE MEDICAL CARE IF:   You have a fever.  You develop severe or persistent headache, ear pain, sinus pain, or chest pain.  You develop wheezing, a prolonged cough, cough up blood, or have a change in your usual mucus (if you have chronic lung disease).  You develop sore muscles or a stiff neck. Document Released: 10/26/2000 Document Revised: 07/25/2011 Document Reviewed: 09/03/2010 [facility] Patient Information 2014 [place], [place].

## 2013-01-10 NOTE — Progress Notes (Signed)
Subjective:    Patient ID: [name] [name], male    DOB: 1934/04/03, 79 y.o.   MRN: [id number]  HPI [name] [name] was seen at [facility] ED for respiratory infection. Reviewed the record: presented with persistent cough and temp of 103. CXR was negative for CAP, CT chest/abdomen was negative. CBCD was normal, CMet was normal. He was diagnosed with bronchitis and started on Levaquin 500 mg daily which he got filled at the [place]. He is also taking phenrgan w/ codeine but he continues to cough and to feel week. He reports that the fever broke. Admits to SOB, anorexia. No n/v but did have loose stools.  Past Medical History  Diagnosis Date  . ANXIETY 01/23/2007  . GERD 01/23/2007  . HYPERTENSION 01/23/2007  . BLADDER CANCER 01/23/2007  . ANGINA, HX OF 01/23/2007  . OSTEOARTHRITIS, KNEES, BILATERAL 08/29/2008  . OBESITY, CLASS III 08/30/2008   Past Surgical History  Procedure Laterality Date  . Appendectomy    . Prostatectomy      radical cystoprostatectomy with creation of ileal loop urinary diversion for bladder cancer   Family History  Problem Relation Age of Onset  . Diabetes Mother    History   Social History  . Marital Status: Widowed    Spouse Name: N/A    Number of Children: 2  . 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 Smoking status: Former Games developer  . Smokeless tobacco: Not on file  . Alcohol Use: Yes  . Drug Use: No  . Sexual Activity: Not on file   Other Topics Concern  . Not on file   Social History Narrative    He has a grandson.   Lives alone. [place], but doesn't make long trips.    Current Outpatient Prescriptions on File Prior to Visit  Medication Sig Dispense Refill  . acetaminophen (TYLENOL) 325 MG tablet Take 325 mg by mouth as needed.        Marland Kitchen aspirin 325 MG tablet Take 325 mg by mouth daily.        Marland Kitchen atenolol (TENORMIN) 25 MG tablet Take 25 mg by mouth daily. Take one-half tablet by  mouth once every day for blood pressure.      . insulin glargine (LANTUS) 100 UNIT/ML injection Inject 45 Units into the skin at bedtime.       . insulin regular (NOVOLIN R) 100 units/mL injection Inject 5 Units into the skin 3 (three) times daily before meals. Sliding scale as needed      . ketotifen (ZADITOR) 0.025 % ophthalmic solution Place 1 drop into both eyes 2 (two) times daily as needed (irritation).      Marland Kitchen levofloxacin (LEVAQUIN) 500 MG tablet Take 1 tablet (500 mg total) by mouth daily.  6 tablet  0  . losartan (COZAAR) 50 MG tablet Take 50 mg by mouth 2 (two) times daily.        . magnesium hydroxide (MILK OF MAGNESIA) 400 MG/5ML suspension Take by mouth daily as needed.        . metFORMIN (GLUCOPHAGE) 1000 MG tablet Take 1,000 mg by mouth 2 (two) times daily with a meal.        . omeprazole (PRILOSEC) 20 MG capsule Take 20 mg by mouth at bedtime.       . polyvinyl alcohol (LIQUIFILM TEARS) 1.4 % ophthalmic solution Place 1 drop into both eyes  as needed (dry eyes).      . ranitidine (ZANTAC) 150 MG capsule TAKE ONE CAPSULE BY MOUTH EVERY DAY  90 capsule  3  . simethicone (MYLICON) 80 MG chewable tablet Chew 80 mg by mouth as needed.        . simvastatin (ZOCOR) 80 MG tablet Take 40 mg by mouth at bedtime. Take half of 80mg  tab to =40mg       . tetrahydrozoline 0.05 % ophthalmic solution as needed.         No current facility-administered medications on file prior to visit.       Review of Systems System review is negative for any constitutional, cardiac, pulmonary, GI or neuro symptoms or complaints other than as described in the HPI.     Objective:   Physical Exam Filed Vitals:   01/10/13 1601  BP: 146/80  Pulse: 66  Temp: 98.1 F (36.7 C)   O2 Sat  96% on RA  Gen'l - obese AA man with cough but in no distress HEENT- normal Cor- 2+ radial, RRR Pulm - no increased WOB, lungs - CTAP, w/o rales or wheezing Abdomen - obese soft       Assessment & Plan:  URI -  maybe bronchitis but no fever and course of powerful antibiotics (levaquin) completed.  Plan  Phenergan/cod cough syrup 1 tsp every 4-6 hours as needed  Mucinex DM 1200 mg twice a day  Tylenol 1,000 mg three times a day  Hydrate  Nutrition is important.

## 2013-01-15 DIAGNOSIS — R319 Hematuria, unspecified: Secondary | ICD-10-CM | POA: Diagnosis not present

## 2013-01-15 DIAGNOSIS — C680 Malignant neoplasm of urethra: Secondary | ICD-10-CM | POA: Diagnosis not present

## 2013-01-17 ENCOUNTER — Encounter (HOSPITAL_BASED_OUTPATIENT_CLINIC_OR_DEPARTMENT_OTHER): Payer: Self-pay | Admitting: *Deleted

## 2013-01-17 ENCOUNTER — Other Ambulatory Visit: Payer: Self-pay | Admitting: Urology

## 2013-01-18 ENCOUNTER — Encounter (HOSPITAL_BASED_OUTPATIENT_CLINIC_OR_DEPARTMENT_OTHER): Payer: Self-pay | Admitting: *Deleted

## 2013-01-18 NOTE — Progress Notes (Signed)
01/18/13 1059  OBSTRUCTIVE SLEEP APNEA  Have you ever been diagnosed with sleep apnea through a sleep study? No  Do you snore loudly (loud enough to be heard through closed doors)?  0  Do you often feel tired, fatigued, or sleepy during the daytime? 0  Has anyone observed you stop breathing during your sleep? 0  Do you have, or are you being treated for high blood pressure? 1  BMI more than 35 kg/m2? 1  Age over 77 years old? 1  Neck circumference greater than 40 cm/18 inches? 0  Gender: 1  Obstructive Sleep Apnea Score 4  Score 4 or greater  Results sent to PCP

## 2013-01-18 NOTE — Progress Notes (Addendum)
NPO AFTER MN. ARRIVES AT 0600. NEEDS ISTAT. CURRENT EKG and CXR  IN EPIC AND CHART. WILL TAKE ATENOLOL AND LOSARTAN AM OF SURG W/ SIP OF WATER AND DO HALF DOSE OF LANTUS HS BEFORE DOS. PT AWARE HE IS HAVING GEN. ANES. AND NEEDS RESPONSIBLE PERSON TO TAKE HIM HOME AND BE WITH HIM.  HE STATES MAY DRIVE HIMSELF ARRIVAL AND HIS FRIEND LEON BRUTON  [phone], WILL COME PICK-UP LATER.

## 2013-01-21 NOTE — H&P (Signed)
History of Present Illness                       He is here with a history of a radical cystectomy and ileal loop urinary diversion in 2000 for bladder cancer, adrenal adenomas.  -Jun 2013 normal CMP --April 2014 CT [facility] of the abdomen and [facility] normal GU tract apart from a 2.4 x 2 cm left adrenal nodule. -May 2014 CT of the adrenals consistent with adrenal adenoma Also, no sign of bladder Ca recurrence in pelvis -Jul 2014 pink - reddish urethral discharge  [name] [name] returns today for urethr due to some possible bloody discharge. Patient continues to get bloody urethral discharge after he uses the vacuum erection device.   Past Medical History Problems  1. History of  Arthritis V13.4 2. History of  Bladder Cancer V10.51 3. History of  Diabetes Mellitus 250.00 4. History of  Functional Murmur 785.2 5. History of  Glaucoma 365.9 6. History of  Heartburn 787.1 7. History of  Hypercholesterolemia 272.0 8. History of  Hypertension 401.9  Surgical History Problems  1. History of  Bladder Surgery  Current Meds 1. Acetaminophen 325 MG Oral Tablet; Therapy: (Recorded:28Apr2008) to 2. Aspirin 325 MG Oral Tablet; Therapy: (Recorded:28Apr2008) to 3. Ketotifen Fumarate 0.025 % Ophthalmic Solution; Therapy: (Recorded:02Sep2014) to 4. Lantus SOLN; Therapy: (Recorded:15Jul2014) to 5. Losartan Potassium 50 MG Oral Tablet; Therapy: (Recorded:15Jul2014) to 6. MetFORMIN HCl 1000 MG Oral Tablet; Therapy: (Recorded:28Apr2008) to 7. Milk of Magnesia CONC; Therapy: (Recorded:28Apr2008) to 8. NovoLOG SOLN; Therapy: (Recorded:15Jul2014) to 9. Omeprazole 20 MG Oral Capsule Delayed Release; TAKE 2 CAPSULE Other before one meal a  day; Therapy: (Recorded:28Apr2008) to 10. Polyvinyl Alcohol 1.4 % Ophthalmic Solution; Therapy: (Recorded:02Sep2014) to 11. Promethazine-Codeine 6.25-10 MG/5ML Oral Syrup; Therapy: (Recorded:02Sep2014) to 12. Ranitidine HCl 150 MG Oral Tablet; Therapy:  (Recorded:26Jun2012) to 13. Simethicone CHEW; Therapy: (Recorded:28Apr2008) to 14. Simvastatin 40 MG Oral Tablet; TAKE 1/2 TABLET DAILY; Therapy: (Recorded:28Apr2008) to 15. Tetrahydrozoline HCl 0.05 % Ophthalmic Solution; Therapy: (Recorded:15Jul2014) to  Allergies Medication  1. No Known Drug Allergies  Family History Problems  1. Maternal history of  Diabetes Mellitus V18.0 2. Paternal history of  Family Health Status Number Of Children 2 daughters 3. Maternal history of  Hypertension 4. Paternal history of  Hypertension  Social History Problems  1. Alcohol Use 1 per day 2. Marital History - Single 3. Retired From Work 4. History of  Tobacco Use V15.82 Quit 30 years ago; smoked for 20 years Denied  5. History of  Caffeine Use  Review of Systems Constitutional, cardiovascular, pulmonary and neurological system(s) were reviewed and pertinent findings if present are noted.    Vitals Vital Signs [Data Includes: Last 1 Day]  02Sep2014 03:17PM  Blood Pressure: 167 / 90 Temperature: 98.3 F Heart Rate: 58  Physical Exam Constitutional: Well nourished and well developed . No acute distress.  Pulmonary: No respiratory distress and normal respiratory rhythm and effort.  Cardiovascular: Heart rate and rhythm are normal . No peripheral edema.  Abdomen: The abdomen is soft and nontender. No masses are palpated. No CVA tenderness. No hernias are palpable. No hepatosplenomegaly noted.  Neuro/Psych:. Mood and affect are appropriate.    Results/Data Urine [Data Includes: Last 1 Day]   02Sep2014  COLOR YELLOW   APPEARANCE CLEAR   SPECIFIC GRAVITY 1.015   pH 7.0   GLUCOSE NEG mg/dL  BILIRUBIN NEG   KETONE TRACE mg/dL  BLOOD MOD   PROTEIN 161 mg/dL  UROBILINOGEN 1 mg/dL  NITRITE NEG   LEUKOCYTE ESTERASE NEG   SQUAMOUS EPITHELIAL/HPF FEW   WBC 7-10 WBC/hpf  RBC 3-6 RBC/hpf  BACTERIA NONE SEEN   CRYSTALS NONE SEEN   CASTS NONE SEEN    Procedure  Procedure: Cystoscopy    Informed Consent: Risks, benefits, and potential adverse events were discussed and informed consent was obtained from the patient.  Prep: The patient was prepped with betadine.  Procedure Note:  Urethral meatus:. No abnormalities.  Anterior urethra:. A papillary tumor was identified.  There is a papillary tumor in the proximal urethra.  Bladder: The patient tolerated the procedure well.  Complications: None.    Assessment Assessed  1. 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 UA With REFLEX  Done: 02Sep2014 03:00PM Urethral Neoplasm Location (189.3)  2. Follow-up Schedule Surgery Office  Follow-up  Done: 02Sep2014  Discussion/Summary    I discussed the endoscopic findings with the patient and recommended a formal cystoscopy and urethral biopsy in the operating room with fulguration. We discussed the nature risks benefits and alternatives to this procedure and he elects to proceed.     Signatures Electronically signed by : Jerilee Field, M.D.; Jan 15 2013  4:13PM

## 2013-01-22 ENCOUNTER — Encounter (HOSPITAL_BASED_OUTPATIENT_CLINIC_OR_DEPARTMENT_OTHER): Payer: Self-pay | Admitting: *Deleted

## 2013-01-22 ENCOUNTER — Ambulatory Visit (HOSPITAL_BASED_OUTPATIENT_CLINIC_OR_DEPARTMENT_OTHER)
Admission: RE | Admit: 2013-01-22 | Discharge: 2013-01-22 | Disposition: A | Discharge status: Home / Self Care | Payer: Medicare Other | Source: Ambulatory Visit | Attending: Urology | Admitting: Urology

## 2013-01-22 ENCOUNTER — Ambulatory Visit (HOSPITAL_BASED_OUTPATIENT_CLINIC_OR_DEPARTMENT_OTHER): Payer: Medicare Other | Admitting: Anesthesiology

## 2013-01-22 ENCOUNTER — Encounter (HOSPITAL_BASED_OUTPATIENT_CLINIC_OR_DEPARTMENT_OTHER)
Admission: RE | Disposition: A | Discharge status: Home / Self Care | Payer: Self-pay | Source: Ambulatory Visit | Attending: Urology

## 2013-01-22 ENCOUNTER — Encounter (HOSPITAL_BASED_OUTPATIENT_CLINIC_OR_DEPARTMENT_OTHER): Payer: Self-pay | Admitting: Anesthesiology

## 2013-01-22 DIAGNOSIS — K219 Gastro-esophageal reflux disease without esophagitis: Secondary | ICD-10-CM | POA: Diagnosis not present

## 2013-01-22 DIAGNOSIS — Z794 Long term (current) use of insulin: Secondary | ICD-10-CM | POA: Insufficient documentation

## 2013-01-22 DIAGNOSIS — Z7982 Long term (current) use of aspirin: Secondary | ICD-10-CM | POA: Diagnosis not present

## 2013-01-22 DIAGNOSIS — E119 Type 2 diabetes mellitus without complications: Secondary | ICD-10-CM | POA: Insufficient documentation

## 2013-01-22 DIAGNOSIS — C679 Malignant neoplasm of bladder, unspecified: Secondary | ICD-10-CM | POA: Diagnosis not present

## 2013-01-22 DIAGNOSIS — Z8551 Personal history of malignant neoplasm of bladder: Secondary | ICD-10-CM | POA: Diagnosis not present

## 2013-01-22 DIAGNOSIS — E7800 Pure hypercholesterolemia, unspecified: Secondary | ICD-10-CM | POA: Insufficient documentation

## 2013-01-22 DIAGNOSIS — D419 Neoplasm of uncertain behavior of unspecified urinary organ: Secondary | ICD-10-CM | POA: Diagnosis not present

## 2013-01-22 DIAGNOSIS — I10 Essential (primary) hypertension: Secondary | ICD-10-CM | POA: Insufficient documentation

## 2013-01-22 DIAGNOSIS — C680 Malignant neoplasm of urethra: Secondary | ICD-10-CM | POA: Diagnosis not present

## 2013-01-22 DIAGNOSIS — C689 Malignant neoplasm of urinary organ, unspecified: Secondary | ICD-10-CM | POA: Diagnosis not present

## 2013-01-22 HISTORY — DX: Personal history of malignant neoplasm of bladder: Z85.51

## 2013-01-22 HISTORY — DX: Polyosteoarthritis, unspecified: M15.9

## 2013-01-22 HISTORY — DX: Dry eye syndrome of bilateral lacrimal glands: H04.123

## 2013-01-22 HISTORY — DX: Type 2 diabetes mellitus without complications: E11.9

## 2013-01-22 HISTORY — DX: Anxiety disorder, unspecified: F41.9

## 2013-01-22 HISTORY — DX: Essential (primary) hypertension: I10

## 2013-01-22 HISTORY — DX: Gastro-esophageal reflux disease without esophagitis: K21.9

## 2013-01-22 HISTORY — PX: CYSTOSCOPY WITH BIOPSY: SHX5122

## 2013-01-22 HISTORY — DX: Ileostomy status: Z93.2

## 2013-01-22 HISTORY — DX: Benign neoplasm of left adrenal gland: D35.02

## 2013-01-22 LAB — POCT I-STAT, CHEM 8
Chloride: 107 mEq/L (ref 96–112)
Glucose, Bld: 140 mg/dL — ABNORMAL HIGH (ref 70–99)
HCT: 36 % — ABNORMAL LOW (ref 39.0–52.0)
Potassium: 4 mEq/L (ref 3.5–5.1)
Sodium: 142 mEq/L (ref 135–145)

## 2013-01-22 LAB — GLUCOSE, CAPILLARY: Glucose-Capillary: 122 mg/dL — ABNORMAL HIGH (ref 70–99)

## 2013-01-22 SURGERY — CYSTOSCOPY, WITH BIOPSY
Anesthesia: General | Site: Urethra | Wound class: Clean Contaminated

## 2013-01-22 MED ORDER — STERILE WATER FOR IRRIGATION IR SOLN
Status: DC | PRN
  Administered 2013-01-22: 1

## 2013-01-22 MED ORDER — FENTANYL CITRATE 0.05 MG/ML IJ SOLN
INTRAMUSCULAR | Status: DC | PRN
  Administered 2013-01-22 (×3): 50 ug via INTRAVENOUS

## 2013-01-22 MED ORDER — STERILE WATER FOR IRRIGATION IR SOLN
Status: DC | PRN
  Administered 2013-01-22: 3000 mL

## 2013-01-22 MED ORDER — LIDOCAINE HCL (CARDIAC) 20 MG/ML IV SOLN
INTRAVENOUS | Status: DC | PRN
  Administered 2013-01-22: 80 mg via INTRAVENOUS

## 2013-01-22 MED ORDER — CEFAZOLIN SODIUM 1-5 GM-% IV SOLN
1.0000 g | INTRAVENOUS | Status: DC
  Filled 2013-01-22: qty 50

## 2013-01-22 MED ORDER — HYDROCODONE-ACETAMINOPHEN 5-325 MG PO TABS: 1.0000 | ORAL_TABLET | Freq: Four times a day (QID) | ORAL | Status: DC | PRN

## 2013-01-22 MED ORDER — LACTATED RINGERS IV SOLN
INTRAVENOUS | Status: DC
  Filled 2013-01-22: qty 1000

## 2013-01-22 MED ORDER — LACTATED RINGERS IV SOLN
INTRAVENOUS | Status: DC
  Administered 2013-01-22: 07:00:00 via INTRAVENOUS
  Filled 2013-01-22: qty 1000

## 2013-01-22 MED ORDER — PROPOFOL 10 MG/ML IV BOLUS
INTRAVENOUS | Status: DC | PRN
  Administered 2013-01-22: 200 mg via INTRAVENOUS

## 2013-01-22 MED ORDER — FENTANYL CITRATE 0.05 MG/ML IJ SOLN
25.0000 ug | INTRAMUSCULAR | Status: DC | PRN
  Filled 2013-01-22: qty 1

## 2013-01-22 MED ORDER — CEFAZOLIN SODIUM-DEXTROSE 2-3 GM-% IV SOLR
2.0000 g | INTRAVENOUS | Status: AC
  Administered 2013-01-22: 2 g via INTRAVENOUS
  Filled 2013-01-22: qty 50

## 2013-01-22 MED ORDER — ONDANSETRON HCL 4 MG/2ML IJ SOLN
INTRAMUSCULAR | Status: DC | PRN
  Administered 2013-01-22: 4 mg via INTRAVENOUS

## 2013-01-22 MED ORDER — ASPIRIN 325 MG PO TABS: 325.0000 mg | ORAL_TABLET | Freq: Every day | ORAL | Status: AC

## 2013-01-22 MED ORDER — LIDOCAINE HCL 2 % EX GEL
CUTANEOUS | Status: DC | PRN
  Administered 2013-01-22: 1 via URETHRAL

## 2013-01-22 MED ORDER — HYDROCODONE-ACETAMINOPHEN 5-325 MG PO TABS
1.0000 | ORAL_TABLET | Freq: Four times a day (QID) | ORAL | Status: DC | PRN
  Administered 2013-01-22: 1 via ORAL
  Filled 2013-01-22: qty 1

## 2013-01-22 SURGICAL SUPPLY — 15 items
BAG DRAIN URO-CYSTO SKYTR STRL (DRAIN) ×2 IMPLANT
CANISTER SUCT LVC 12 LTR MEDI- (MISCELLANEOUS) ×2 IMPLANT
CLOTH BEACON ORANGE TIMEOUT ST (SAFETY) ×2 IMPLANT
DRAPE CAMERA CLOSED 9X96 (DRAPES) ×2 IMPLANT
ELECT REM PT RETURN 9FT ADLT (ELECTROSURGICAL) ×2
ELECTRODE REM PT RTRN 9FT ADLT (ELECTROSURGICAL) ×1 IMPLANT
GLOVE BIO SURGEON STRL SZ7.5 (GLOVE) ×2 IMPLANT
GLOVE BIOGEL M 6.5 STRL (GLOVE) ×2 IMPLANT
GLOVE BIOGEL M STER SZ 6 (GLOVE) ×2 IMPLANT
GOWN STRL NON-REIN LRG LVL3 (GOWN DISPOSABLE) ×2 IMPLANT
GOWN STRL REIN XL XLG (GOWN DISPOSABLE) ×2 IMPLANT
NEEDLE HYPO 22GX1.5 SAFETY (NEEDLE) IMPLANT
NS IRRIG 500ML POUR BTL (IV SOLUTION) ×2 IMPLANT
PACK CYSTOSCOPY (CUSTOM PROCEDURE TRAY) ×2 IMPLANT
WATER STERILE IRR 3000ML UROMA (IV SOLUTION) ×2 IMPLANT

## 2013-01-22 NOTE — Anesthesia Preprocedure Evaluation (Addendum)
Anesthesia Evaluation  Patient identified by MRN, date of birth, ID band Patient awake    Reviewed: Allergy & Precautions, H&P , NPO status , Patient's Chart, lab work & pertinent test results, reviewed documented beta blocker date and time   Airway Mallampati: II TM Distance: >3 FB Neck ROM: full    Dental  (+) Edentulous Upper, Missing and Dental Advisory Given Many missing lower teeth:   Pulmonary neg pulmonary ROS,  breath sounds clear to auscultation  Pulmonary exam normal       Cardiovascular Exercise Tolerance: Good hypertension, Pt. on home beta blockers Rhythm:regular Rate:Normal     Neuro/Psych negative neurological ROS  negative psych ROS   GI/Hepatic negative GI ROS, Neg liver ROS, GERD-  Medicated and Controlled,  Endo/Other  diabetes, Well Controlled, Type 2, Insulin Dependent and Oral Hypoglycemic AgentsMorbid obesity  Renal/GU negative Renal ROS  negative genitourinary   Musculoskeletal   Abdominal (+) + obese,   Peds  Hematology negative hematology ROS (+)   Anesthesia Other Findings   Reproductive/Obstetrics negative OB ROS                          Anesthesia Physical Anesthesia Plan  ASA: III  Anesthesia Plan: General   Post-op Pain Management:    Induction: Intravenous  Airway Management Planned: LMA  Additional Equipment:   Intra-op Plan:   Post-operative Plan:   Informed Consent: I have reviewed the patients History and Physical, chart, labs and discussed the procedure including the risks, benefits and alternatives for the proposed anesthesia with the patient or authorized representative who has indicated his/her understanding and acceptance.   Dental Advisory Given  Plan Discussed with: CRNA and Surgeon  Anesthesia Plan Comments:         Anesthesia Quick Evaluation

## 2013-01-22 NOTE — Interval H&P Note (Signed)
History and Physical Interval Note:  01/22/2013 7:46 AM  [name] [name]  has presented today for surgery, with the diagnosis of Urethral Neoplasm  The various methods of treatment have been discussed with the patient and family. After consideration of risks, benefits and other options for treatment, the patient has consented to  Procedure(s) with comments: CYSTOSCOPY WITH BIOPSY (N/A) - Urethra Biopsy      as a surgical intervention .  The patient's history has been reviewed, patient examined, no change in status, stable for surgery.  I have reviewed the patient's chart and labs.   I discussed with the patient the nature, potential benefits, risks and alternatives to the procedure above, including side effects of the proposed treatment, the likelihood of the patient achieving the goals of the procedure, and any potential problems that might occur during the procedure or recuperation. All questions answered. Patient elects to proceed.    Antony Haste

## 2013-01-22 NOTE — Op Note (Signed)
Preoperative diagnosis: Urethral bleeding, urethral neoplasm Postoperative diagnosis: Same  Procedure: Ureteroscopy with biopsy and fulguration  Surgeon: Mena Goes Anesthesia: Gen.  Findings: In the mid urethra and more specifically the proximal penile urethra there was a papillary tumor which was biopsied. Proximal to this the bulb was clear down to the very proximal pole/membranous urethra were another papillary-appearing tumor was located. The urethra terminated just at or above the penile urethra. Both of these abnormal areas were biopsied and fulgurated.  Description of procedure: After was obtained patient brought to the operating room. After adequate anesthesia he is placed in lithotomy position and the external genitalia were prepped and draped in usual fashion. A cystoscope was passed per urethra.the first tumor at the proximal penile urethra was located and this was biopsied with the cold cup biopsy forceps and sent as penile urethra. The bleeding was light therefore proximal ureteroscopy was performed and in the proximal bulb at the membranous urethra and in association with the termination of the urethra there was another papillary tumor located. This was biopsied and sent as membranous urethra. Then the Bugbee was used to fulgurate all these areas and any abnormal mucosa. There was good hemostasis under low pressure. The scope was removed and the lidocaine injected with a lidojet and a penile clamp placed. The patient was awakened taken to recovery room in stable condition.   Complications: None  Blood loss: Minimal  Drains: None   Specimens:  #1 penile urethra  #2 membranous urethra   Patient disposition: Patient stable to PACU

## 2013-01-22 NOTE — Anesthesia Procedure Notes (Signed)
Procedure Name: LMA Insertion Date/Time: 01/22/2013 7:44 AM Performed by: Maris Berger T Pre-anesthesia Checklist: Patient identified, Emergency Drugs available, Suction available and Patient being monitored Patient Re-evaluated:Patient Re-evaluated prior to inductionOxygen Delivery Method: Circle System Utilized Preoxygenation: Pre-oxygenation with 100% oxygen Intubation Type: IV induction Ventilation: Mask ventilation without difficulty LMA: LMA inserted LMA Size: 5.0 Number of attempts: 1 Airway Equipment and Method: bite block Placement Confirmation: positive ETCO2 Dental Injury: Teeth and Oropharynx as per pre-operative assessment

## 2013-01-22 NOTE — Transfer of Care (Signed)
Immediate Anesthesia Transfer of Care Note  Patient: [name] [name]  Procedure(s) Performed: Procedure(s) with comments: CYSTOSCOPY WITH BIOPSY (N/A) - Urethra Biopsy       Patient Location: PACU  Anesthesia Type:General  Level of Consciousness: sedated and responds to stimulation  Airway & Oxygen Therapy: Patient Spontanous Breathing and Patient connected to nasal cannula oxygen  Post-op Assessment: Report given to PACU RN  Post vital signs: Reviewed and stable  Complications: No apparent anesthesia complications

## 2013-01-22 NOTE — Anesthesia Postprocedure Evaluation (Signed)
  Anesthesia Post-op Note  Patient: [name] [name]  Procedure(s) Performed: Procedure(s) (LRB): CYSTOSCOPY WITH BIOPSY (N/A)  Patient Location: PACU  Anesthesia Type: General  Level of Consciousness: awake and alert   Airway and Oxygen Therapy: Patient Spontanous Breathing  Post-op Pain: mild  Post-op Assessment: Post-op Vital signs reviewed, Patient's Cardiovascular Status Stable, Respiratory Function Stable, Patent Airway and No signs of Nausea or vomiting  Last Vitals:  Filed Vitals:   01/22/13 0915  BP: 182/103  Pulse: 50  Temp:   Resp: 19    Post-op Vital Signs: stable   Complications: No apparent anesthesia complications

## 2013-01-23 ENCOUNTER — Encounter (HOSPITAL_BASED_OUTPATIENT_CLINIC_OR_DEPARTMENT_OTHER): Payer: Self-pay | Admitting: Urology

## 2013-01-24 ENCOUNTER — Telehealth: Payer: Self-pay | Admitting: *Deleted

## 2013-01-24 NOTE — Telephone Encounter (Signed)
1. Sudafed 30 mg tid 2. Samples of nasal steroids -check with me on availability

## 2013-01-24 NOTE — Telephone Encounter (Signed)
Pt called states Mucinex is not effective, requesting Rx for congestion.  Please advise

## 2013-01-25 NOTE — Telephone Encounter (Signed)
Spoke with pt advised of MDs message 

## 2013-01-31 DIAGNOSIS — C680 Malignant neoplasm of urethra: Secondary | ICD-10-CM | POA: Diagnosis not present

## 2013-03-20 DIAGNOSIS — C680 Malignant neoplasm of urethra: Secondary | ICD-10-CM | POA: Diagnosis not present

## 2013-03-22 ENCOUNTER — Other Ambulatory Visit: Payer: Self-pay | Admitting: Urology

## 2013-03-27 ENCOUNTER — Ambulatory Visit (INDEPENDENT_AMBULATORY_CARE_PROVIDER_SITE_OTHER): Payer: Medicare Other | Admitting: *Deleted

## 2013-03-27 ENCOUNTER — Telehealth: Payer: Self-pay | Admitting: *Deleted

## 2013-03-27 DIAGNOSIS — Z79899 Other long term (current) drug therapy: Secondary | ICD-10-CM | POA: Diagnosis not present

## 2013-03-27 NOTE — Telephone Encounter (Signed)
These changes are OK. [facility], should do a follow up cholesterol panel in 4 weeks.

## 2013-03-27 NOTE — Telephone Encounter (Signed)
Pt arrived at the clinic requesting clarification on medication change.  Pt states his MD at [facility] changed his medication and before he started taking medication he wanted to make sure it was ok with Dr Debby Bud.  Pt prescribed Simvastatin 40mg  at bedtime by Dr Debby Bud.  [facility] reduced Simvastatin 40mg  to 20mg  at bedtime, also added Amlodipine 2.5mg daily for blood pressure.  Please advise

## 2013-03-27 NOTE — Progress Notes (Signed)
Pt came into the clinic requesting his medications he picked up from [place] matched his med list at [facility].  Once I spoke with the pt, he had been prescribed additional medications and previously prescribed meds decreased.  Pt did not wasn't to start new regiment until PCP at [facility] ok'd the changes.  I sent MD a message requesting advise.

## 2013-03-28 NOTE — Telephone Encounter (Signed)
Spoke with pt advised of MDs message 

## 2013-04-09 ENCOUNTER — Encounter (HOSPITAL_BASED_OUTPATIENT_CLINIC_OR_DEPARTMENT_OTHER): Payer: Self-pay | Admitting: *Deleted

## 2013-04-09 NOTE — Progress Notes (Signed)
04/09/13 1243  OBSTRUCTIVE SLEEP APNEA  Have you ever been diagnosed with sleep apnea through a sleep study? No  Do you snore loudly (loud enough to be heard through closed doors)?  0  Do you often feel tired, fatigued, or sleepy during the daytime? 0  Has anyone observed you stop breathing during your sleep? 0  Do you have, or are you being treated for high blood pressure? 1  BMI more than 35 kg/m2? 1  Age over 77 years old? 1  Neck circumference greater than 40 cm/18 inches? 0  Gender: 1  Obstructive Sleep Apnea Score 4

## 2013-04-09 NOTE — Progress Notes (Signed)
NPO AFTER MN. ARRIVE AT 0600. NEEDS ISTAT. CURRENT EKG IN EPIC AND CHART. WILL TAKE ATENOLOL, NORVASC, AND COZAAR AM DOS W/ SIPS OF WATER. PT WILL HALF DOSE  LANTUS HS BEFORE DOS.  [name] [phone], WILL BE PICKING UP AND BE CAREGIVER.

## 2013-04-15 NOTE — Anesthesia Preprocedure Evaluation (Addendum)
Anesthesia Evaluation  Patient identified by MRN, date of birth, ID band Patient awake    Reviewed: Allergy & Precautions, H&P , NPO status , Patient's Chart, lab work & pertinent test results, reviewed documented beta blocker date and time   Airway Mallampati: II TM Distance: >3 FB Neck ROM: full    Dental  (+) Edentulous Upper, Missing and Dental Advisory Given Several front lower teeth missing :   Pulmonary neg pulmonary ROS, former smoker,  breath sounds clear to auscultation  Pulmonary exam normal       Cardiovascular Exercise Tolerance: Good hypertension, Pt. on medications and Pt. on home beta blockers Rhythm:regular Rate:Normal     Neuro/Psych negative neurological ROS  negative psych ROS   GI/Hepatic negative GI ROS, Neg liver ROS, GERD-  Medicated and Controlled,  Endo/Other  diabetes, Well Controlled, Type 2, Insulin Dependent  Renal/GU negative Renal ROS  negative genitourinary   Musculoskeletal   Abdominal   Peds  Hematology negative hematology ROS (+)   Anesthesia Other Findings   Reproductive/Obstetrics negative OB ROS                          Anesthesia Physical Anesthesia Plan  ASA: III  Anesthesia Plan: General   Post-op Pain Management:    Induction: Intravenous  Airway Management Planned: LMA  Additional Equipment:   Intra-op Plan:   Post-operative Plan:   Informed Consent: I have reviewed the patients History and Physical, chart, labs and discussed the procedure including the risks, benefits and alternatives for the proposed anesthesia with the patient or authorized representative who has indicated his/her understanding and acceptance.   Dental Advisory Given  Plan Discussed with: CRNA and Surgeon  Anesthesia Plan Comments:         Anesthesia Quick Evaluation

## 2013-04-15 NOTE — H&P (Signed)
History of Present Illness           He is here with a history of a radical cystectomy and ileal loop urinary diversion in 2000 for bladder cancer, adrenal adenomas.   -Jun 2013 normal CMP  --April 2014 CT [facility] of [facility] normal GU tract apart from a 2.4 x 2 cm left adrenal nodule.  -May 2014 CT of the adrenals consistent with adrenal adenoma  Also, no sign of bladder Ca recurrence in pelvis  -Jul 2014 pink - reddish urethral discharge  -Sept 2014 - penile urethral bx = HG urothelial carcinoma (no subepithelial tissue to determine stage) - clinically appeared superficial and all removed. Proximal (bulb/membranous) penile bx negative.   -Oct 2014 - tumor board - rec urethrectomy      Interval Hx  [name] returns today and has had no recurrent blood per meatus. He underwent CT scan of the abdomen and pelvis March 15, 2013 and brought the disc with him. I reviewed all the images. There was no evidence of mass or lymphadenopathy. There was no evidence of neoplasm or disease. The left adrenal lesion was smaller at 21 mm prior 24 mm.    He wants me to take a look today which might help him decide what to do if we continue to see abnormal mucosal changes.        Past Medical History Problems  1. History of Arthritis (V13.4) 2. History of Functional murmur 3. History of diabetes mellitus (V12.29) 4. History of glaucoma (V12.49) 5. History of heartburn (V12.79) 6. History of hypercholesterolemia (V12.29) 7. History of hypertension (V12.59) 8. Personal history of bladder cancer (V10.51)  Surgical History Problems  1. History of Bladder Surgery 2. History of Cystoscopy With Fulguration Minor Lesion (Under 5mm)  Current Meds 1. Acetaminophen 325 MG Oral Tablet;  Therapy: (Recorded:28Apr2008) to Recorded 2. Aspirin 325 MG Oral Tablet;  Therapy: (Recorded:28Apr2008) to Recorded 3. Ketotifen Fumarate 0.025 % Ophthalmic Solution;  Therapy: (Recorded:02Sep2014) to Recorded 4. Lantus SOLN;  Therapy: (Recorded:15Jul2014) to Recorded 5. Losartan Potassium 50 MG Oral Tablet;  Therapy: (Recorded:15Jul2014) to Recorded 6. MetFORMIN HCl - 1000 MG Oral Tablet;  Therapy: (Recorded:28Apr2008) to Recorded 7. Milk of Magnesia CONC;  Therapy: (Recorded:28Apr2008) to Recorded 8. NovoLOG SOLN;  Therapy: (Recorded:15Jul2014) to Recorded 9. Omeprazole 20 MG Oral Capsule Delayed Release; TAKE 2 CAPSULE Other before one  meal a day;  Therapy: (Recorded:28Apr2008) to Recorded 10. Polyvinyl Alcohol 1.4 % Ophthalmic Solution;   Therapy: (Recorded:02Sep2014) to Recorded 11. Promethazine-Codeine 6.25-10 MG/5ML Oral Syrup;   Therapy: (Recorded:02Sep2014) to Recorded 12. Ranitidine HCl - 150 MG Oral Tablet;   Therapy: (Recorded:26Jun2012) to Recorded 13. Simethicone CHEW;   Therapy: (Recorded:28Apr2008) to Recorded 14. Simvastatin 40 MG Oral Tablet; TAKE 1/2 TABLET DAILY;   Therapy: (Recorded:28Apr2008) to Recorded 15. Tetrahydrozoline HCl - 0.05 % Ophthalmic Solution;   Therapy: (Recorded:15Jul2014) to Recorded  Allergies Medication  1. No Known Drug Allergies  Family History Problems  1. Family history of Diabetes Mellitus (V18.0) : Mother 2. Family history of Family Health Status Number Of Children : Father   2 daughters 3. Family history of Hypertension : Mother 4. Family history of Hypertension : Father  Social History Problems  1. Alcohol Use   1 per day 2. Denied: History of Caffeine Use 3. Former smoker (V15.82) 4. Marital History - Single 5. Retired From Work 6. History of Tobacco Use (V15.82)   Quit 30 years ago; smoked for 20 years  Vitals  Vital Signs [Data Includes: Last 1 Day]  Recorded: 05Nov2014 03:00PM  Blood Pressure: 148 / 82 Temperature: 97.8 F Heart Rate: 58  Physical Exam Constitutional: Well nourished and well developed . No acute distress.  Pulmonary: No respiratory distress and normal  respiratory rhythm and effort.  Cardiovascular: Heart rate and rhythm are normal . No peripheral edema.  Neuro/Psych:. Mood and affect are appropriate.    Results/Data Urine [Data Includes: Last 1 Day]   05Nov2014  COLOR YELLOW   APPEARANCE CLOUDY   SPECIFIC GRAVITY 1.025   pH 6.0   GLUCOSE NEG mg/dL  BILIRUBIN NEG   KETONE NEG mg/dL  BLOOD MOD   PROTEIN 30 mg/dL  UROBILINOGEN 0.2 mg/dL  NITRITE NEG   LEUKOCYTE ESTERASE NEG   SQUAMOUS EPITHELIAL/HPF MANY   WBC 7-10 WBC/hpf  RBC 7-10 RBC/hpf  BACTERIA MANY   CRYSTALS NONE SEEN   CASTS NONE SEEN    Procedure  Procedure: Cystoscopy   Indication: History of Urothelial Carcinoma.  Informed Consent: Risks, benefits, and potential adverse events were discussed and informed consent was obtained from the patient.  Prep: The patient was prepped with betadine.  Procedure Note:  Urethral meatus:. No abnormalities.  Anterior urethra:. Was identified. Overall no evidence of urothelial carcinoma. THere was one area of erythema which I would bx if he wants surveillance.  Bladder: The patient tolerated the procedure well.  Complications: None.    Assessment Assessed  1. 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  Tetrahydrozoline HCl - 0.05 % Ophthalmic Solution    Plan Urethral malignant neoplasm  1. Cysto; Status:Hold For - Appointment,Date of  Service; Requested for:05Nov2014;  2. Follow-up Schedule Surgery Office  Follow-up  Status: Hold For - Appointment   Requested for: 05Nov2014  Discussion/Summary I discussed tumor board recommendations with the patient. We discussed the difficulty of staging disease in the urethra but also the propensity of high-grade urothelial carcinoma to recur and progress. We discussed the issue with progression of the cancer could progress from curable to incurable which is the biggest risk with an alternative such as surveillance and repeat biopsies. We discussed the nature risks and benefits alternatives to ureterectomy. We discussed the circumcision incision as well as the perineal incision. We discussed risks such as bleeding and infection and other cardiovascular operative complications among others. We discussed the urethra could be disease free (stage T0) at surgery, but again one advantage of urethrectomy would be prevention of later recurrence and progression. Discussed chemotherapy and radiation which would be difficult to apply the tumors of the urethra that remained a local stage. We discussed urethrectomy can be very disfiguring and also affect his glanular sensation which could leave him without the ability to get an erection or have an orgasm. All questions answered. He wants surveillance as he said he wouldn't want surgery at  this age. Therefore, I did recommend we do some more biopsy of the urethra to ensure no residual disease and attempt again to stage.     cc: PCP -[facility]     Signatures Electronically signed by : Jerilee Field, M.D.; Mar 20 2013  4:17PM EST

## 2013-04-16 ENCOUNTER — Encounter (HOSPITAL_BASED_OUTPATIENT_CLINIC_OR_DEPARTMENT_OTHER)
Admission: RE | Disposition: A | Discharge status: Home / Self Care | Payer: Self-pay | Source: Ambulatory Visit | Attending: Urology

## 2013-04-16 ENCOUNTER — Encounter (HOSPITAL_BASED_OUTPATIENT_CLINIC_OR_DEPARTMENT_OTHER): Payer: Self-pay | Admitting: *Deleted

## 2013-04-16 ENCOUNTER — Ambulatory Visit (HOSPITAL_BASED_OUTPATIENT_CLINIC_OR_DEPARTMENT_OTHER): Payer: Medicare Other | Admitting: Anesthesiology

## 2013-04-16 ENCOUNTER — Encounter (HOSPITAL_BASED_OUTPATIENT_CLINIC_OR_DEPARTMENT_OTHER): Payer: Medicare Other | Admitting: Anesthesiology

## 2013-04-16 ENCOUNTER — Ambulatory Visit (HOSPITAL_BASED_OUTPATIENT_CLINIC_OR_DEPARTMENT_OTHER)
Admission: RE | Admit: 2013-04-16 | Discharge: 2013-04-16 | Disposition: A | Discharge status: Home / Self Care | Payer: Medicare Other | Source: Ambulatory Visit | Attending: Urology | Admitting: Urology

## 2013-04-16 DIAGNOSIS — Z87891 Personal history of nicotine dependence: Secondary | ICD-10-CM | POA: Diagnosis not present

## 2013-04-16 DIAGNOSIS — D3500 Benign neoplasm of unspecified adrenal gland: Secondary | ICD-10-CM | POA: Insufficient documentation

## 2013-04-16 DIAGNOSIS — E119 Type 2 diabetes mellitus without complications: Secondary | ICD-10-CM | POA: Diagnosis not present

## 2013-04-16 DIAGNOSIS — C680 Malignant neoplasm of urethra: Secondary | ICD-10-CM | POA: Diagnosis not present

## 2013-04-16 DIAGNOSIS — D419 Neoplasm of uncertain behavior of unspecified urinary organ: Secondary | ICD-10-CM | POA: Diagnosis not present

## 2013-04-16 DIAGNOSIS — I10 Essential (primary) hypertension: Secondary | ICD-10-CM | POA: Diagnosis not present

## 2013-04-16 DIAGNOSIS — K219 Gastro-esophageal reflux disease without esophagitis: Secondary | ICD-10-CM | POA: Insufficient documentation

## 2013-04-16 HISTORY — PX: CYSTOSCOPY WITH BIOPSY: SHX5122

## 2013-04-16 HISTORY — DX: Malignant neoplasm of urinary organ, unspecified: C68.9

## 2013-04-16 LAB — POCT I-STAT 4, (NA,K, GLUC, HGB,HCT)
Glucose, Bld: 136 mg/dL — ABNORMAL HIGH (ref 70–99)
Hemoglobin: 12.6 g/dL — ABNORMAL LOW (ref 13.0–17.0)

## 2013-04-16 SURGERY — CYSTOSCOPY, WITH BIOPSY
Anesthesia: General | Site: Urethra

## 2013-04-16 MED ORDER — STERILE WATER FOR IRRIGATION IR SOLN
Status: DC | PRN
  Administered 2013-04-16: 3000 mL

## 2013-04-16 MED ORDER — LIDOCAINE HCL (CARDIAC) 20 MG/ML IV SOLN
INTRAVENOUS | Status: DC | PRN
  Administered 2013-04-16: 70 mg via INTRAVENOUS

## 2013-04-16 MED ORDER — PROPOFOL 10 MG/ML IV BOLUS
INTRAVENOUS | Status: DC | PRN
  Administered 2013-04-16: 200 mg via INTRAVENOUS

## 2013-04-16 MED ORDER — LIDOCAINE HCL 2 % EX GEL
CUTANEOUS | Status: DC | PRN
  Administered 2013-04-16: 1 via URETHRAL

## 2013-04-16 MED ORDER — FENTANYL CITRATE 0.05 MG/ML IJ SOLN
INTRAMUSCULAR | Status: AC
  Filled 2013-04-16: qty 2

## 2013-04-16 MED ORDER — FENTANYL CITRATE 0.05 MG/ML IJ SOLN
25.0000 ug | INTRAMUSCULAR | Status: DC | PRN
  Filled 2013-04-16: qty 1

## 2013-04-16 MED ORDER — INSULIN ASPART 100 UNIT/ML ~~LOC~~ SOLN
0.0000 [IU] | SUBCUTANEOUS | Status: DC
  Filled 2013-04-16: qty 0.15

## 2013-04-16 MED ORDER — EPHEDRINE SULFATE 50 MG/ML IJ SOLN
INTRAMUSCULAR | Status: DC | PRN
  Administered 2013-04-16: 10 mg via INTRAVENOUS

## 2013-04-16 MED ORDER — LACTATED RINGERS IV SOLN
INTRAVENOUS | Status: DC
  Administered 2013-04-16: 08:00:00 via INTRAVENOUS
  Filled 2013-04-16: qty 1000

## 2013-04-16 MED ORDER — CEFAZOLIN SODIUM 1-5 GM-% IV SOLN
1.0000 g | INTRAVENOUS | Status: DC
  Administered 2013-04-16: 2 g via INTRAVENOUS
  Filled 2013-04-16: qty 50

## 2013-04-16 MED ORDER — FENTANYL CITRATE 0.05 MG/ML IJ SOLN
INTRAMUSCULAR | Status: DC | PRN
  Administered 2013-04-16 (×2): 25 ug via INTRAVENOUS
  Administered 2013-04-16: 50 ug via INTRAVENOUS

## 2013-04-16 MED ORDER — LACTATED RINGERS IV SOLN
INTRAVENOUS | Status: DC
  Filled 2013-04-16: qty 1000

## 2013-04-16 MED ORDER — ONDANSETRON HCL 4 MG/2ML IJ SOLN
INTRAMUSCULAR | Status: DC | PRN
  Administered 2013-04-16: 4 mg via INTRAVENOUS

## 2013-04-16 MED ORDER — DEXAMETHASONE SODIUM PHOSPHATE 4 MG/ML IJ SOLN
INTRAMUSCULAR | Status: DC | PRN
  Administered 2013-04-16: 5 mg via INTRAVENOUS

## 2013-04-16 MED ORDER — CEFAZOLIN SODIUM-DEXTROSE 2-3 GM-% IV SOLR
2.0000 g | INTRAVENOUS | Status: DC
  Filled 2013-04-16: qty 50

## 2013-04-16 SURGICAL SUPPLY — 16 items
BAG DRAIN URO-CYSTO SKYTR STRL (DRAIN) ×2 IMPLANT
CANISTER SUCT LVC 12 LTR MEDI- (MISCELLANEOUS) ×2 IMPLANT
CLOTH BEACON ORANGE TIMEOUT ST (SAFETY) ×2 IMPLANT
DRAPE CAMERA CLOSED 9X96 (DRAPES) ×2 IMPLANT
ELECT REM PT RETURN 9FT ADLT (ELECTROSURGICAL) ×2
ELECTRODE REM PT RTRN 9FT ADLT (ELECTROSURGICAL) ×1 IMPLANT
GLOVE BIO SURGEON STRL SZ7.5 (GLOVE) ×2 IMPLANT
GLOVE BIOGEL M STER SZ 6 (GLOVE) ×2 IMPLANT
GLOVE BIOGEL PI IND STRL 6.5 (GLOVE) ×2 IMPLANT
GLOVE BIOGEL PI INDICATOR 6.5 (GLOVE) ×2
GOWN STRL REIN XL XLG (GOWN DISPOSABLE) ×2 IMPLANT
GOWN SURGICAL LARGE (GOWNS) ×2 IMPLANT
NEEDLE HYPO 22GX1.5 SAFETY (NEEDLE) IMPLANT
NS IRRIG 500ML POUR BTL (IV SOLUTION) IMPLANT
PACK CYSTOSCOPY (CUSTOM PROCEDURE TRAY) ×2 IMPLANT
WATER STERILE IRR 3000ML UROMA (IV SOLUTION) ×2 IMPLANT

## 2013-04-16 NOTE — Transfer of Care (Signed)
Immediate Anesthesia Transfer of Care Note  Patient: [name] [name]  Procedure(s) Performed: Procedure(s) (LRB): CYSTOSCOPY WITH BIOPSY OF URETHRA/FULGURATION (N/A)  Patient Location: PACU  Anesthesia Type: General  Level of Consciousness: awake, alert  and oriented  Airway & Oxygen Therapy: Patient Spontanous Breathing and Patient connected to face mask oxygen  Post-op Assessment: Report given to PACU RN and Post -op Vital signs reviewed and stable  Post vital signs: Reviewed and stable  Complications: No apparent anesthesia complications

## 2013-04-16 NOTE — Op Note (Signed)
Preoperative diagnosis: urethral carcinoma Postoperative diagnosis: Same  Procedure: Exam under anesthesia, cystoscopy urethral biopsy and fulguration  Surgeon: Mena Goes Anesthesia: Gen.  Findings: On exam under anesthesia the penis was circumcised and had no lesions. The urethra was palpably normal throughout the length of the penile urethra and perineum. On digital rectal exam the prostate was absent and there were no rectal masses or pelvic masses  On cystoscopy there was no sign of urothelial carcinoma or neoplasm. Prior biopsy site was visualized. More proximal urethra had scarred down more no abnormalities.  Description of procedure: After consent was obtained patient brought to the operating room. After adequate anesthesia he is placed in lithotomy position. A timeout was performed to confirm the patient and procedure. An exam under anesthesia was performed. The patient was prepped and draped in the usual sterile fashion. A cystoscope was passed per urethra. Biopsies of the prior biopsy site dorsal and ventral proximal mid and distal were taken a total of 5 covering all these areas. These were sent to pathology. The area was fulgurated. Hemostasis was excellent. The urethra was infiltrated with lidocaine jelly and he was awakened and taken to recovery in stable condition.  Complications: None Blood loss: Minimal Drains: None  Specimens: Urethral biopsies

## 2013-04-16 NOTE — Anesthesia Procedure Notes (Signed)
Procedure Name: LMA Insertion Date/Time: 04/16/2013 8:00 AM Performed by: Norva Pavlov Pre-anesthesia Checklist: Patient identified, Emergency Drugs available, Suction available and Patient being monitored Patient Re-evaluated:Patient Re-evaluated prior to inductionOxygen Delivery Method: Circle System Utilized Preoxygenation: Pre-oxygenation with 100% oxygen Intubation Type: IV induction Ventilation: Mask ventilation without difficulty LMA: LMA inserted LMA Size: 5.0 Number of attempts: 1 Airway Equipment and Method: bite block Placement Confirmation: positive ETCO2 Tube secured with: Tape Dental Injury: Teeth and Oropharynx as per pre-operative assessment

## 2013-04-16 NOTE — Interval H&P Note (Signed)
History and Physical Interval Note:  04/16/2013 7:53 AM  [name] [name]  has presented today for surgery, with the diagnosis of URETERAL CARCINOMA  The various methods of treatment have been discussed with the patient and family. After consideration of risks, benefits and other options for treatment, the patient has consented to  Procedure(s): CYSTOSCOPY WITH BIOPSY OF URETHRA/VULGURATION (N/A) as a surgical intervention .  The patient's history has been reviewed, patient examined, no change in status, stable for surgery.  I have reviewed the patient's chart and labs.  Questions were answered to the patient's satisfaction.  He has been well. No f/c/coughs/congestion/CP/SOB. He said he really wants to avoid urethrectomy.    Antony Haste

## 2013-04-16 NOTE — Anesthesia Postprocedure Evaluation (Signed)
  Anesthesia Post-op Note  Patient: [name] [name]  Procedure(s) Performed: Procedure(s) (LRB): CYSTOSCOPY WITH BIOPSY OF URETHRA/FULGURATION (N/A)  Patient Location: PACU  Anesthesia Type: General  Level of Consciousness: awake and alert   Airway and Oxygen Therapy: Patient Spontanous Breathing  Post-op Pain: mild  Post-op Assessment: Post-op Vital signs reviewed, Patient's Cardiovascular Status Stable, Respiratory Function Stable, Patent Airway and No signs of Nausea or vomiting  Last Vitals:  Filed Vitals:   04/16/13 0915  BP: 165/91  Pulse: 62  Temp:   Resp: 18    Post-op Vital Signs: stable   Complications: No apparent anesthesia complications

## 2013-04-17 ENCOUNTER — Encounter (HOSPITAL_BASED_OUTPATIENT_CLINIC_OR_DEPARTMENT_OTHER): Payer: Self-pay | Admitting: Urology

## 2013-04-22 DIAGNOSIS — C680 Malignant neoplasm of urethra: Secondary | ICD-10-CM | POA: Diagnosis not present

## 2013-04-25 ENCOUNTER — Ambulatory Visit (INDEPENDENT_AMBULATORY_CARE_PROVIDER_SITE_OTHER): Payer: Medicare Other | Admitting: Internal Medicine

## 2013-04-25 ENCOUNTER — Encounter: Payer: Self-pay | Admitting: Internal Medicine

## 2013-04-25 VITALS — BP 146/90 | HR 68 | Temp 97.1°F | Wt 241.4 lb

## 2013-04-25 DIAGNOSIS — E109 Type 1 diabetes mellitus without complications: Secondary | ICD-10-CM | POA: Diagnosis not present

## 2013-04-25 DIAGNOSIS — C679 Malignant neoplasm of bladder, unspecified: Secondary | ICD-10-CM | POA: Diagnosis not present

## 2013-04-25 DIAGNOSIS — E785 Hyperlipidemia, unspecified: Secondary | ICD-10-CM | POA: Diagnosis not present

## 2013-04-25 NOTE — Progress Notes (Signed)
Pre visit review using our clinic review tool, if applicable. No additional management support is needed unless otherwise documented below in the visit note. 

## 2013-04-25 NOTE — Progress Notes (Signed)
Subjective:    Patient ID: [name] [name], male    DOB: 1934/02/06, 79 y.o.   MRN: [id number]  HPI [name] [name] has been evaluated by [facility] for uretheral cancer - he has had two procedures October and follow up cystoscopy Dec 2nd - biopsy did reveal atypia. He has had follow up with [facility] who has suggested definitive surgery - uretherectomy. CT scan from [place] from October did not reveal any abnormality Abd or pelvis. [name] [name] is weighing the options of surgery vs watchful waiting having a preference for the later.   [name] [name] has been feeling dizzy, felt feverish and having earache on the left. He reports intermittent low grade fever for which he has been taking APPA.  [facility] from lopressor to atenolol with continue control of BP. He had his zocor reduced from 40 mg to 20 mg November 14. Due for follow-up lab work.   Past Medical History  Diagnosis Date  . Anxiety   . Hypertension   . GERD (gastroesophageal reflux disease)   . Generalized OA   . Adenoma of left adrenal gland     STABLE PER UROLOGIST NOTE DR ESKRIDGE  . Type 2 diabetes mellitus   . S/P ileostomy     URINARY ILEAL  . Bilateral dry eyes   . History of bladder cancer     S/P RADICAL CYSTECTOMY W/ IDEAL LOOP URINARY DIVERSION  2000  . Urothelial carcinoma     penile urethra   Past Surgical History  Procedure Laterality Date  . Radical cystectomy and ileal loop diversion  2000    BLADDER CANCER  . Cystoscopy with biopsy N/A 01/22/2013    Procedure: CYSTOSCOPY WITH BIOPSY;  Surgeon: Antony Haste, MD;  [facility];  Service: Urology;  Laterality: N/A;  Urethra Biopsy  . Cystoscopy with biopsy N/A 04/16/2013    Procedure: CYSTOSCOPY WITH BIOPSY OF URETHRA/FULGURATION;  Surgeon: Antony Haste, MD;  Location: [facility];  Service: Urology;  Laterality: N/A;   Family History  Problem Relation Age of Onset  . Diabetes Mother      History   Social History  . Marital Status: Widowed    Spouse Name: N/A    Number of Children: 2  . 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 Smoking status: Former Smoker -- 1.50 packs/day for 20 years    Types: Cigarettes    Quit date: 01/19/1976  . Smokeless tobacco: Never Used  . Alcohol Use: Yes     Comment: OCCASIONAL  . Drug Use: No  . Sexual Activity: Not on file   Other Topics Concern  . Not on file   Social History Narrative    He has a grandson.   Lives alone. [place], but doesn't make long trips.    Current Outpatient Prescriptions on File Prior to Visit  Medication Sig Dispense Refill  . acetaminophen (TYLENOL) 325 MG tablet Take 325 mg by mouth as needed.        Marland Kitchen amLODipine (NORVASC) 2.5 MG tablet Take 2.5 mg by mouth every morning.      Marland Kitchen aspirin 325 MG tablet Take 1 tablet (325 mg total) by mouth daily.      Marland Kitchen atenolol (TENORMIN) 25 MG tablet Take 25 mg by mouth every morning.       Marland Kitchen HYDROcodone-acetaminophen (NORCO/VICODIN)  5-325 MG per tablet Take 1 tablet by mouth every 6 (six) hours as needed for pain.  20 tablet  0  . insulin glargine (LANTUS) 100 UNIT/ML injection Inject 45 Units into the skin at bedtime.       . insulin regular (NOVOLIN R) 100 units/mL injection Inject 5 Units into the skin 3 (three) times daily before meals. Sliding scale as needed      . ketotifen (ZADITOR) 0.025 % ophthalmic solution Place 1 drop into both eyes 2 (two) times daily as needed (irritation).      Marland Kitchen losartan (COZAAR) 50 MG tablet Take 50 mg by mouth 2 (two) times daily.        . magnesium hydroxide (MILK OF MAGNESIA) 400 MG/5ML suspension Take by mouth daily as needed.        . metFORMIN (GLUCOPHAGE) 1000 MG tablet Take 1,000 mg by mouth 2 (two) times daily with a meal.        . omeprazole (PRILOSEC) 20 MG capsule Take 20 mg by mouth at bedtime.       . polyvinyl alcohol (LIQUIFILM TEARS) 1.4  % ophthalmic solution Place 1 drop into both eyes 3 (three) times daily.       . promethazine-codeine (PHENERGAN WITH CODEINE) 6.25-10 MG/5ML syrup Take 5 mLs by mouth every 4 (four) hours as needed for cough.  120 mL  1  . ranitidine (ZANTAC) 150 MG capsule Take 150 mg by mouth every evening.       . simethicone (MYLICON) 80 MG chewable tablet Chew 80 mg by mouth as needed.        . simvastatin (ZOCOR) 40 MG tablet Take 20 mg by mouth every evening.       No current facility-administered medications on file prior to visit.      Review of Systems System review is negative for any constitutional, cardiac, pulmonary, GI or neuro symptoms or complaints other than as described in the HPI.     Objective:   Physical Exam Filed Vitals:   04/25/13 1405  BP: 146/90  Pulse: 68  Temp: 97.1 F (36.2 C)   Wt Readings from Last 3 Encounters:  04/25/13 241 lb 6.4 oz (109.498 kg)  04/16/13 249 lb 8 oz (113.172 kg)  04/16/13 249 lb 8 oz (113.172 kg)   Gen'l - obese man in no distress HEENT  - [place]/AT, EACs/TMs normal Cor - RRR Pulm - normal respirations Neuro - A&O x 3, normal gait       Assessment & Plan:

## 2013-04-29 NOTE — Assessment & Plan Note (Signed)
Last A1C October '13 at the [facility] 7.4%  Plan Continue present regimen

## 2013-04-29 NOTE — Assessment & Plan Note (Signed)
Patient with uretheral cancer. He has been offered definitive surgical treatment. As noted, recent CT abd/pelvis at the [place] was negative.  Plan Per the patient but he seems to favor watchful waiting for now.  (greater than 50% of 30 min visit spent on education and counseling)

## 2013-04-29 NOTE — Assessment & Plan Note (Signed)
[facility]: TC 142/HDL 43/LDL 79  Tgy 71. Dose of zocor recently reduced. He is advised to have follow up lab with recommendations to follow.

## 2013-05-30 ENCOUNTER — Telehealth: Payer: Self-pay | Admitting: Internal Medicine

## 2013-05-30 DIAGNOSIS — E785 Hyperlipidemia, unspecified: Secondary | ICD-10-CM

## 2013-05-30 NOTE — Telephone Encounter (Signed)
Spoke with pt advised of MDs message.  Pt states it has been longer than 4 weeks.  He is coming to lab tomorrow a.m. 1.16.15.

## 2013-05-30 NOTE — Telephone Encounter (Signed)
[facility] for lab (order entered) after being on the lower dose of Zocor for at least 4 weeks.

## 2013-05-30 NOTE — Telephone Encounter (Signed)
Pt request lab order for cholesterol check. Pt stated that he went to see the [place] doctor and they add Amlodipine for him to take and also low the dosage of simvastatin to 20 mg. Please advise.

## 2013-05-31 ENCOUNTER — Other Ambulatory Visit (INDEPENDENT_AMBULATORY_CARE_PROVIDER_SITE_OTHER): Payer: Medicare Other

## 2013-05-31 DIAGNOSIS — E785 Hyperlipidemia, unspecified: Secondary | ICD-10-CM | POA: Diagnosis not present

## 2013-05-31 LAB — LIPID PANEL
CHOL/HDL RATIO: 4
CHOLESTEROL: 168 mg/dL (ref 0–200)
HDL: 39.3 mg/dL (ref >39.00)
LDL CALC: 94 mg/dL (ref 0–99)
Triglycerides: 176 mg/dL — ABNORMAL HIGH (ref 0.0–149.0)
VLDL: 35.2 mg/dL (ref 0.0–40.0)

## 2013-06-02 ENCOUNTER — Encounter: Payer: Self-pay | Admitting: Internal Medicine

## 2013-07-22 DIAGNOSIS — C680 Malignant neoplasm of urethra: Secondary | ICD-10-CM | POA: Diagnosis not present

## 2013-09-12 DIAGNOSIS — Z8551 Personal history of malignant neoplasm of bladder: Secondary | ICD-10-CM | POA: Diagnosis not present

## 2013-09-12 DIAGNOSIS — N529 Male erectile dysfunction, unspecified: Secondary | ICD-10-CM | POA: Diagnosis not present

## 2013-12-19 ENCOUNTER — Other Ambulatory Visit: Payer: Self-pay | Admitting: *Deleted

## 2013-12-19 MED ORDER — RANITIDINE HCL 150 MG PO CAPS: 150.0000 mg | ORAL_CAPSULE | Freq: Every evening | ORAL | Status: DC

## 2014-01-01 ENCOUNTER — Ambulatory Visit: Payer: Medicare Other | Admitting: Internal Medicine

## 2014-03-18 ENCOUNTER — Ambulatory Visit: Payer: Medicare Other | Admitting: Internal Medicine

## 2014-04-08 ENCOUNTER — Encounter: Payer: Self-pay | Admitting: Internal Medicine

## 2014-04-08 ENCOUNTER — Ambulatory Visit (INDEPENDENT_AMBULATORY_CARE_PROVIDER_SITE_OTHER): Payer: Medicare Other | Admitting: Internal Medicine

## 2014-04-08 VITALS — BP 130/70 | HR 68 | Temp 98.2°F | Resp 18 | Ht 71.0 in | Wt 252.0 lb

## 2014-04-08 DIAGNOSIS — IMO0002 Reserved for concepts with insufficient information to code with codable children: Secondary | ICD-10-CM

## 2014-04-08 DIAGNOSIS — M179 Osteoarthritis of knee, unspecified: Secondary | ICD-10-CM

## 2014-04-08 DIAGNOSIS — E10319 Type 1 diabetes mellitus with unspecified diabetic retinopathy without macular edema: Secondary | ICD-10-CM | POA: Diagnosis not present

## 2014-04-08 DIAGNOSIS — C679 Malignant neoplasm of bladder, unspecified: Secondary | ICD-10-CM | POA: Diagnosis not present

## 2014-04-08 DIAGNOSIS — M1710 Unilateral primary osteoarthritis, unspecified knee: Secondary | ICD-10-CM

## 2014-04-08 DIAGNOSIS — I10 Essential (primary) hypertension: Secondary | ICD-10-CM | POA: Diagnosis not present

## 2014-04-08 DIAGNOSIS — K219 Gastro-esophageal reflux disease without esophagitis: Secondary | ICD-10-CM

## 2014-04-08 MED ORDER — PROMETHAZINE-CODEINE 6.25-10 MG/5ML PO SYRP: 5.0000 mL | ORAL_SOLUTION | ORAL | Status: DC | PRN

## 2014-04-08 NOTE — Patient Instructions (Signed)
We have given you the cough syrup.  When he had these episodes of dizziness we would like you to check your blood sugar. Sometimes if you have to low blood sugar or too high a blood sugar you can feel dizzy or not quite yourself.  We will see you back next year for a checkup. If you have any problems or questions please feel free to call our office sooner.  Diabetes and Exercise Exercising regularly is important. It is not just about losing weight. It has many health benefits, such as:  Improving your overall fitness, flexibility, and endurance.  Increasing your bone density.  Helping with weight control.  Decreasing your body fat.  Increasing your muscle strength.  Reducing stress and tension.  Improving your overall health. People with diabetes who exercise gain additional benefits because exercise:  Reduces appetite.  Improves the body's use of blood sugar (glucose).  Helps lower or control blood glucose.  Decreases blood pressure.  Helps control blood lipids (such as cholesterol and triglycerides).  Improves the body's use of the hormone insulin by:  Increasing the body's insulin sensitivity.  Reducing the body's insulin needs.  Decreases the risk for heart disease because exercising:  Lowers cholesterol and triglycerides levels.  Increases the levels of good cholesterol (such as high-density lipoproteins [HDL]) in the body.  Lowers blood glucose levels. YOUR ACTIVITY PLAN  Choose an activity that you enjoy and set realistic goals. Your health care provider or diabetes educator can help you make an activity plan that works for you. Exercise regularly as directed by your health care provider. This includes:  Performing resistance training twice a week such as push-ups, sit-ups, lifting weights, or using resistance bands.  Performing 150 minutes of cardio exercises each week such as walking, running, or playing sports.  Staying active and spending no more than  90 minutes at one time being inactive. Even short bursts of exercise are good for you. Three 10-minute sessions spread throughout the day are just as beneficial as a single 30-minute session. Some exercise ideas include:  Taking the dog for a walk.  Taking the stairs instead of the elevator.  Dancing to your favorite song.  Doing an exercise video.  Doing your favorite exercise with a friend. RECOMMENDATIONS FOR EXERCISING WITH TYPE 1 OR TYPE 2 DIABETES   Check your blood glucose before exercising. If blood glucose levels are greater than 240 mg/dL, check for urine ketones. Do not exercise if ketones are present.  Avoid injecting insulin into areas of the body that are going to be exercised. For example, avoid injecting insulin into:  The arms when playing tennis.  The legs when jogging.  Keep a record of:  Food intake before and after you exercise.  Expected peak times of insulin action.  Blood glucose levels before and after you exercise.  The type and amount of exercise you have done.  Review your records with your health care provider. Your health care provider will help you to develop guidelines for adjusting food intake and insulin amounts before and after exercising.  If you take insulin or oral hypoglycemic agents, watch for signs and symptoms of hypoglycemia. They include:  Dizziness.  Shaking.  Sweating.  Chills.  Confusion.  Drink plenty of water while you exercise to prevent dehydration or heat stroke. Body water is lost during exercise and must be replaced.  Talk to your health care provider before starting an exercise program to make sure it is safe for you. Remember, almost  any type of activity is better than none. Document Released: 07/23/2003 Document Revised: 09/16/2013 Document Reviewed: 10/09/2012 [facility] Patient Information 2015 [place], [place]. This information is not intended to replace advice given to you by your health care provider. Make  sure you discuss any questions you have with your health care provider.

## 2014-04-08 NOTE — Progress Notes (Signed)
Pre visit review using our clinic review tool, if applicable. No additional management support is needed unless otherwise documented below in the visit note. 

## 2014-04-09 NOTE — Assessment & Plan Note (Signed)
Unclear if the patient is truly a type I diabetic however I cannot find documentation of diagnosis. Patient states he has been on insulin for a long time although not as long as he has been told he was a diabetic. He does continue to take Lantus 40 units nightly as well as 7 units Novolin with meals. He has lots of sugar and morning sugars are around 100-110. He does have some high sugars in the evening time. He takes his Lantus around 11 PM and frequently his sugars are in the 200s before he takes his Lantus.

## 2014-04-09 NOTE — Assessment & Plan Note (Signed)
Patient will continue on atenolol and losartan daily. Blood pressure at goal.

## 2014-04-09 NOTE — Assessment & Plan Note (Signed)
Feel likely that his obesity it is continuing to his diabetes, has high blood pressure and has hyperlipidemia. Talked with him about increased diet and exercise efforts to help lose some weight.

## 2014-04-09 NOTE — Assessment & Plan Note (Signed)
He takes omeprazole and Zantac. This gives him fairly good results although he occasionally takes milk of magnesia when he gets breakthrough symptoms. We'll have to review records to see if he's ever had EGD. He does not wish to see a stomach doctor at this time.

## 2014-04-09 NOTE — Assessment & Plan Note (Signed)
He states that he does not use his pain medication daily but only when he needs it. He does get his pain medicine medication through the [place].

## 2014-04-09 NOTE — Progress Notes (Signed)
   Subjective:    Patient ID: [name] [name], male    DOB: December 25, 1933, 80 y.o.   MRN: [id number]  HPI The patient is a 80 year old man who comes in today to establish care. He does have past medical history of diabetes, obesity, hypertension, osteoarthritis, history of bladder cancer. He states that on occasion he does feel dizzy throughout the day and this is been going on since he's had his last surgery 1 year ago. He states that none of his medications have changed over the last year. He states that he checks his sugars during these episodes and they are not low, he also denies episodes of high sugars. He denies any syncopal episodes, chest pains, breathing problems. He denies any abdominal pain, diarrhea, constipation.  Review of Systems  Constitutional: Negative for fever, activity change, appetite change, fatigue and unexpected weight change.  HENT: Negative.   Eyes: Negative.   Respiratory: Negative for chest tightness, shortness of breath and wheezing.   Cardiovascular: Negative for chest pain, palpitations and leg swelling.  Gastrointestinal: Negative for abdominal pain, diarrhea, constipation, blood in stool and abdominal distention.  Musculoskeletal: Positive for arthralgias. Negative for myalgias and back pain.  Skin: Negative.   Neurological: Positive for dizziness. Negative for weakness, numbness and headaches.      Objective:   Physical Exam  Constitutional: He is oriented to person, place, and time. He appears well-developed and well-nourished.  HENT:  Head: Normocephalic and atraumatic.  Eyes: EOM are normal.  Neck: Normal range of motion.  Cardiovascular: Normal rate and regular rhythm.   No murmur heard. Pulmonary/Chest: Effort normal and breath sounds normal.  Abdominal: Soft. Bowel sounds are normal. He exhibits no distension. There is no tenderness. There is no rebound.  Obese  Neurological: He is alert and oriented to person, place, and time. Coordination  normal.  Slow with ambulation  Skin: Skin is warm and dry.   Filed Vitals:   04/08/14 1524  BP: 130/70  Pulse: 68  Temp: 98.2 F (36.8 C)  TempSrc: Oral  Resp: 18  Height: 5\' 11"  (1.803 m)  Weight: 252 lb 0.6 oz (114.325 kg)  SpO2: 98%      Assessment & Plan:

## 2014-04-09 NOTE — Assessment & Plan Note (Signed)
Patient is currently cancer free however has had recurrent cancer twice with last surgical debridement being one year ago.

## 2014-04-22 DIAGNOSIS — R109 Unspecified abdominal pain: Secondary | ICD-10-CM | POA: Diagnosis not present

## 2014-04-22 DIAGNOSIS — N390 Urinary tract infection, site not specified: Secondary | ICD-10-CM | POA: Diagnosis not present

## 2014-06-13 ENCOUNTER — Other Ambulatory Visit: Payer: Self-pay | Admitting: Internal Medicine

## 2014-09-06 IMAGING — CR DG CHEST 2V
3 series · 3 of 3 positions shown · non-contrast
Comparison: 03/14/2011

CLINICAL DATA: Shortness of breath

CHEST - 2 VIEW

[w chest lat (1 of 2)]
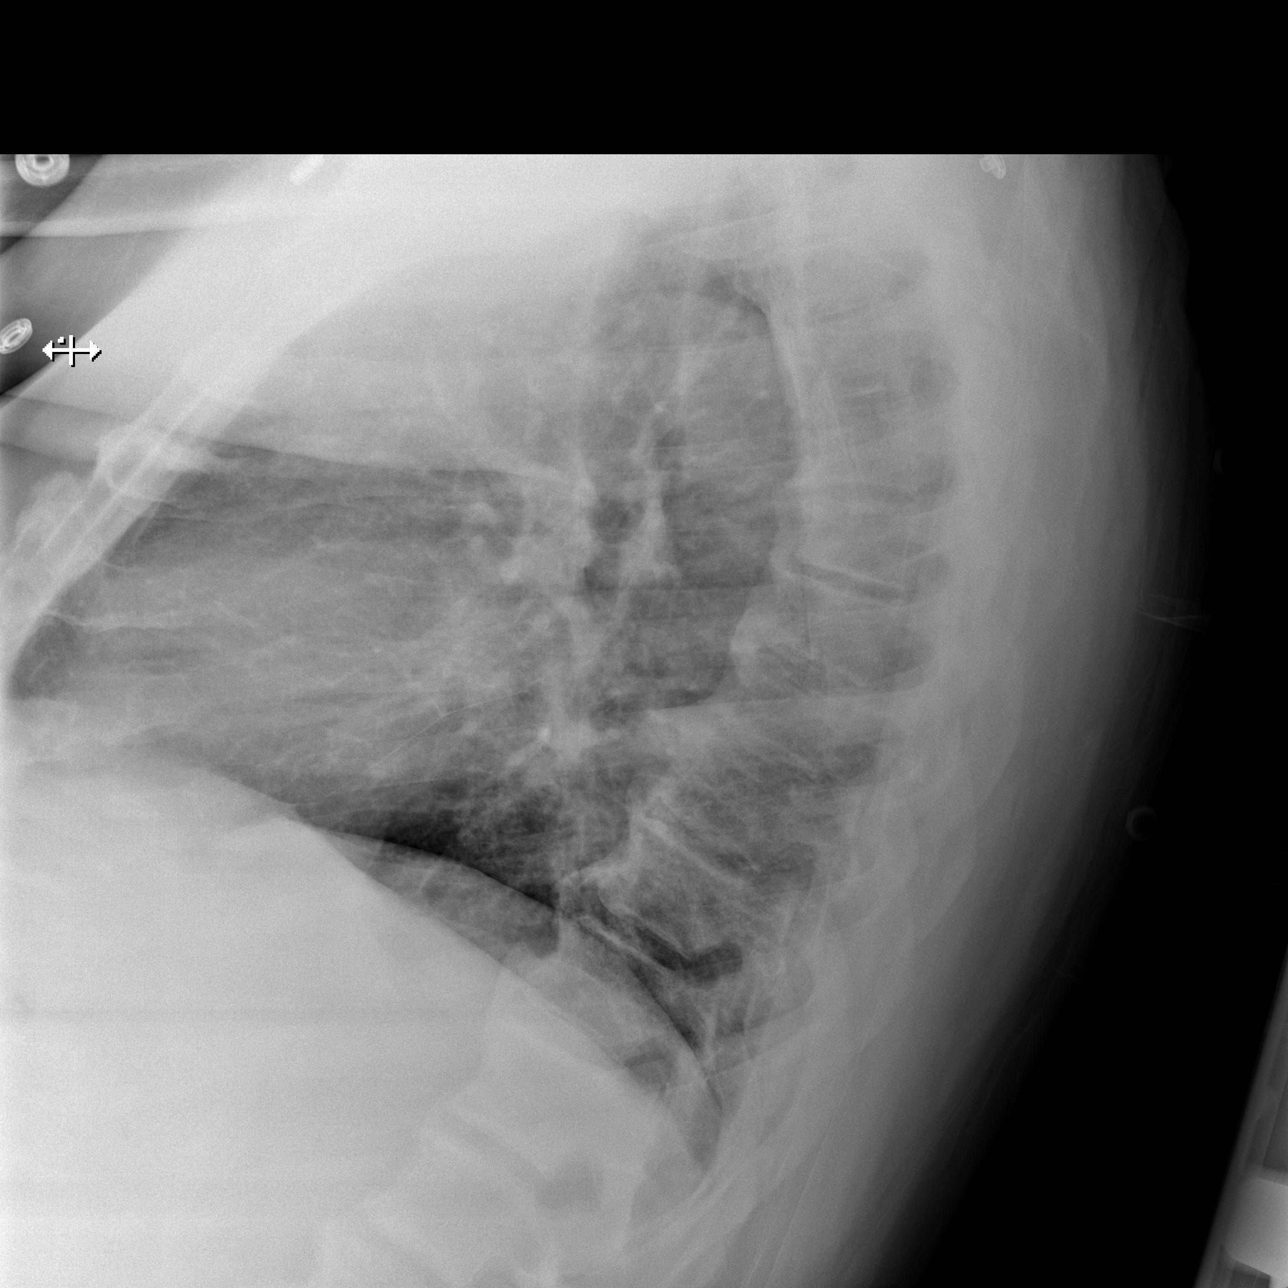

[w chest lat (2 of 2)]
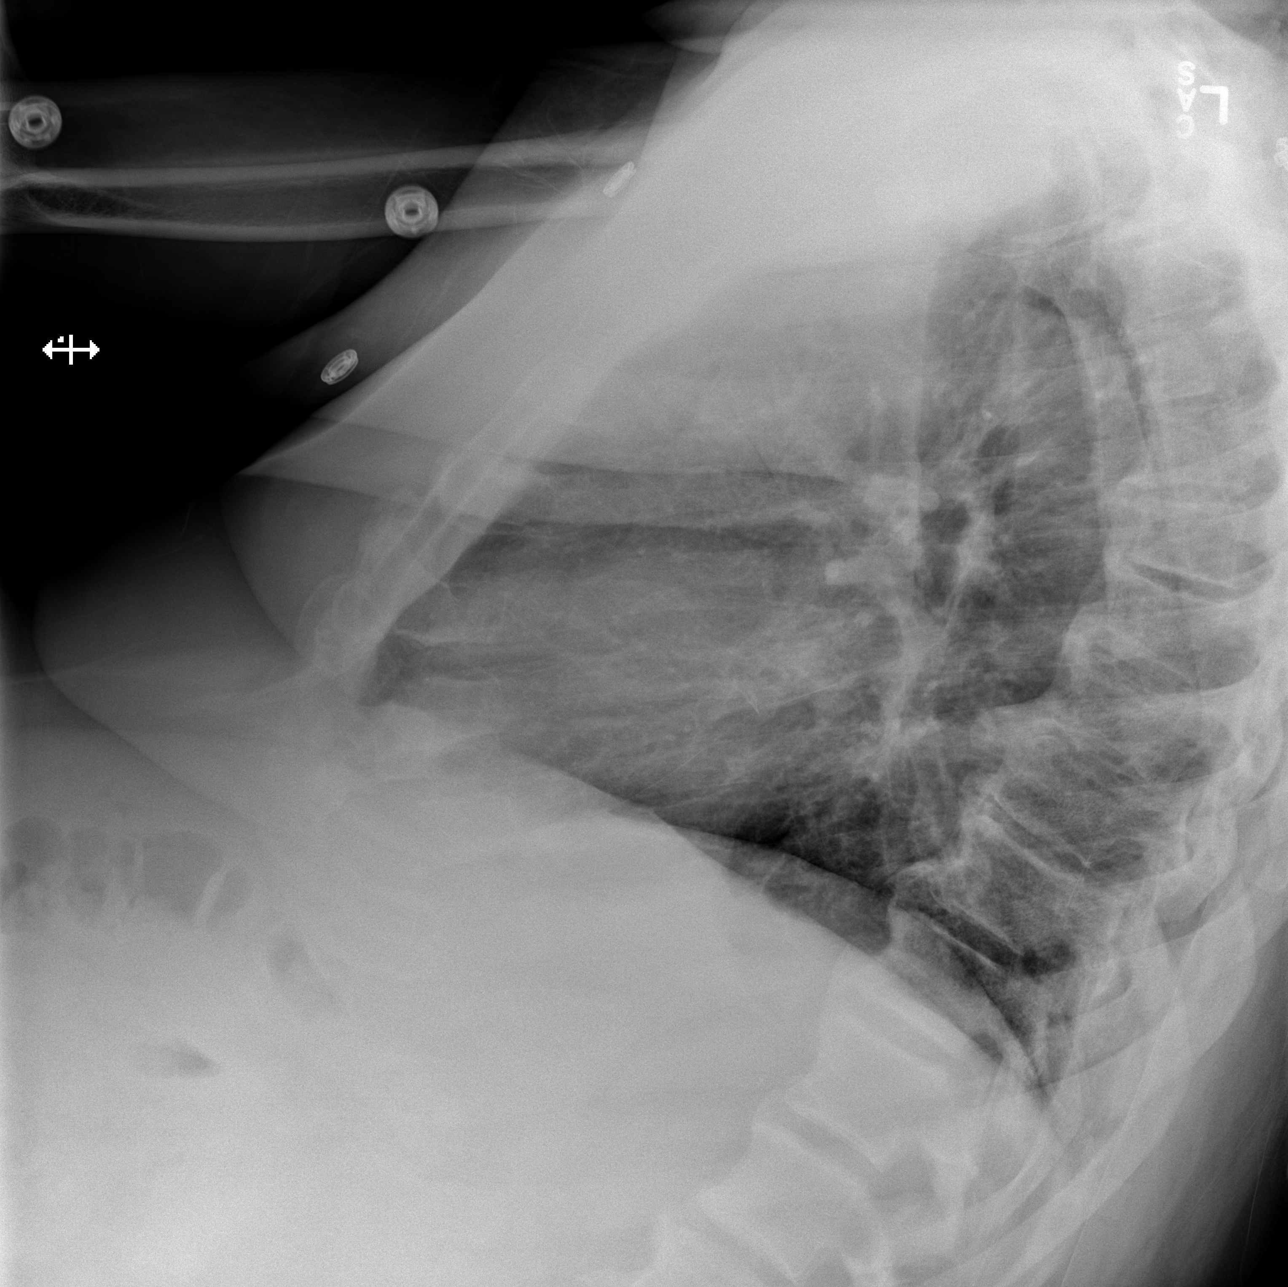

[x chest ap]
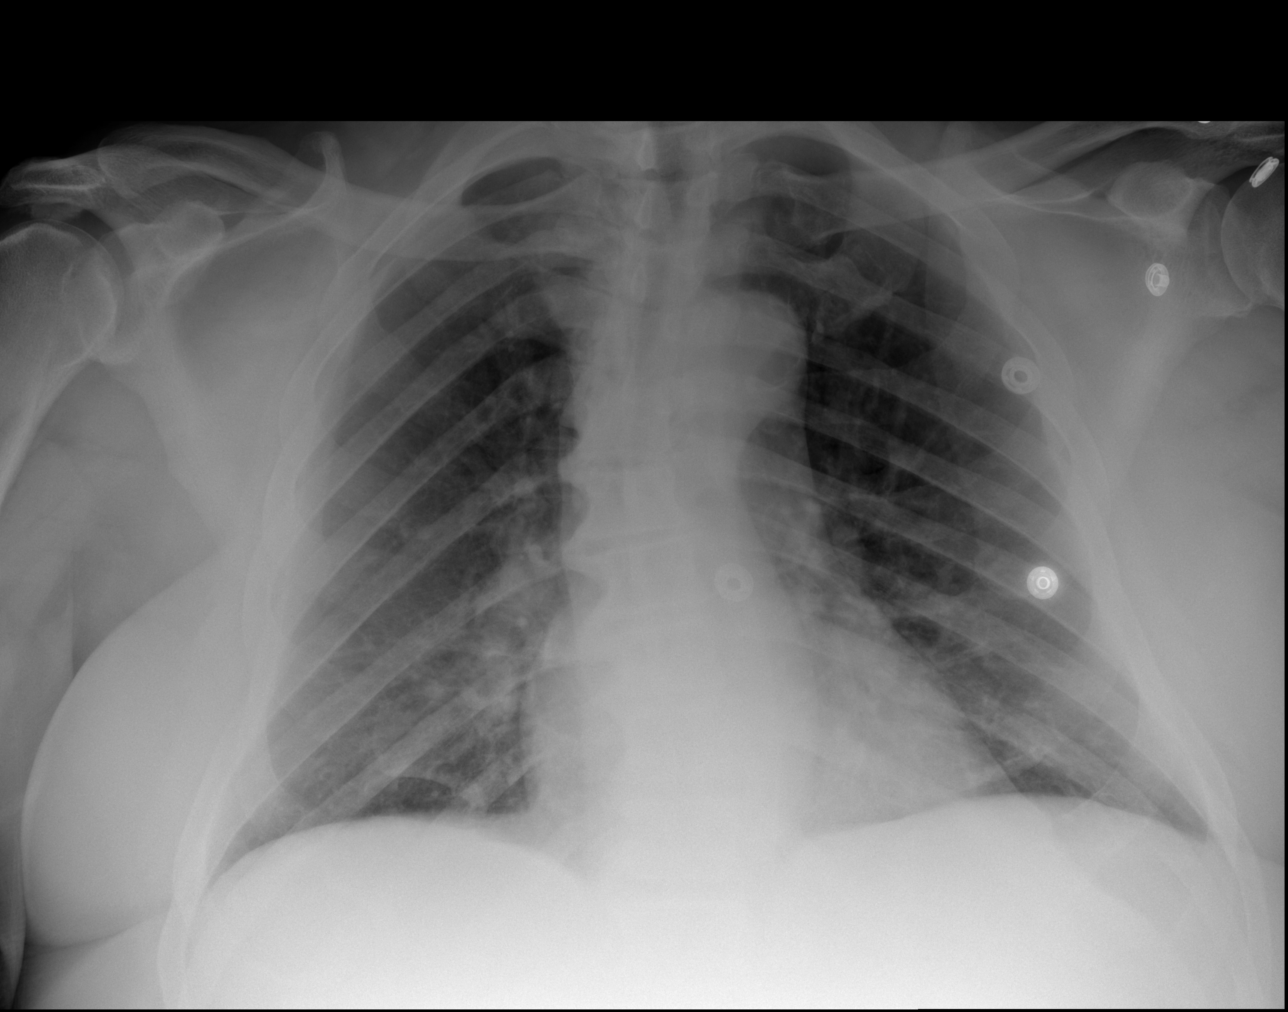

[3 of 3 positions shown; findings below may reference images not displayed]

FINDINGS: The heart size and pulmonary vascularity are normal. The
lungs appear clear and expanded without focal air space disease or
consolidation. No blunting of the costophrenic angles.  No
pneumothorax.  Mediastinal contours appear intact.  There is mild
thoracic scoliosis convex towards the right.  Calcification and
torsion of the aorta.  Degenerative changes in the spine.  No
significant change since previous study.
IMPRESSION: No evidence of active pulmonary disease.  Scoliosis and
degenerative changes in the thoracic spine.

## 2014-09-25 ENCOUNTER — Ambulatory Visit (INDEPENDENT_AMBULATORY_CARE_PROVIDER_SITE_OTHER)
Admission: RE | Admit: 2014-09-25 | Discharge: 2014-09-25 | Disposition: A | Discharge status: Home / Self Care | Payer: Medicare Other | Source: Ambulatory Visit | Attending: Internal Medicine | Admitting: Internal Medicine

## 2014-09-25 ENCOUNTER — Ambulatory Visit (INDEPENDENT_AMBULATORY_CARE_PROVIDER_SITE_OTHER): Payer: Medicare Other | Admitting: Internal Medicine

## 2014-09-25 ENCOUNTER — Encounter: Payer: Self-pay | Admitting: Internal Medicine

## 2014-09-25 VITALS — BP 168/62 | HR 65 | Temp 98.6°F | Resp 18 | Ht 71.0 in | Wt 253.8 lb

## 2014-09-25 DIAGNOSIS — Z0000 Encounter for general adult medical examination without abnormal findings: Secondary | ICD-10-CM

## 2014-09-25 DIAGNOSIS — E109 Type 1 diabetes mellitus without complications: Secondary | ICD-10-CM | POA: Diagnosis not present

## 2014-09-25 DIAGNOSIS — R05 Cough: Secondary | ICD-10-CM

## 2014-09-25 DIAGNOSIS — R059 Cough, unspecified: Secondary | ICD-10-CM

## 2014-09-25 DIAGNOSIS — I10 Essential (primary) hypertension: Secondary | ICD-10-CM | POA: Diagnosis not present

## 2014-09-25 NOTE — Patient Instructions (Signed)
We will check a chest x-ray to make sure we are not missing a pneumonia. I would recommend getting something like claritin or zyrtec to see if that helps with the ear pain and the cough at night time.   We will have you try to get a copy of your labs when you are at the [place] next and drop them off at the office.   Health Maintenance A healthy lifestyle and preventative care can promote health and wellness.  Maintain regular health, dental, and eye exams.  Eat a healthy diet. Foods like vegetables, fruits, whole grains, low-fat dairy products, and lean protein foods contain the nutrients you need and are low in calories. Decrease your intake of foods high in solid fats, added sugars, and salt. Get information about a proper diet from your health care provider, if necessary.  Regular physical exercise is one of the most important things you can do for your health. Most adults should get at least 150 minutes of moderate-intensity exercise (any activity that increases your heart rate and causes you to sweat) each week. In addition, most adults need muscle-strengthening exercises on 2 or more days a week.   Maintain a healthy weight. The body mass index (BMI) is a screening tool to identify possible weight problems. It provides an estimate of body fat based on height and weight. Your health care provider can find your BMI and can help you achieve or maintain a healthy weight. For males 20 years and older:  A BMI below 18.5 is considered underweight.  A BMI of 18.5 to 24.9 is normal.  A BMI of 25 to 29.9 is considered overweight.  A BMI of 30 and above is considered obese.  Maintain normal blood lipids and cholesterol by exercising and minimizing your intake of saturated fat. Eat a balanced diet with plenty of fruits and vegetables. Blood tests for lipids and cholesterol should begin at age 55 and be repeated every 5 years. If your lipid or cholesterol levels are high, you are over age 10, or you are  at high risk for heart disease, you may need your cholesterol levels checked more frequently.Ongoing high lipid and cholesterol levels should be treated with medicines if diet and exercise are not working.  If you smoke, find out from your health care provider how to quit. If you do not use tobacco, do not start.  Lung cancer screening is recommended for adults aged 33-80 years who are at high risk for developing lung cancer because of a history of smoking. A yearly low-dose CT scan of the lungs is recommended for people who have at least a 30-pack-year history of smoking and are current smokers or have quit within the past 15 years. A pack year of smoking is smoking an average of 1 pack of cigarettes a day for 1 year (for example, a 30-pack-year history of smoking could mean smoking 1 pack a day for 30 years or 2 packs a day for 15 years). Yearly screening should continue until the smoker has stopped smoking for at least 15 years. Yearly screening should be stopped for people who develop a health problem that would prevent them from having lung cancer treatment.  If you choose to drink alcohol, do not have more than 2 drinks per day. One drink is considered to be 12 oz (360 mL) of beer, 5 oz (150 mL) of wine, or 1.5 oz (45 mL) of liquor.  Avoid the use of street drugs. Do not share needles with  anyone. Ask for help if you need support or instructions about stopping the use of drugs.  High blood pressure causes heart disease and increases the risk of stroke. Blood pressure should be checked at least every 1-2 years. Ongoing high blood pressure should be treated with medicines if weight loss and exercise are not effective.  If you are 13-30 years old, ask your health care provider if you should take aspirin to prevent heart disease.  Diabetes screening involves taking a blood sample to check your fasting blood sugar level. This should be done once every 3 years after age 89 if you are at a normal  weight and without risk factors for diabetes. Testing should be considered at a younger age or be carried out more frequently if you are overweight and have at least 1 risk factor for diabetes.  Colorectal cancer can be detected and often prevented. Most routine colorectal cancer screening begins at the age of 62 and continues through age 65. However, your health care provider may recommend screening at an earlier age if you have risk factors for colon cancer. On a yearly basis, your health care provider may provide home test kits to check for hidden blood in the stool. A small camera at the end of a tube may be used to directly examine the colon (sigmoidoscopy or colonoscopy) to detect the earliest forms of colorectal cancer. Talk to your health care provider about this at age 17 when routine screening begins. A direct exam of the colon should be repeated every 5-10 years through age 23, unless early forms of precancerous polyps or small growths are found.  People who are at an increased risk for hepatitis B should be screened for this virus. You are considered at high risk for hepatitis B if:  You were born in a country where hepatitis B occurs often. Talk with your health care provider about which countries are considered high risk.  Your parents were born in a high-risk country and you have not received a shot to protect against hepatitis B (hepatitis B vaccine).  You have HIV or AIDS.  You use needles to inject street drugs.  You live with, or have sex with, someone who has hepatitis B.  You are a man who has sex with other men (MSM).  You get hemodialysis treatment.  You take certain medicines for conditions like cancer, organ transplantation, and autoimmune conditions.  Hepatitis C blood testing is recommended for all people born from [birth year] through 1963 and any individual with known risk factors for hepatitis C.  Healthy men should no longer receive prostate-specific antigen (PSA) blood  tests as part of routine cancer screening. Talk to your health care provider about prostate cancer screening.  Testicular cancer screening is not recommended for adolescents or adult males who have no symptoms. Screening includes self-exam, a health care provider exam, and other screening tests. Consult with your health care provider about any symptoms you have or any concerns you have about testicular cancer.  Practice safe sex. Use condoms and avoid high-risk sexual practices to reduce the spread of sexually transmitted infections (STIs).  You should be screened for STIs, including gonorrhea and chlamydia if:  You are sexually active and are younger than 24 years.  You are older than 24 years, and your health care provider tells you that you are at risk for this type of infection.  Your sexual activity has changed since you were last screened, and you are at an increased risk for  chlamydia or gonorrhea. Ask your health care provider if you are at risk.  If you are at risk of being infected with HIV, it is recommended that you take a prescription medicine daily to prevent HIV infection. This is called pre-exposure prophylaxis (PrEP). You are considered at risk if:  You are a man who has sex with other men (MSM).  You are a heterosexual man who is sexually active with multiple partners.  You take drugs by injection.  You are sexually active with a partner who has HIV.  Talk with your health care provider about whether you are at high risk of being infected with HIV. If you choose to begin PrEP, you should first be tested for HIV. You should then be tested every 3 months for as long as you are taking PrEP.  Use sunscreen. Apply sunscreen liberally and repeatedly throughout the day. You should seek shade when your shadow is shorter than you. Protect yourself by wearing long sleeves, pants, a wide-brimmed hat, and sunglasses year round whenever you are outdoors.  Tell your health care  provider of new moles or changes in moles, especially if there is a change in shape or color. Also, tell your health care provider if a mole is larger than the size of a pencil eraser.  A one-time screening for abdominal aortic aneurysm (AAA) and surgical repair of large AAAs by ultrasound is recommended for men aged 50-75 years who are current or former smokers.  Stay current with your vaccines (immunizations). Document Released: 10/29/2007 Document Revised: 05/07/2013 Document Reviewed: 09/27/2010 [facility] Patient Information 2015 [place], [place]. This information is not intended to replace advice given to you by your health care provider. Make sure you discuss any questions you have with your health care provider.

## 2014-09-25 NOTE — Progress Notes (Signed)
   Subjective:    Patient ID: [name] [name], male    DOB: [birth year], 81 y.o.   MRN: [id number]  HPI Here for medicare wellness, no new complaints. Please see A/P for status and treatment of chronic medical problems.   Diet: DM if diabetic Physical activity: sedentary Depression/mood screen: negative Hearing: intact to whispered voice Visual acuity: grossly normal, performs annual eye exam  ADLs: capable Fall risk: none Home safety: good Cognitive evaluation: intact to orientation, naming, recall and repetition EOL planning: adv directives discussed  I have personally reviewed and have noted 1. The patient's medical and social history - reviewed today no changes 2. Their use of alcohol, tobacco or illicit drugs 3. Their current medications and supplements 4. The patient's functional ability including ADL's, fall risks, home safety risks and hearing or visual impairment. 5. Diet and physical activities 6. Evidence for depression or mood disorders 7. Care team reviewed and updated (available in snapshot)  Review of Systems  Constitutional: Negative for fever, activity change, appetite change, fatigue and unexpected weight change.  HENT: Negative.   Eyes: Negative.   Respiratory: Negative for chest tightness, shortness of breath and wheezing.   Cardiovascular: Negative for chest pain, palpitations and leg swelling.  Gastrointestinal: Negative for abdominal pain, diarrhea, constipation, blood in stool and abdominal distention.  Musculoskeletal: Positive for arthralgias. Negative for myalgias and back pain.  Skin: Negative.   Neurological: Positive for dizziness. Negative for weakness, numbness and headaches.       Objective:   Physical Exam  Constitutional: He is oriented to person, place, and time. He appears well-developed and well-nourished.  HENT:  Head: Normocephalic and atraumatic.  Eyes: EOM are normal.  Neck: Normal range of motion.  Cardiovascular: Normal rate  and regular rhythm.   No murmur heard. Pulmonary/Chest: Effort normal and breath sounds normal.  Abdominal: Soft. Bowel sounds are normal. He exhibits no distension. There is no tenderness. There is no rebound.  Obese  Neurological: He is alert and oriented to person, place, and time. Coordination normal.  Slow with ambulation  Skin: Skin is warm and dry.   Filed Vitals:   09/25/14 1515  BP: 168/62  Pulse: 65  Temp: 98.6 F (37 C)  TempSrc: Oral  Resp: 18  Height: 5\' 11"  (1.803 m)  Weight: 253 lb 12.8 oz (115.123 kg)  SpO2: 95%       Assessment & Plan:

## 2014-09-25 NOTE — Progress Notes (Signed)
Pre visit review using our clinic review tool, if applicable. No additional management support is needed unless otherwise documented below in the visit note. 

## 2014-09-26 DIAGNOSIS — Z0000 Encounter for general adult medical examination without abnormal findings: Secondary | ICD-10-CM | POA: Insufficient documentation

## 2014-09-26 NOTE — Assessment & Plan Note (Signed)
Given 10 year screening recommendations and he does get his preventative care at the [place]. Talked with him about his lungs and their health. He needs to start exercising to keep himself healthy and to work on weight loss as his obesity is worsening his blood pressure and his diabetes at this time. Up to date on immunizations (minus shingles). Colonoscopy due next year but unclear if he should still be getting (would age him out of screening at this time).

## 2014-09-26 NOTE — Assessment & Plan Note (Signed)
BP controlled with amlodipine, atenolol, losartan. Labs checked recently at the [place] and he will bring [place] those results.

## 2014-09-26 NOTE — Assessment & Plan Note (Signed)
Will get labs from the [place] but his sugars at home have been very good on the insulins and metformin. Takes lantus and novolin. He is also on ARB.

## 2014-10-09 DIAGNOSIS — R109 Unspecified abdominal pain: Secondary | ICD-10-CM | POA: Diagnosis not present

## 2014-10-09 DIAGNOSIS — Z8551 Personal history of malignant neoplasm of bladder: Secondary | ICD-10-CM | POA: Diagnosis not present

## 2014-12-13 ENCOUNTER — Other Ambulatory Visit: Payer: Self-pay | Admitting: Internal Medicine

## 2015-02-18 ENCOUNTER — Ambulatory Visit (HOSPITAL_COMMUNITY)
Admission: RE | Admit: 2015-02-18 | Discharge: 2015-02-18 | Disposition: A | Discharge status: Home / Self Care | Payer: Medicare Other | Source: Ambulatory Visit | Attending: Internal Medicine | Admitting: Internal Medicine

## 2015-02-18 ENCOUNTER — Ambulatory Visit (INDEPENDENT_AMBULATORY_CARE_PROVIDER_SITE_OTHER): Payer: Medicare Other | Admitting: Internal Medicine

## 2015-02-18 ENCOUNTER — Encounter: Payer: Self-pay | Admitting: Internal Medicine

## 2015-02-18 VITALS — BP 158/94 | HR 64 | Temp 98.3°F | Resp 18 | Ht 71.0 in | Wt 256.0 lb

## 2015-02-18 DIAGNOSIS — M25511 Pain in right shoulder: Secondary | ICD-10-CM

## 2015-02-18 DIAGNOSIS — M542 Cervicalgia: Secondary | ICD-10-CM | POA: Insufficient documentation

## 2015-02-18 DIAGNOSIS — I10 Essential (primary) hypertension: Secondary | ICD-10-CM

## 2015-02-18 MED ORDER — BACLOFEN 10 MG PO TABS: 10.0000 mg | ORAL_TABLET | Freq: Two times a day (BID) | ORAL | Status: DC | PRN

## 2015-02-18 NOTE — Progress Notes (Signed)
Pre visit review using our clinic review tool, if applicable. No additional management support is needed unless otherwise documented below in the visit note. 

## 2015-02-18 NOTE — Assessment & Plan Note (Addendum)
BP likely elevated today due to pain. Will adjust regimen at follow up if still elevated. Not symptomatic. Continue losartan, atenolol, amlodipine.

## 2015-02-18 NOTE — Assessment & Plan Note (Signed)
Due to history of malignancy in the bladder which was recurrent to the urethra will check x-ray. Most likely to be partial tear of rotator cuff versus arthritis. Baclofen for pain as he should not be on NSAIDS and tylenol not effective.

## 2015-02-18 NOTE — Progress Notes (Signed)
   Subjective:    Patient ID: [name] [name], male    DOB: Jul 25, 1933, 81 y.o.   MRN: [id number]  HPI The patient is a 81 YO man coming in for new right shoulder pain. He denies injury to the area. No weight loss or fevers or chills. Has arthritis but is never this bad. Taking his medications he has for pain including tylenol at home without relief. Was nauseous due to the pain yesterday and threw up. No appetite. Worse with pressure such as lying at night time. Worse with activity of any kind. ROM is about the same. He has injured this in the past and it was never treated or evaluated many years ago.   Review of Systems  Constitutional: Negative for fever, activity change, appetite change, fatigue and unexpected weight change.  HENT: Negative.   Eyes: Negative.   Respiratory: Negative for chest tightness, shortness of breath and wheezing.   Cardiovascular: Negative for chest pain, palpitations and leg swelling.  Gastrointestinal: Positive for nausea. Negative for abdominal pain, diarrhea, constipation, blood in stool and abdominal distention.  Musculoskeletal: Positive for myalgias and arthralgias. Negative for back pain.  Skin: Negative.   Neurological: Positive for dizziness. Negative for weakness, numbness and headaches.      Objective:   Physical Exam  Constitutional: He is oriented to person, place, and time. He appears well-developed and well-nourished.  HENT:  Head: Normocephalic and atraumatic.  Eyes: EOM are normal.  Neck: Normal range of motion.  Cardiovascular: Normal rate and regular rhythm.   No murmur heard. Pulmonary/Chest: Effort normal and breath sounds normal.  Abdominal: Soft. Bowel sounds are normal. He exhibits no distension. There is no tenderness. There is no rebound.  Obese  Musculoskeletal: He exhibits tenderness.  ROM limited by pain, most of the tenderness over the [facility] joint.   Neurological: He is alert and oriented to person, place, and time.  Coordination normal.  Slow with ambulation  Skin: Skin is warm and dry.   Filed Vitals:   02/18/15 0934 02/18/15 1010  BP: 170/100 158/94  Pulse: 64   Temp: 98.3 F (36.8 C)   TempSrc: Oral   Resp: 18   Height: 5\' 11"  (1.803 m)   Weight: 256 lb (116.121 kg)   SpO2: 98%       Assessment & Plan:

## 2015-02-18 NOTE — Patient Instructions (Signed)
We will check an x-ray of the right shoulder and call you back about the results.   We have sent in a medicine called baclofen for the pain in the shoulder which you can take 1 pill up to twice a day in between other medicines. Please wait to see how the medicine will affect you before driving or operating machinery once taking the medicine.

## 2015-02-19 ENCOUNTER — Telehealth: Payer: Self-pay | Admitting: Internal Medicine

## 2015-02-19 NOTE — Telephone Encounter (Signed)
Pt called in and would like to know if result of xray is in because he had such a bad night.  He needs to know what to do?  Can you call him back with results

## 2015-02-23 ENCOUNTER — Telehealth: Payer: Self-pay | Admitting: Internal Medicine

## 2015-02-23 NOTE — Telephone Encounter (Signed)
Pt states muscle relaxant that was rx is not helping. Will keep f/u appt...Johny Chess

## 2015-02-23 NOTE — Telephone Encounter (Signed)
I don't think that he needs visit for tomorrow. Rx sent in for muscle relaxer to his pharmacy at visit 1 week ago that he can try and if not effective can come for visit.

## 2015-02-23 NOTE — Telephone Encounter (Signed)
Patient Name: [name] [name]  DOB: 10/07/1933    Initial Comment caller states he has pain in his back and shoulders   Nurse Assessment  Nurse: Raphael Gibney, RN, Vanita Ingles Date/Time Eilene Ghazi Time): 02/23/2015 1:27:58 PM  Confirm and document reason for call. If symptomatic, describe symptoms. ---Caller states he is having pain in his lower back and hip. Having pain in both shoulders. he saw the doctor last Wednesday and was sent xrays which showed arthritis. Pain is not better. Pain level 6-7 and worsens when he lays down. Pain level 9 when he lays down. He had taken Oxycodone from the [place] and he has run out of the hydrocodone. Muscle relaxers are not helping.  Has the patient traveled out of the country within the last 30 days? ---No  Does the patient have any new or worsening symptoms? ---Yes  Will a triage be completed? ---Yes  Related visit to physician within the last 2 weeks? ---Yes  Does the PT have any chronic conditions? (i.e. diabetes, asthma, etc.) ---Yes  List chronic conditions. ---HTN; diabetes; colostomy     Guidelines    Guideline Title Affirmed Question Affirmed Notes  Shoulder Pain [1] Unable to use arm at all AND [2] because of shoulder pain or stiffness   Leg Swelling and Edema [1] MILD swelling of both ankles (i.e., pedal edema) AND [2] new onset or worsening    Final Disposition User   See Physician within 24 Hours [place], RN, [place]    Comments  Pt states he has appt for tomorrow at 1 pm on 02/24/15. Wants to know if something for pain can be called into his pharmacy.   Referrals  REFERRED TO PCP OFFICE   Disagree/Comply: Comply    Disagree/Comply: Comply

## 2015-02-24 ENCOUNTER — Encounter: Payer: Self-pay | Admitting: Internal Medicine

## 2015-02-24 ENCOUNTER — Ambulatory Visit (INDEPENDENT_AMBULATORY_CARE_PROVIDER_SITE_OTHER): Payer: Medicare Other | Admitting: Internal Medicine

## 2015-02-24 VITALS — BP 164/88 | HR 64 | Temp 98.2°F | Resp 18 | Ht 71.0 in | Wt 255.0 lb

## 2015-02-24 DIAGNOSIS — M25511 Pain in right shoulder: Secondary | ICD-10-CM

## 2015-02-24 MED ORDER — METHYLPREDNISOLONE ACETATE 40 MG/ML IJ SUSP
40.0000 mg | Freq: Once | INTRAMUSCULAR | Status: AC
  Administered 2015-02-24: 40 mg via INTRAMUSCULAR

## 2015-02-24 NOTE — Progress Notes (Signed)
Pre visit review using our clinic review tool, if applicable. No additional management support is needed unless otherwise documented below in the visit note. 

## 2015-02-24 NOTE — Patient Instructions (Addendum)
We have given you a steroid shot today to try to help the shoulder. Give it 1-2 days to work and ice the shoulder tonight for 15 minutes every 3 hours.   We cannot write you a prescription for the narcotics as you are getting them from the [place] and this could violate your policies with them.   If the steroid does not help you need to return to the [place] for pain medicine adjustment.

## 2015-02-26 ENCOUNTER — Emergency Department (HOSPITAL_COMMUNITY)
Admission: EM | Admit: 2015-02-26 | Discharge: 2015-02-26 | Disposition: A | Discharge status: Home / Self Care | Payer: Medicare Other | Attending: Emergency Medicine | Admitting: Emergency Medicine

## 2015-02-26 ENCOUNTER — Encounter (HOSPITAL_COMMUNITY): Payer: Self-pay | Admitting: Emergency Medicine

## 2015-02-26 DIAGNOSIS — Z79899 Other long term (current) drug therapy: Secondary | ICD-10-CM | POA: Diagnosis not present

## 2015-02-26 DIAGNOSIS — M25511 Pain in right shoulder: Secondary | ICD-10-CM | POA: Diagnosis not present

## 2015-02-26 DIAGNOSIS — Z85858 Personal history of malignant neoplasm of other endocrine glands: Secondary | ICD-10-CM | POA: Insufficient documentation

## 2015-02-26 DIAGNOSIS — Z8559 Personal history of malignant neoplasm of other urinary tract organ: Secondary | ICD-10-CM | POA: Diagnosis not present

## 2015-02-26 DIAGNOSIS — M25552 Pain in left hip: Secondary | ICD-10-CM | POA: Diagnosis not present

## 2015-02-26 DIAGNOSIS — M25551 Pain in right hip: Secondary | ICD-10-CM | POA: Insufficient documentation

## 2015-02-26 DIAGNOSIS — Z8551 Personal history of malignant neoplasm of bladder: Secondary | ICD-10-CM | POA: Insufficient documentation

## 2015-02-26 DIAGNOSIS — M1990 Unspecified osteoarthritis, unspecified site: Secondary | ICD-10-CM | POA: Diagnosis not present

## 2015-02-26 DIAGNOSIS — M25512 Pain in left shoulder: Secondary | ICD-10-CM | POA: Diagnosis not present

## 2015-02-26 DIAGNOSIS — K219 Gastro-esophageal reflux disease without esophagitis: Secondary | ICD-10-CM | POA: Diagnosis not present

## 2015-02-26 DIAGNOSIS — M2540 Effusion, unspecified joint: Secondary | ICD-10-CM | POA: Insufficient documentation

## 2015-02-26 DIAGNOSIS — I10 Essential (primary) hypertension: Secondary | ICD-10-CM | POA: Insufficient documentation

## 2015-02-26 DIAGNOSIS — E119 Type 2 diabetes mellitus without complications: Secondary | ICD-10-CM | POA: Diagnosis not present

## 2015-02-26 DIAGNOSIS — Z7982 Long term (current) use of aspirin: Secondary | ICD-10-CM | POA: Diagnosis not present

## 2015-02-26 DIAGNOSIS — F419 Anxiety disorder, unspecified: Secondary | ICD-10-CM | POA: Diagnosis not present

## 2015-02-26 DIAGNOSIS — Z794 Long term (current) use of insulin: Secondary | ICD-10-CM | POA: Insufficient documentation

## 2015-02-26 DIAGNOSIS — Z87891 Personal history of nicotine dependence: Secondary | ICD-10-CM | POA: Insufficient documentation

## 2015-02-26 NOTE — ED Notes (Signed)
Pt here over a week ago for R shoulder pain. Had x rays done at that time. Followed up with PCP on Tuesday who prescribed baclofen and gave the pt a steroid shot. Pt also taking home hydrocodone and tylenol for chronic bilateral shoulder pain, back pain and hip pain. Pt came here because shoulder still hurting.

## 2015-02-26 NOTE — ED Provider Notes (Signed)
CSN: [id number]     Arrival date & time 02/26/15  1536 History   First MD Initiated Contact with Patient 02/26/15 1809     Chief Complaint  Patient presents with  . Shoulder Pain     (Consider location/radiation/quality/duration/timing/severity/associated sxs/prior Treatment) HPI [name] [name] is a 81 y.o. male with history of generalized osteoarthritis, urothelial carcinoma, bladder cancer, with ileostomy, diabetes, presents to emergency department complaining of bilateral shoulder pain, bilateral hip pain. Patient states symptoms for several months, states was diagnosed with arthritis. He states the reason he is here is his right shoulder pain is getting worse. He reports pain worsened over the last week. He has been seen by his primary care doctor twice, last 2 days ago. X-ray shows calcific bicipital tendinitis, arthritis patient denies any recent injuries. He denies any fever or chills. He is currently being treated with a IM steroid shot 2 days ago, oxycodone that he takes 5 mg once a day, baclofen, Tylenol extra strength. Patient reports symptoms are not improving. He states he has to sleep on his right shoulder because of her urostomy connects to bed bag, and states he is unable to sleep at night because unable to lay on that arm. He has tried to sleep in a recliner but unable. He denies any other complaints. No numbness or weakness in his hand or arm.   Past Medical History  Diagnosis Date  . Anxiety   . Hypertension   . GERD (gastroesophageal reflux disease)   . Generalized OA   . Adenoma of left adrenal gland     STABLE PER UROLOGIST NOTE DR ESKRIDGE  . Type 2 diabetes mellitus ([place])   . S/P ileostomy ([place])     URINARY ILEAL  . Bilateral dry eyes   . History of bladder cancer     S/P RADICAL CYSTECTOMY W/ IDEAL LOOP URINARY DIVERSION  2000  . Urothelial carcinoma ([place])     penile urethra   Past Surgical History  Procedure Laterality Date  . Radical cystectomy and ileal  loop diversion  2000    BLADDER CANCER  . Cystoscopy with biopsy N/A 01/22/2013    Procedure: CYSTOSCOPY WITH BIOPSY;  Surgeon: Fredricka Bonine, MD;  Location: [facility];  Service: Urology;  Laterality: N/A;  Urethra Biopsy  . Cystoscopy with biopsy N/A 04/16/2013    Procedure: CYSTOSCOPY WITH BIOPSY OF URETHRA/FULGURATION;  Surgeon: Fredricka Bonine, MD;  Location[facility];  Service: Urology;  Laterality: N/A;   Family History  Problem Relation Age of Onset  . Diabetes Mother    Social History  Substance Use Topics  . Smoking status: Former Smoker -- 1.50 packs/day for 20 years    Types: Cigarettes    Quit date: 01/19/1976  . Smokeless tobacco: Never Used  . Alcohol Use: Yes     Comment: OCCASIONAL    Review of Systems  Constitutional: Negative for fever and chills.  Respiratory: Negative for cough, chest tightness and shortness of breath.   Cardiovascular: Negative for chest pain, palpitations and leg swelling.  Musculoskeletal: Positive for joint swelling and arthralgias. Negative for myalgias, neck pain and neck stiffness.  Skin: Negative for rash.  Allergic/Immunologic: Negative for immunocompromised state.  Neurological: Negative for dizziness, weakness, light-headedness, numbness and headaches.  All other systems reviewed and are negative.     Allergies  Lisinopril  Home Medications   Prior to Admission medications   Medication Sig Start Date End Date Taking? Authorizing Provider  acetaminophen (  TYLENOL) 325 MG tablet Take 325-650 mg by mouth as needed for fever or headache.    Yes Historical Provider, MD  amLODipine (NORVASC) 2.5 MG tablet Take 2.5 mg by mouth every morning.   Yes Historical Provider, MD  aspirin 325 MG tablet Take 1 tablet (325 mg total) by mouth daily. 01/24/13  Yes Festus Aloe, MD  atenolol (TENORMIN) 25 MG tablet Take 25 mg by mouth every morning.    Yes Historical Provider, MD  baclofen  (LIORESAL) 10 MG tablet Take 1 tablet (10 mg total) by mouth 2 (two) times daily as needed for muscle spasms. 02/18/15  Yes Hoyt Koch, MD  insulin glargine (LANTUS) 100 UNIT/ML injection Inject 45 Units into the skin at bedtime.    Yes Historical Provider, MD  insulin regular (NOVOLIN R) 100 units/mL injection Inject 5 Units into the skin 3 (three) times daily before meals. Sliding scale as needed   Yes Historical Provider, MD  ketotifen (ZADITOR) 0.025 % ophthalmic solution Place 1 drop into both eyes 2 (two) times daily as needed (irritation).   Yes Historical Provider, MD  losartan (COZAAR) 50 MG tablet Take 50 mg by mouth 2 (two) times daily.     Yes Historical Provider, MD  magnesium hydroxide (MILK OF MAGNESIA) 400 MG/5ML suspension Take 30 mLs by mouth daily as needed for moderate constipation.    Yes Historical Provider, MD  metFORMIN (GLUCOPHAGE) 1000 MG tablet Take 1,000 mg by mouth 2 (two) times daily with a meal.     Yes Historical Provider, MD  omeprazole (PRILOSEC) 20 MG capsule Take 40 mg by mouth at bedtime.    Yes Historical Provider, MD  OxyCODONE HCl, Abuse Deter, 5 MG TABA Take 1 tablet by mouth every 6 (six) hours as needed (pain). Take one tablet by mouth every six hours as needed.   Yes Historical Provider, MD  polyvinyl alcohol (LIQUIFILM TEARS) 1.4 % ophthalmic solution Place 1 drop into both eyes 3 (three) times daily.    Yes Historical Provider, MD  ranitidine (ZANTAC) 150 MG capsule TAKE 1 CAPSULE (150 MG TOTAL) BY MOUTH EVERY EVENING. 12/15/14  Yes Hoyt Koch, MD  simvastatin (ZOCOR) 40 MG tablet Take 20 mg by mouth every evening.   Yes Historical Provider, MD  vitamin B-12 (CYANOCOBALAMIN) 1000 MCG tablet Take 1,000 mcg by mouth daily.   Yes Historical Provider, MD   BP 172/85 mmHg  Pulse 55  Temp(Src) 98.1 F (36.7 C) (Oral)  Resp 20  SpO2 98% Physical Exam  Constitutional: He is oriented to person, place, and time. He appears well-developed and  well-nourished. No distress.  HENT:  Head: Normocephalic and atraumatic.  Eyes: Conjunctivae are normal.  Neck: Neck supple.  Cardiovascular: Normal rate, regular rhythm and normal heart sounds.   Pulmonary/Chest: Effort normal. No respiratory distress. He has no wheezes. He has no rales.  Musculoskeletal: He exhibits no edema.  Normal appearing right shoulder with no swelling, erythema, not warm to the touch. Diffuse tenderness. Limited ROM of the shoulder. Able to flex and abduct actively and passively only to about 90deg. Pain with internal and external rotation of the shoulder. Normal elbow and hand. Distal radial pulses intact. No obvious forearm or hand swelling.   Neurological: He is alert and oriented to person, place, and time.  Skin: Skin is warm and dry.  Nursing note and vitals reviewed.   ED Course  Procedures (including critical care time) Labs Review Labs Reviewed - No data to display  Imaging Review No results found. I have personally reviewed and evaluated these images and lab results as part of my medical decision-making.   EKG Interpretation None      MDM   Final diagnoses:  Right shoulder pain   Patient with right shoulder pain, no acute injuries. Pain is worsening over last week. Patient has already been seen twice by primary care doctor. He received a steroid IM injection, also taking oxycodone which is prescribed to him to take every 6 hours but he is taking 1 tablet a day because he is worried he is going to ran out. He is also taking extra strength Tylenol daily and baclofen. He states that this is helping him. On exam, patient has no signs of joint infection. He does have limited range of motion of the shoulder with pain with movement in all directions. At this point I am concerned that he may be getting a frozen shoulder. I think patient will benefit from physical therapy and orthopedic's evaluation. Patient states he sees specialist through [facility]. I  advised him to start taking his oxycodone every 4-6 hours for severe pain. Also will call the [place] and have something set up for him to see orthopedic specialist. I can give him referral for one in [place]. Provided him with a sling. Also discussed ways of sleeping on the other side.  Filed Vitals:   02/26/15 1559 02/26/15 1842 02/26/15 2002  BP: 163/75 172/85 168/90  Pulse: 65 55 57  Temp: 98.1 F (36.7 C)    TempSrc: Oral Oral   Resp:  20 16  SpO2: 100% 98% 98%     [name] Senior, PA-C 02/27/15 [place], MD 03/01/15 [phone]

## 2015-02-26 NOTE — Discharge Instructions (Signed)
Take your oxycodone every 4-6 hours for severe pain. Continue to take Tylenol and baclofen. Use sling for comfort as needed. Please follow-up with your primary care doctor also with orthopedic specialist for further evaluation and physical therapy. You can do that through [place] or as referred  Shoulder Pain The shoulder is the joint that connects your arms to your body. The bones that form the shoulder joint include the upper arm bone (humerus), the shoulder blade (scapula), and the collarbone (clavicle). The top of the humerus is shaped like a ball and fits into a rather flat socket on the scapula (glenoid cavity). A combination of muscles and strong, fibrous tissues that connect muscles to bones (tendons) support your shoulder joint and hold the ball in the socket. Small, fluid-filled sacs (bursae) are located in different areas of the joint. They act as cushions between the bones and the overlying soft tissues and help reduce friction between the gliding tendons and the bone as you move your arm. Your shoulder joint allows a wide range of motion in your arm. This range of motion allows you to do things like scratch your back or throw a ball. However, this range of motion also makes your shoulder more prone to pain from overuse and injury. Causes of shoulder pain can originate from both injury and overuse and usually can be grouped in the following four categories:  Redness, swelling, and pain (inflammation) of the tendon (tendinitis) or the bursae (bursitis).  Instability, such as a dislocation of the joint.  Inflammation of the joint (arthritis).  Broken bone (fracture). HOME CARE INSTRUCTIONS   Apply ice to the sore area.  Put ice in a plastic bag.  Place a towel between your skin and the bag.  Leave the ice on for 15-20 minutes, 3-4 times per day for the first 2 days, or as directed by your health care provider.  Stop using cold packs if they do not help with the pain.  If you have a  shoulder sling or immobilizer, wear it as long as your caregiver instructs. Only remove it to shower or bathe. Move your arm as little as possible, but keep your hand moving to prevent swelling.  Squeeze a soft ball or foam pad as much as possible to help prevent swelling.  Only take over-the-counter or prescription medicines for pain, discomfort, or fever as directed by your caregiver. SEEK MEDICAL CARE IF:   Your shoulder pain increases, or new pain develops in your arm, hand, or fingers.  Your hand or fingers become cold and numb.  Your pain is not relieved with medicines. SEEK IMMEDIATE MEDICAL CARE IF:   Your arm, hand, or fingers are numb or tingling.  Your arm, hand, or fingers are significantly swollen or turn white or blue. MAKE SURE YOU:   Understand these instructions.  Will watch your condition.  Will get help right away if you are not doing well or get worse.   This information is not intended to replace advice given to you by your health care provider. Make sure you discuss any questions you have with your health care provider.   Document Released: 02/09/2005 Document Revised: 05/23/2014 Document Reviewed: 08/25/2014 Elsevier Interactive Patient Education [employer].

## 2015-02-27 ENCOUNTER — Ambulatory Visit: Payer: Medicare Other

## 2015-02-27 NOTE — Progress Notes (Signed)
   Subjective:    Patient ID: [name] [name], male    DOB: Oct 07, 1933, 81 y.o.   MRN: [id number]  HPI The patient is a 81 YO male coming in for right shoulder pain. He was seen last week for the same concern. He is taking his percocet for the pain but gets it from the [place] and only has enough to take 1 pill daily which is not enough. No new injury to the area. Feels that he is not able to move it as much. No numbness or weakness in that arm. Pain in the shoulder and not really in the neck.   Review of Systems  Constitutional: Negative for fever, activity change, appetite change, fatigue and unexpected weight change.  HENT: Negative.   Eyes: Negative.   Respiratory: Negative for chest tightness, shortness of breath and wheezing.   Cardiovascular: Negative for chest pain, palpitations and leg swelling.  Gastrointestinal: Negative for nausea, abdominal pain, diarrhea, constipation, blood in stool and abdominal distention.  Musculoskeletal: Positive for myalgias and arthralgias. Negative for back pain.  Skin: Negative.   Neurological: Positive for dizziness. Negative for weakness, numbness and headaches.      Objective:   Physical Exam  Constitutional: He is oriented to person, place, and time. He appears well-developed and well-nourished.  HENT:  Head: Normocephalic and atraumatic.  Eyes: EOM are normal.  Neck: Normal range of motion.  Cardiovascular: Normal rate and regular rhythm.   No murmur heard. Pulmonary/Chest: Effort normal and breath sounds normal.  Abdominal: Soft. Bowel sounds are normal. He exhibits no distension. There is no tenderness. There is no rebound.  Obese  Musculoskeletal: He exhibits tenderness.  ROM limited by pain, most of the tenderness over the [facility] joint.   Neurological: He is alert and oriented to person, place, and time. Coordination normal.  Slow with ambulation  Skin: Skin is warm and dry.   Filed Vitals:   02/24/15 1327  BP: 164/88  Pulse: 64    Temp: 98.2 F (36.8 C)  TempSrc: Oral  Resp: 18  Height: 5\' 11"  (1.803 m)  Weight: 255 lb (115.667 kg)  SpO2: 98%      Assessment & Plan:  Depo-medrol 40 mg IM given at visit

## 2015-02-27 NOTE — Assessment & Plan Note (Signed)
Depo-medrol 40 mg IM given at visit to help with any inflammation. Advised him that he needs to return to the [place] for pain medication adjustment as they are prescribing his narcotics and I cannot change them.

## 2015-03-03 DIAGNOSIS — M7531 Calcific tendinitis of right shoulder: Secondary | ICD-10-CM | POA: Diagnosis not present

## 2015-03-04 ENCOUNTER — Telehealth: Payer: Self-pay | Admitting: Internal Medicine

## 2015-03-04 ENCOUNTER — Ambulatory Visit (INDEPENDENT_AMBULATORY_CARE_PROVIDER_SITE_OTHER): Payer: Medicare Other | Admitting: Internal Medicine

## 2015-03-04 ENCOUNTER — Encounter: Payer: Self-pay | Admitting: Internal Medicine

## 2015-03-04 VITALS — BP 138/78 | HR 68 | Temp 98.0°F | Wt 255.0 lb

## 2015-03-04 DIAGNOSIS — R609 Edema, unspecified: Secondary | ICD-10-CM | POA: Diagnosis not present

## 2015-03-04 DIAGNOSIS — J309 Allergic rhinitis, unspecified: Secondary | ICD-10-CM

## 2015-03-04 DIAGNOSIS — R600 Localized edema: Secondary | ICD-10-CM

## 2015-03-04 DIAGNOSIS — I10 Essential (primary) hypertension: Secondary | ICD-10-CM | POA: Diagnosis not present

## 2015-03-04 MED ORDER — TRIAMCINOLONE ACETONIDE 55 MCG/ACT NA AERO: 2.0000 | INHALATION_SPRAY | Freq: Every day | NASAL | Status: DC

## 2015-03-04 NOTE — Assessment & Plan Note (Addendum)
.  stable overall by history and exam, recent data reviewed with pt, and pt to continue medical treatment as before,  to f/u any worsening symptoms or concerns BP Readings from Last 3 Encounters:  03/04/15 138/78  02/26/15 168/90  02/24/15 164/88

## 2015-03-04 NOTE — Patient Instructions (Addendum)
Please decrease your water bottle intake by at least 2 bottles per day  Please work on the swelling with leg elevation, low salt diet, weight loss, and re-start the compression stockings if you are able  Please take all new medication as prescribed - the nasacort for allergies  Please continue all other medications as before, and refills have been done if requested.  Please have the pharmacy call with any other refills you may need.  Please continue your efforts at being more active, low cholesterol diet, and weight control  Please keep your appointments with your specialists as you may have planned  Please keep your follow up appointments with Dr Sharlet [place], as well as the [facility]

## 2015-03-04 NOTE — Assessment & Plan Note (Signed)
Mild to mod, cont zyrtec as per [place], also add nasacort asd,  to f/u any worsening symptoms or concerns

## 2015-03-04 NOTE — Progress Notes (Signed)
Subjective:    Patient ID: [name] [name], male    DOB: 24-Feb-1934, 81 y.o.   MRN: [id number]  HPI Here to f/u after hour as PCP not available;  Did see ortho yesterday with cortisone to right shoulder bursitis, may be starting to help. Still hurts to raise arm and blood sugars have mildly increased, he adjusted insulin to cover this, no recent low sugars and  Pt denies polydipsia, polyuria.  Wt incresaed overall from 241 in 2014. No longer walking as much - Pt continues to have recurring LBP without change in severity, bowel or bladder change, fever, wt loss,  worsening LE pain/numbness/weakness, gait change or falls. Wt Readings from Last 3 Encounters:  03/04/15 255 lb (115.667 kg)  02/24/15 255 lb (115.667 kg)  02/18/15 256 lb (116.121 kg)  Has new LE edema x 4 wks (or at least not noticed before), has been standing more lately, and sleeping sitting up more due to right shoudler pains.  Also new nonprod cough x several wks as well - has allergies and ? Post nasal drip, but states may have some wheezing "in the throat" at night, not the chest.  Has felt dizzy and "feels like the flu" without fever, but has been tylenol for pain, as well as the oxycodone.  Hs hx of bladder ca with urostomy pouch, drains a few times per day.  Does state he "drink quite a bit of water" daily, states 16 oz bottles x 8-10 bottles per day. Last echo about 6 mo ago at [place], ok per pt.  Last cxr here may 2016 - neg for acute.   [facility]  Has compression stockings at home he does not use Past Medical History  Diagnosis Date  . Anxiety   . Hypertension   . GERD (gastroesophageal reflux disease)   . Generalized OA   . Adenoma of left adrenal gland     STABLE PER UROLOGIST NOTE DR ESKRIDGE  . Type 2 diabetes mellitus ([place])   . S/P ileostomy ([place])     URINARY ILEAL  . Bilateral dry eyes   . History of bladder cancer     S/P RADICAL CYSTECTOMY W/ IDEAL LOOP URINARY DIVERSION   2000  . Urothelial carcinoma ([place])     penile urethra   Past Surgical History  Procedure Laterality Date  . Radical cystectomy and ileal loop diversion  2000    BLADDER CANCER  . Cystoscopy with biopsy N/A 01/22/2013    Procedure: CYSTOSCOPY WITH BIOPSY;  Surgeon: Fredricka Bonine, MD;  Location: [facility];  Service: Urology;  Laterality: N/A;  Urethra Biopsy  . Cystoscopy with biopsy N/A 04/16/2013    Procedure: CYSTOSCOPY WITH BIOPSY OF URETHRA/FULGURATION;  Surgeon: Fredricka Bonine, MD;  Location: [facility];  Service: Urology;  Laterality: N/A;    reports that he quit smoking about 39 years ago. His smoking use included Cigarettes. He has a 30 pack-year smoking history. He has never used smokeless tobacco. He reports that he drinks alcohol. He reports that he does not use illicit drugs. family history includes Diabetes in his mother. Allergies  Allergen Reactions  . Lisinopril Swelling    : facial swelling   Current Outpatient Prescriptions on File Prior to Visit  Medication Sig Dispense Refill  . acetaminophen (TYLENOL) 325 MG tablet Take 325-650 mg by mouth as needed for fever or headache.     Marland Kitchen amLODipine (  NORVASC) 2.5 MG tablet Take 2.5 mg by mouth every morning.    Marland Kitchen aspirin 325 MG tablet Take 1 tablet (325 mg total) by mouth daily.    Marland Kitchen atenolol (TENORMIN) 25 MG tablet Take 25 mg by mouth every morning.     . baclofen (LIORESAL) 10 MG tablet Take 1 tablet (10 mg total) by mouth 2 (two) times daily as needed for muscle spasms. 60 each 1  . insulin glargine (LANTUS) 100 UNIT/ML injection Inject 45 Units into the skin at bedtime.     . insulin regular (NOVOLIN R) 100 units/mL injection Inject 5 Units into the skin 3 (three) times daily before meals. Sliding scale as needed    . ketotifen (ZADITOR) 0.025 % ophthalmic solution Place 1 drop into both eyes 2 (two) times daily as needed (irritation).    Marland Kitchen losartan (COZAAR) 50 MG tablet Take  50 mg by mouth 2 (two) times daily.      . magnesium hydroxide (MILK OF MAGNESIA) 400 MG/5ML suspension Take 30 mLs by mouth daily as needed for moderate constipation.     . metFORMIN (GLUCOPHAGE) 1000 MG tablet Take 1,000 mg by mouth 2 (two) times daily with a meal.      . omeprazole (PRILOSEC) 20 MG capsule Take 40 mg by mouth at bedtime.     . OxyCODONE HCl, Abuse Deter, 5 MG TABA Take 1 tablet by mouth every 6 (six) hours as needed (pain). Take one tablet by mouth every six hours as needed.    . polyvinyl alcohol (LIQUIFILM TEARS) 1.4 % ophthalmic solution Place 1 drop into both eyes 3 (three) times daily.     . ranitidine (ZANTAC) 150 MG capsule TAKE 1 CAPSULE (150 MG TOTAL) BY MOUTH EVERY EVENING. 90 capsule 1  . simvastatin (ZOCOR) 40 MG tablet Take 20 mg by mouth every evening.    . vitamin B-12 (CYANOCOBALAMIN) 1000 MCG tablet Take 1,000 mcg by mouth daily.     No current facility-administered medications on file prior to visit.    Review of Systems  Constitutional: Negative for unusual diaphoresis or night sweats HENT: Negative for ringing in ear or discharge Eyes: Negative for double vision or worsening visual disturbance.  Respiratory: Negative for choking and stridor.   Gastrointestinal: Negative for vomiting or other signifcant bowel change Genitourinary: Negative for hematuria or change in urine volume.  Musculoskeletal: Negative for other MSK pain or swelling Skin: Negative for color change and worsening wound.  Neurological: Negative for tremors and numbness other than noted  Psychiatric/Behavioral: Negative for decreased concentration or agitation other than above       Objective:   Physical Exam BP 138/78 mmHg  Pulse 68  Temp(Src) 98 F (36.7 C)  Wt 255 lb (115.667 kg)  SpO2 97% VS noted,  Constitutional: Pt appears in no significant distress HENT: Head: NCAT.  Right Ear: External ear normal.  Left Ear: External ear normal.  Eyes: . Pupils are equal, round,  and reactive to light. Conjunctivae and EOM are normal Bilat tm's with mild erythema.  Max sinus areas non tender.  Pharynx with mild erythema, no exudate Neck: Normal range of motion. Neck supple.  Cardiovascular: Normal rate and regular rhythm.   Pulmonary/Chest: Effort normal and breath sounds without rales or wheezing.  Abd:  Soft, NT, ND, + BS Neurological: Pt is alert. Not confused , motor grossly intact Skin: Skin is warm. No rash, tace bilat mid leg LE edema Psychiatric: Pt behavior is normal. No agitation.  Assessment & Plan:

## 2015-03-04 NOTE — Assessment & Plan Note (Signed)
Suspect some venous insuff, aggrevated by large polydipsia, needs to reduce overall po fluid intake by several bottles per day, re-start compression stockings

## 2015-03-04 NOTE — Progress Notes (Signed)
Pre visit review using our clinic review tool, if applicable. No additional management support is needed unless otherwise documented below in the visit note. 

## 2015-03-04 NOTE — Telephone Encounter (Signed)
[facility] - [facility] Patient Name: [name] [name] DOB: 1933-10-27 Initial Comment Caller states his feet are really swollen and he is a diabetic. Nurse Assessment Nurse: Markus Daft, RN, Sherre Poot Date/Time Eilene Ghazi Time): 03/04/2015 3:19:25 PM Confirm and document reason for call. If symptomatic, describe symptoms. ---Caller states his feet are really swollen - left > right foot. Saw orthopedic doctor and in ER for bursitis in his shoulder 02/18/15. MD wasn't concerned about swelling. He is trying to elevate his feet, but not helping. Worsening swelling in feet in last 2 days. Has the patient traveled out of the country within the last 30 days? ---Not Applicable Does the patient have any new or worsening symptoms? ---Yes Will a triage be completed? ---Yes Related visit to physician within the last 2 weeks? ---Yes Does the PT have any chronic conditions? (i.e. diabetes, asthma, etc.) ---Yes List chronic conditions. ---IDDM, HTN, Guidelines Guideline Title Affirmed Question Affirmed Notes Leg Swelling and Edema [1] Difficulty breathing with exertion (e.g., walking) AND [2] new onset or worsening ---->new shortness of breath with walking only; swelling only in feet/ankles Final Disposition User Go to ED Now (or PCP triage) Markus Daft, RN, Sherre Poot Comments Appt made at 6 pm today with Dr. Cathlean Cower. Pt aware that if he worsens before appt or has difficulty breathing at rest to go on to ER. Caller verb. understanding. Referrals REFERRED TO PCP OFFICE Disagree/Comply: Comply

## 2015-03-05 ENCOUNTER — Ambulatory Visit: Payer: Self-pay | Admitting: Internal Medicine

## 2015-03-19 ENCOUNTER — Ambulatory Visit (INDEPENDENT_AMBULATORY_CARE_PROVIDER_SITE_OTHER): Payer: Medicare Other

## 2015-03-19 DIAGNOSIS — Z23 Encounter for immunization: Secondary | ICD-10-CM | POA: Diagnosis not present

## 2015-03-30 ENCOUNTER — Ambulatory Visit: Payer: Medicare Other | Admitting: Internal Medicine

## 2015-04-14 DIAGNOSIS — M7531 Calcific tendinitis of right shoulder: Secondary | ICD-10-CM | POA: Diagnosis not present

## 2015-04-20 DIAGNOSIS — N5201 Erectile dysfunction due to arterial insufficiency: Secondary | ICD-10-CM | POA: Diagnosis not present

## 2015-04-20 DIAGNOSIS — C680 Malignant neoplasm of urethra: Secondary | ICD-10-CM | POA: Diagnosis not present

## 2015-04-20 DIAGNOSIS — R3121 Asymptomatic microscopic hematuria: Secondary | ICD-10-CM | POA: Diagnosis not present

## 2015-04-20 DIAGNOSIS — Z8551 Personal history of malignant neoplasm of bladder: Secondary | ICD-10-CM | POA: Diagnosis not present

## 2015-04-24 DIAGNOSIS — M25511 Pain in right shoulder: Secondary | ICD-10-CM | POA: Diagnosis not present

## 2015-04-30 DIAGNOSIS — M7530 Calcific tendinitis of unspecified shoulder: Secondary | ICD-10-CM | POA: Diagnosis not present

## 2015-06-16 ENCOUNTER — Other Ambulatory Visit: Payer: Self-pay | Admitting: Internal Medicine

## 2015-11-03 DIAGNOSIS — R8271 Bacteriuria: Secondary | ICD-10-CM | POA: Diagnosis not present

## 2015-11-03 DIAGNOSIS — Z8551 Personal history of malignant neoplasm of bladder: Secondary | ICD-10-CM | POA: Diagnosis not present

## 2015-11-03 DIAGNOSIS — R3121 Asymptomatic microscopic hematuria: Secondary | ICD-10-CM | POA: Diagnosis not present

## 2015-12-08 ENCOUNTER — Other Ambulatory Visit: Payer: Self-pay | Admitting: Internal Medicine

## 2016-03-22 ENCOUNTER — Encounter: Payer: Self-pay | Admitting: Internal Medicine

## 2016-03-22 ENCOUNTER — Ambulatory Visit (INDEPENDENT_AMBULATORY_CARE_PROVIDER_SITE_OTHER): Payer: Medicare Other | Admitting: Internal Medicine

## 2016-03-22 DIAGNOSIS — R05 Cough: Secondary | ICD-10-CM | POA: Diagnosis not present

## 2016-03-22 DIAGNOSIS — R059 Cough, unspecified: Secondary | ICD-10-CM

## 2016-03-22 MED ORDER — PROMETHAZINE-CODEINE 6.25-10 MG/5ML PO SYRP: 5.0000 mL | ORAL_SOLUTION | Freq: Four times a day (QID) | ORAL | 0 refills | Status: DC | PRN

## 2016-03-22 MED ORDER — ALBUTEROL SULFATE HFA 108 (90 BASE) MCG/ACT IN AERS: 2.0000 | INHALATION_SPRAY | Freq: Four times a day (QID) | RESPIRATORY_TRACT | 2 refills | Status: DC | PRN

## 2016-03-22 NOTE — Progress Notes (Signed)
   Subjective:    Patient ID: [name] [name], male    DOB: 11-28-1933, 82 y.o.   MRN: [id number]  HPI The patient is an 82 YO man coming in for flu like symptoms. Going on for about 5 days now. Denies fever or chills. Has had fevers at home and feeling weak and some SOB. Denies nasal congestion or drainage now but he had some at the onset. He started taking nasacort and mucinex and cough syrup over the counter. He is still coughing a lot. No more fevers now. Still some SOB with exertion and lack of energy. No sputum production. Had sore throat for 1 day which is gone now.   Review of Systems  Constitutional: Positive for activity change, appetite change, fatigue and fever. Negative for chills and unexpected weight change.  HENT: Positive for congestion and sore throat. Negative for ear discharge, ear pain, postnasal drip, rhinorrhea, sinus pain, sinus pressure and trouble swallowing.   Eyes: Negative.   Respiratory: Positive for cough and shortness of breath. Negative for chest tightness and wheezing.   Cardiovascular: Negative.   Gastrointestinal: Negative.   Musculoskeletal: Negative.       Objective:   Physical Exam  Constitutional: He is oriented to person, place, and time. He appears well-developed and well-nourished.  HENT:  Head: Normocephalic and atraumatic.  Oropharynx with some redness and scant clear drainage, no nasal crusting or sinus pressure.   Eyes: EOM are normal.  Cardiovascular: Normal rate and regular rhythm.   Pulmonary/Chest: Effort normal and breath sounds normal. No respiratory distress. He has no wheezes. He has no rales.  Some cough provoked by deep breathing.   Abdominal: Soft. He exhibits no distension. There is no tenderness. There is no rebound.  Lymphadenopathy:    He has no cervical adenopathy.  Neurological: He is alert and oriented to person, place, and time.  Skin: Skin is warm and dry.   Vitals:   03/22/16 1609 03/22/16 1634  BP: (!) 152/88 (!)  142/88  Pulse: 67   Resp: 18   Temp: 98.3 F (36.8 C)   TempSrc: Oral   SpO2: 96%   Weight: 252 lb (114.3 kg)   Height: 5\' 11"  (1.803 m)       Assessment & Plan:

## 2016-03-22 NOTE — Progress Notes (Signed)
Pre visit review using our clinic review tool, if applicable. No additional management support is needed unless otherwise documented below in the visit note. 

## 2016-03-22 NOTE — Patient Instructions (Addendum)
We have given you a prescription for the cough syrup which you can use.   We have also given you an albuterol inhaler which you can use for the breathing if you get short of breath. You can do 1-2 puffs on the inhaler when you need it.

## 2016-03-23 DIAGNOSIS — R05 Cough: Secondary | ICD-10-CM | POA: Insufficient documentation

## 2016-03-23 DIAGNOSIS — R059 Cough, unspecified: Secondary | ICD-10-CM | POA: Insufficient documentation

## 2016-03-23 NOTE — Assessment & Plan Note (Signed)
Rx for phenergan/codeine syrup which he does well with in the past. Rx for albuterol inhaler for the SOB. No wheezing on exam. Suspect residual cough from recent viral URI. If continuing needs CXR given hx bladder cancer.

## 2016-04-21 ENCOUNTER — Other Ambulatory Visit: Payer: Self-pay

## 2016-04-26 ENCOUNTER — Encounter: Payer: Self-pay | Admitting: Internal Medicine

## 2016-04-26 ENCOUNTER — Ambulatory Visit (INDEPENDENT_AMBULATORY_CARE_PROVIDER_SITE_OTHER): Payer: Medicare Other | Admitting: Internal Medicine

## 2016-04-26 DIAGNOSIS — J3089 Other allergic rhinitis: Secondary | ICD-10-CM | POA: Diagnosis not present

## 2016-04-26 DIAGNOSIS — R040 Epistaxis: Secondary | ICD-10-CM

## 2016-04-26 MED ORDER — AMOXICILLIN-POT CLAVULANATE 875-125 MG PO TABS
1.0000 | ORAL_TABLET | Freq: Two times a day (BID) | ORAL | 0 refills | Status: DC
Start: 2016-04-26 — End: 2017-02-10

## 2016-04-26 NOTE — Patient Instructions (Signed)
We have sent in the augmentin for the sinuses. Take 1 pill twice a day for 10 days.  Do not pick or blow the nose for the next week or so and do not use q tips on it. Leave the scab there.   You can use vaseline on the rims of the nose to help it heal.   If you get another nosebleed hold pressure to the nose for 5-10 minutes or until it stops.   You can use affrin on the nostril to help it stop.   If you get more nosebleeds call us back we can send you to the ear, nose and throat doctor to fix the nosebleeds.

## 2016-04-26 NOTE — Assessment & Plan Note (Signed)
Likely due to increased blowing from allergies. Advised to use vaseline to help hydrate. Afrin if needed for repeat nosebleed. Okay to stay off aspirin for the next week. Advised not to pick or vigorous blowing in the next 1-2 weeks.

## 2016-04-26 NOTE — Progress Notes (Signed)
Pre visit review using our clinic review tool, if applicable. No additional management support is needed unless otherwise documented below in the visit note. 

## 2016-04-26 NOTE — Assessment & Plan Note (Signed)
With acute flare, going on 1 month. Rx for augmentin.

## 2016-04-26 NOTE — Progress Notes (Signed)
   Subjective:    Patient ID: [name] [name], male    DOB: 1933/10/01, 82 y.o.   MRN: [id number]  HPI The patient is an 82 YO man coming in for nosebleeds and sinus pain and pressure. Still going on from last month and has never gotten better. Lots of nose drainage and blowing his nose more often. He got nosebleed Friday at night and then Saturday and Sunday as well. He held pressure and they stopped in 5-10 minutes. He stopped taking his aspirin on Saturday as he felt this was causing. He was trying to use qtip on the nose to remove the clot on Sunday which caused it to start bleeding again. He then left it alone and it has not bled again since Sunday. No SOB or chest pains or dizziness. No fevers or chills.   Review of Systems  Constitutional: Negative for activity change, appetite change, chills, fatigue, fever and unexpected weight change.  HENT: Positive for congestion, ear pain, nosebleeds, postnasal drip, rhinorrhea and sinus pressure. Negative for ear discharge, sinus pain, sore throat and trouble swallowing.   Eyes: Negative.   Respiratory: Positive for cough. Negative for chest tightness, shortness of breath and wheezing.   Cardiovascular: Negative.   Gastrointestinal: Negative.   Musculoskeletal: Negative.       Objective:   Physical Exam  Constitutional: He is oriented to person, place, and time. He appears well-developed and well-nourished.  HENT:  Head: Normocephalic and atraumatic.  Right Ear: External ear normal.  Left Ear: External ear normal.  Oropharynx with redness and drainage, nose with crusting and left nostril with clot visible, no visible blood vessels.   Cardiovascular: Normal rate and regular rhythm.   Pulmonary/Chest: Effort normal and breath sounds normal.  Abdominal: Soft. He exhibits no distension. There is no tenderness. There is no rebound.  Neurological: He is alert and oriented to person, place, and time.  Skin: Skin is warm and dry.   Vitals:   04/26/16 1518  BP: 118/60  Pulse: 63  Resp: 16  Temp: 98.5 F (36.9 C)  TempSrc: Oral  SpO2: 98%  Weight: 253 lb (114.8 kg)  Height: 5\' 11"  (1.803 m)      Assessment & Plan:

## 2016-05-02 ENCOUNTER — Telehealth: Payer: Self-pay | Admitting: Internal Medicine

## 2016-05-02 NOTE — Telephone Encounter (Signed)
Attempted to call patient to schedule awv. Patient's phone is busy. Will attempt to call patient back at a later time.

## 2016-05-13 ENCOUNTER — Telehealth: Payer: Self-pay | Admitting: Internal Medicine

## 2016-05-13 NOTE — Telephone Encounter (Signed)
He should be done with medication at this time given that it was 10 day supply prescribed on 12/12. If he is almost done he can stop. Recommend probiotic to help with the diarrhea and safe to take imodium.

## 2016-05-13 NOTE — Telephone Encounter (Signed)
Contacted patient and recommended to keep taking medication and told him that upset stomach is a normal side effect.  Recommended for him to not take it with his food to either take it a little before eating or after but he stated that he is not going to take it that he wants something else. Please advise.  Uses [facility] at [place].

## 2016-05-13 NOTE — Telephone Encounter (Signed)
Patient states that Dr. Sharlet Salina prescribed him Augmentin.  Patient states this has caused him to have diarrhea, upset stomach and dark stool.  Patient states he is not taking this medication anymore and would like to have something else sent to his pharmacy in place.  [facility] [address]

## 2016-05-13 NOTE — Telephone Encounter (Signed)
Tried to contact patient and could not leave a voicemail due to voicemail being full.

## 2016-05-20 ENCOUNTER — Telehealth: Payer: Self-pay

## 2016-05-20 NOTE — Telephone Encounter (Signed)
Contacted patient and stated that his medical certification for application and renewal of disability parking place card was ready fo him to pick up, that is is up front when ever he comes to get it, patient stated understanding

## 2016-05-30 ENCOUNTER — Other Ambulatory Visit: Payer: Self-pay | Admitting: Internal Medicine

## 2016-05-31 IMAGING — CR DG CHEST 2V
2 series · 2 of 2 positions shown · non-contrast
Comparison: 01/01/2013

CLINICAL DATA: Cough/wheezing/congestion/tightness x 2 mo. Hx of
HTN, diabetes.

EXAM:
CHEST  2 VIEW

[view not recorded (1 of 2)]
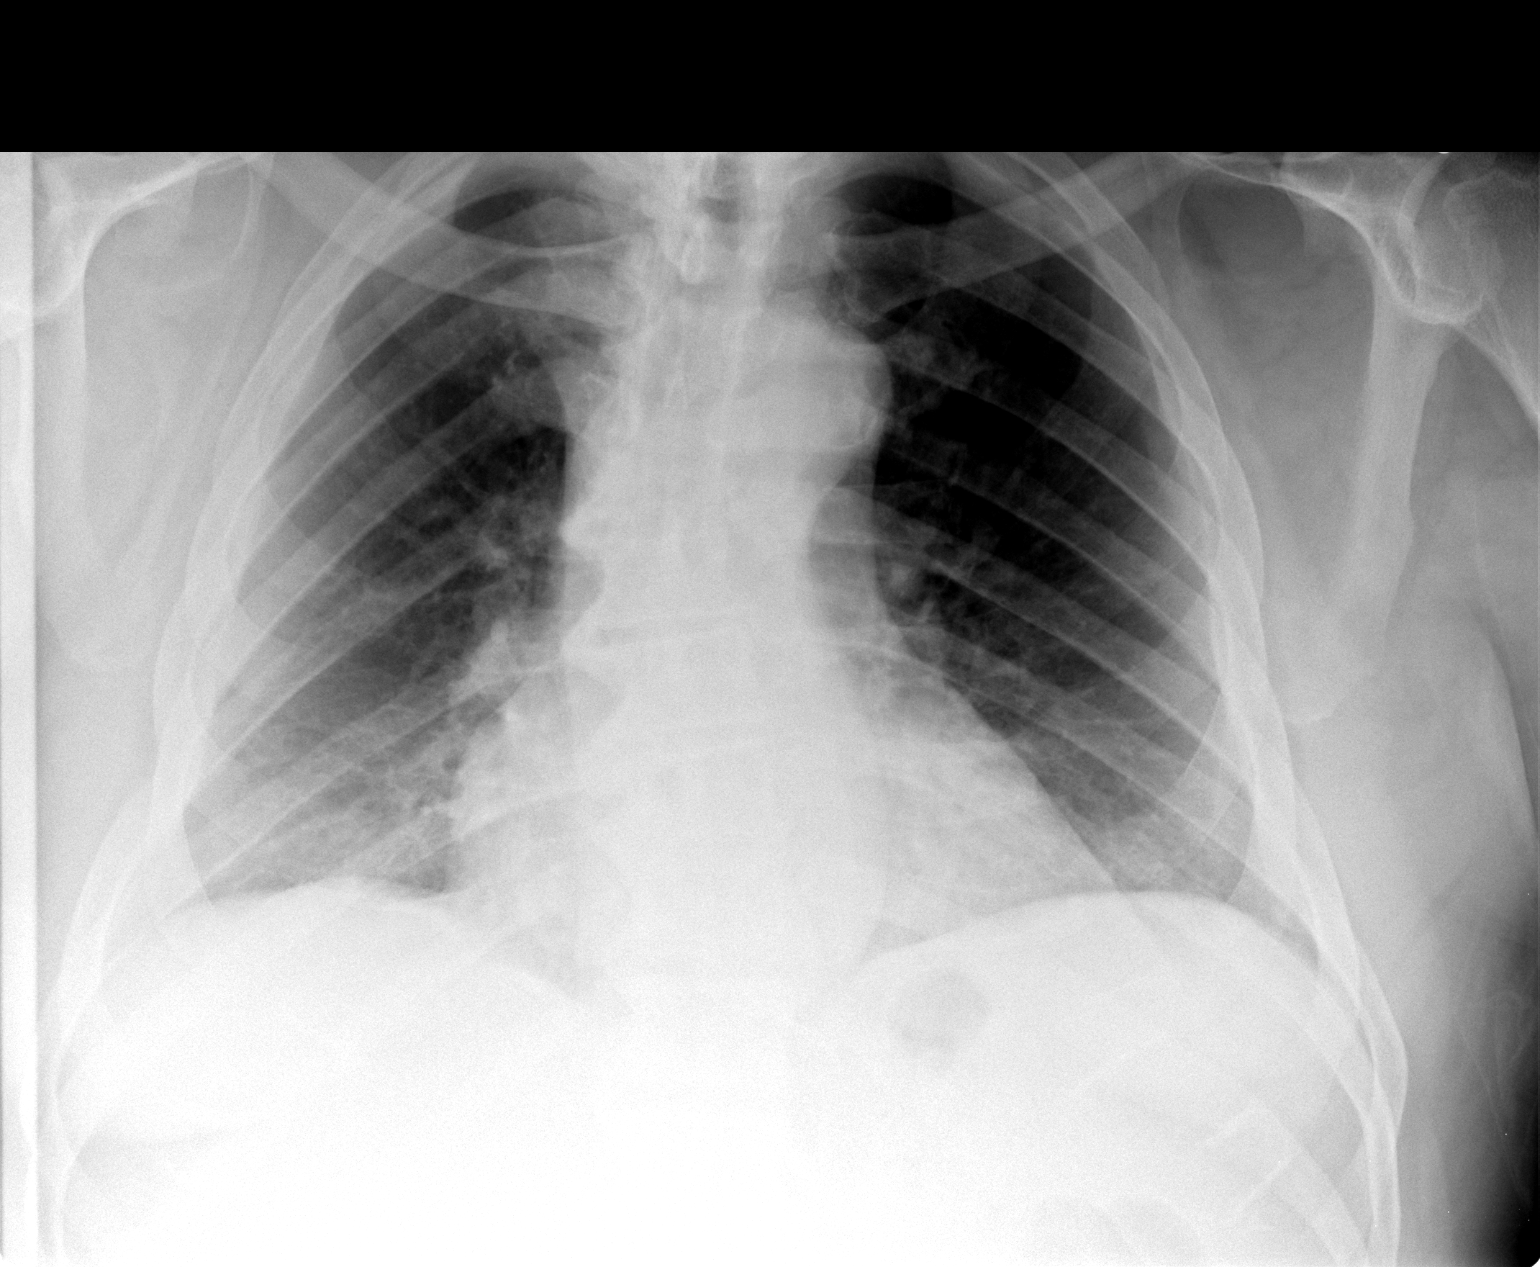

[view not recorded (2 of 2)]
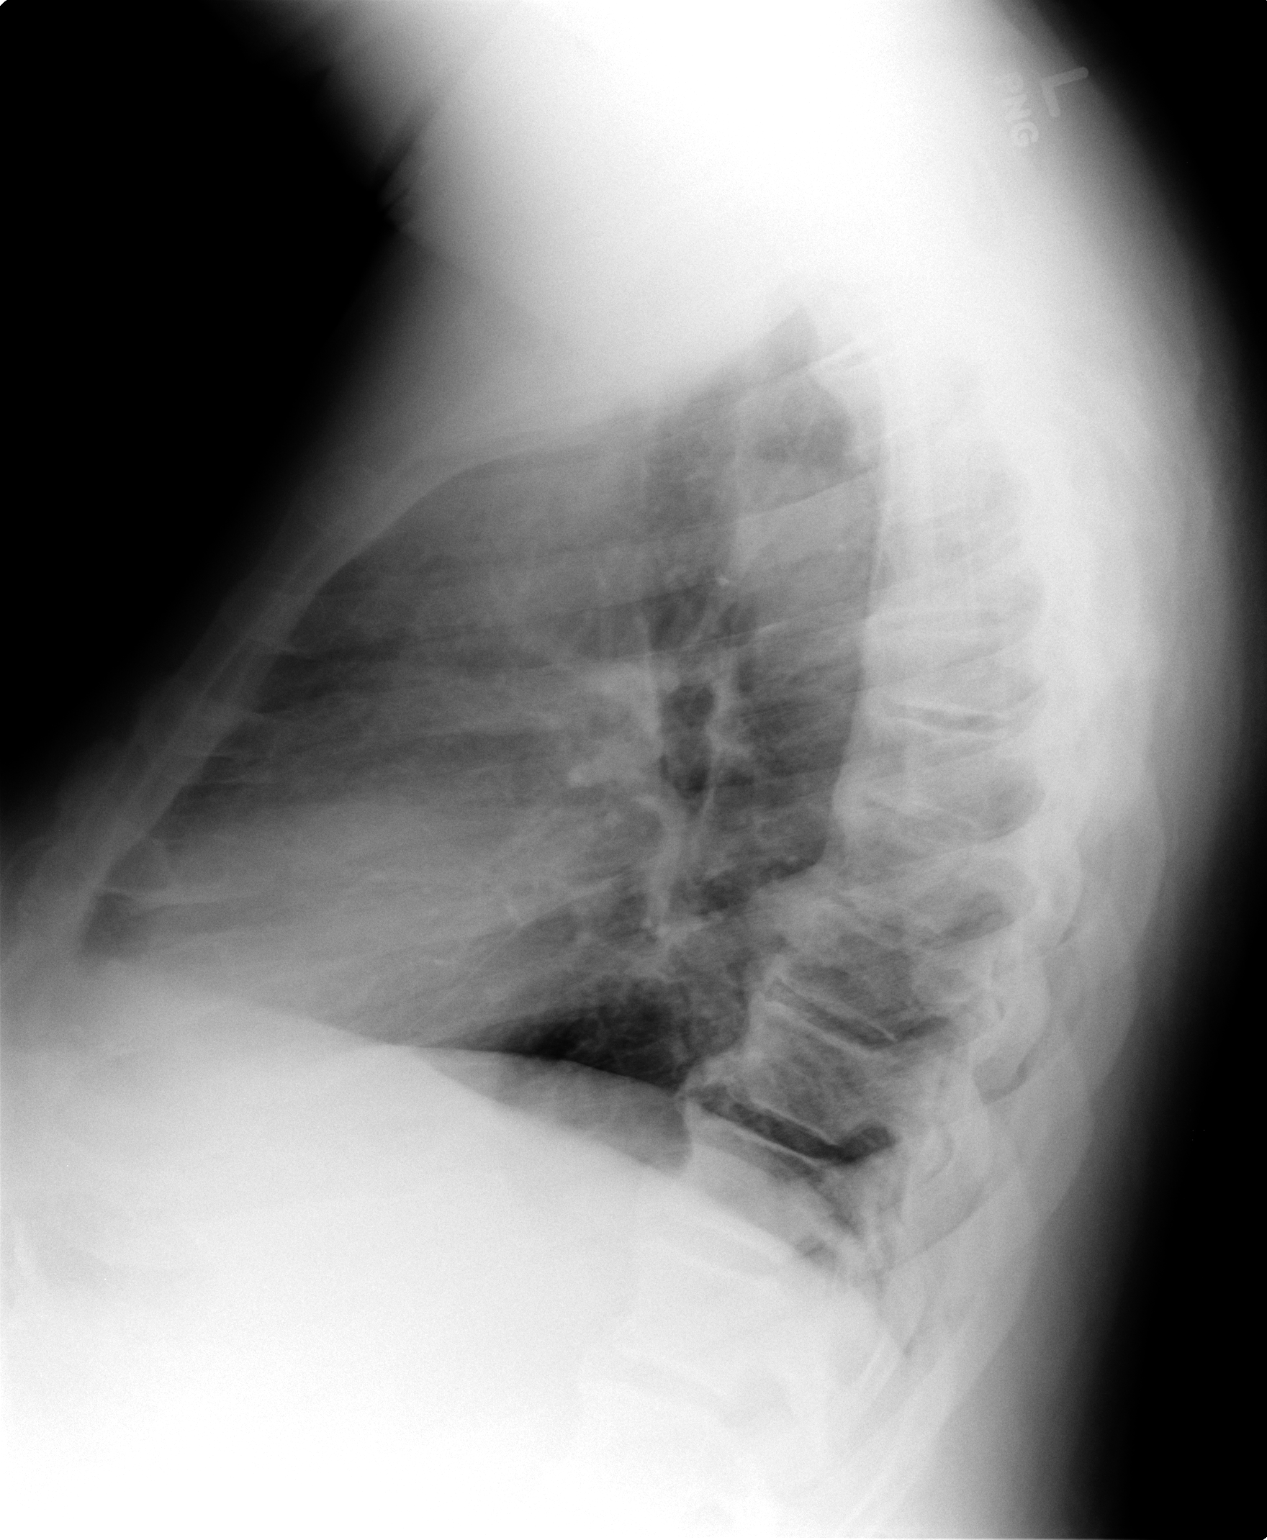

[2 of 2 positions shown; findings below may reference images not displayed]

FINDINGS: Cardiac silhouette normal in size and configuration. The aorta is
tortuous. No mediastinal or hilar masses or evidence of adenopathy.

Clear lungs.  No pleural effusion or pneumothorax.

Bony thorax is intact.
IMPRESSION: No active cardiopulmonary disease.

## 2016-09-05 LAB — BASIC METABOLIC PANEL
BUN: 16 mg/dL (ref 4–21)
CREATININE: 1.3 mg/dL (ref 0.6–1.3)
Glucose: 115 mg/dL
Potassium: 4.4 mmol/L (ref 3.4–5.3)
SODIUM: 141 mmol/L (ref 137–147)

## 2016-09-05 LAB — HEPATIC FUNCTION PANEL
ALT: 17 U/L (ref 10–40)
AST: 19 U/L (ref 14–40)
Alkaline Phosphatase: 87 U/L (ref 25–125)
BILIRUBIN, TOTAL: 0.4 mg/dL

## 2016-09-05 LAB — CBC AND DIFFERENTIAL
HEMATOCRIT: 35 % — AB (ref 41–53)
Hemoglobin: 10.8 g/dL — AB (ref 13.5–17.5)
Platelets: 261 10*3/uL (ref 150–399)
WBC: 6.2 10^3/mL

## 2016-09-05 LAB — HEMOGLOBIN A1C: Hemoglobin A1C: 7.6

## 2016-09-05 LAB — LIPID PANEL
Cholesterol: 144 mg/dL (ref 0–200)
HDL: 41 mg/dL (ref 35–70)
LDL Cholesterol: 86 mg/dL
TRIGLYCERIDES: 169 mg/dL — AB (ref 40–160)

## 2016-09-05 LAB — MICROALBUMIN, URINE: MICROALB UR: 420

## 2016-09-13 ENCOUNTER — Telehealth: Payer: Self-pay | Admitting: Internal Medicine

## 2016-09-13 DIAGNOSIS — L602 Onychogryphosis: Secondary | ICD-10-CM

## 2016-09-13 NOTE — Telephone Encounter (Signed)
Referral placed.

## 2016-09-13 NOTE — Telephone Encounter (Signed)
Pt would like a referral to a podiatrist.  He is not able to cut his toes nails any longer

## 2016-09-14 ENCOUNTER — Encounter: Payer: Self-pay | Admitting: Internal Medicine

## 2016-09-14 NOTE — Progress Notes (Unsigned)
Results entered and sent to scan  

## 2016-10-12 ENCOUNTER — Encounter: Payer: Self-pay | Admitting: Podiatry

## 2016-10-12 ENCOUNTER — Ambulatory Visit (INDEPENDENT_AMBULATORY_CARE_PROVIDER_SITE_OTHER): Payer: Medicare Other | Admitting: Podiatry

## 2016-10-12 VITALS — BP 192/111 | HR 63

## 2016-10-12 DIAGNOSIS — E119 Type 2 diabetes mellitus without complications: Secondary | ICD-10-CM

## 2016-10-12 DIAGNOSIS — M79676 Pain in unspecified toe(s): Secondary | ICD-10-CM | POA: Diagnosis not present

## 2016-10-12 DIAGNOSIS — B351 Tinea unguium: Secondary | ICD-10-CM | POA: Diagnosis not present

## 2016-10-12 NOTE — Progress Notes (Signed)
   Subjective:    Patient ID: [name] [name], male    DOB: January 06, 1934, 83 y.o.   MRN: [id number]  HPI this patient presents the office with chief complaint of painful big toes on both feet. Patient states that the nails and the big toes are painful walking and wearing his shoes. Patient states that he is unable to self treat due to back problems. This patient is type 1 diabetes and presents the office today for an evaluation and treatment of his feet    Review of Systems  Musculoskeletal: Positive for back pain.       Muscle pain  Skin:       Change in nails  Hematological: Bruises/bleeds easily.       Objective:   Physical Exam GENERAL APPEARANCE: Alert, conversant. Appropriately groomed. No acute distress.  VASCULAR: Pedal pulses are  palpable at  [facility] and PT bilateral.  Capillary refill time is immediate to all digits,  Normal temperature gradient.  Digital hair growth is present bilateral  NEUROLOGIC: sensation is normal to 5.07 monofilament at 5/5 sites bilateral.  Light touch is intact bilateral, Muscle strength normal.  MUSCULOSKELETAL: acceptable muscle strength, tone and stability bilateral.  Hallux limitus 1st MPJ  B/L  DERMATOLOGIC: skin color, texture, and turgor are within normal limits.  No preulcerative lesions or ulcers  are seen, no interdigital maceration noted.  No open lesions present.   No drainage noted. NAILS  thick disfigured discolored toenails, both feet. Marked incurvation noted on the hallux toenails, both feet. No evidence of any redness, swelling or infection         Assessment & Plan:  Onychomycosis  B/L  Diabetes with no foot complications.  IE  Debride nails.  RTC 3 months.   Gardiner Barefoot DPM

## 2016-11-09 ENCOUNTER — Ambulatory Visit: Payer: Medicare Other | Admitting: Internal Medicine

## 2016-12-02 ENCOUNTER — Other Ambulatory Visit: Payer: Self-pay | Admitting: Internal Medicine

## 2016-12-22 DIAGNOSIS — N5201 Erectile dysfunction due to arterial insufficiency: Secondary | ICD-10-CM | POA: Diagnosis not present

## 2016-12-22 DIAGNOSIS — Z8551 Personal history of malignant neoplasm of bladder: Secondary | ICD-10-CM | POA: Diagnosis not present

## 2016-12-26 ENCOUNTER — Encounter: Payer: Self-pay | Admitting: Internal Medicine

## 2016-12-26 LAB — URINALYSIS W MICROSCOPIC (NOT AT ARMC)
Bilirubin: NEGATIVE
Glucose: NEGATIVE
KETONE: NEGATIVE
NITRITES UR: NEGATIVE
SPECIFIC GRAVITY: 1.025
UROBILINOGEN UA: NORMAL
pH: 6.5

## 2016-12-26 NOTE — Progress Notes (Signed)
Abstracted and sent to scan  

## 2017-01-11 LAB — CBC AND DIFFERENTIAL
HCT: 37 — AB (ref 41–53)
HEMOGLOBIN: 11.4 — AB (ref 13.5–17.5)
PLATELETS: 237 (ref 150–399)
WBC: 8.4

## 2017-01-11 LAB — BASIC METABOLIC PANEL
BUN: 20 (ref 4–21)
CREATININE: 1.5 — AB (ref 0.6–1.3)
Glucose: 128
POTASSIUM: 4.3 (ref 3.4–5.3)
Sodium: 140 (ref 137–147)

## 2017-02-02 ENCOUNTER — Ambulatory Visit: Payer: Medicare Other | Admitting: Internal Medicine

## 2017-02-08 ENCOUNTER — Ambulatory Visit (INDEPENDENT_AMBULATORY_CARE_PROVIDER_SITE_OTHER): Payer: Medicare Other | Admitting: Podiatry

## 2017-02-08 ENCOUNTER — Encounter: Payer: Self-pay | Admitting: Podiatry

## 2017-02-08 ENCOUNTER — Encounter: Payer: Self-pay | Admitting: Internal Medicine

## 2017-02-08 DIAGNOSIS — E119 Type 2 diabetes mellitus without complications: Secondary | ICD-10-CM

## 2017-02-08 DIAGNOSIS — B351 Tinea unguium: Secondary | ICD-10-CM | POA: Diagnosis not present

## 2017-02-08 DIAGNOSIS — M79676 Pain in unspecified toe(s): Secondary | ICD-10-CM | POA: Diagnosis not present

## 2017-02-08 LAB — URINALYSIS W MICROSCOPIC (NOT AT ARMC)
BILIRUBIN: NEGATIVE
GLUCOSE: NEGATIVE
Ketone: NEGATIVE
NITRITE: NEGATIVE
PH: 6
PROTEIN: NEGATIVE
SPECIFIC GRAVITY: 1.008

## 2017-02-08 NOTE — Progress Notes (Signed)
Complaint:  Visit Type: Patient returns to my office for continued preventative foot care services. Complaint: Patient states" my nails have grown long and thick and become painful to walk and wear shoes" Patient has been diagnosed with DM with no foot complications. The patient presents for preventative foot care services. No changes to ROS  Podiatric Exam: Vascular: dorsalis pedis and posterior tibial pulses are palpable bilateral. Capillary return is immediate. Temperature gradient is WNL. Skin turgor WNL  Sensorium: Normal Semmes Weinstein monofilament test. Normal tactile sensation bilaterally. Nail Exam: Pt has thick disfigured discolored nails with subungual debris noted bilateral entire nail hallux through fifth toenails.  Incurvation hallux nails  B/L. Ulcer Exam: There is no evidence of ulcer or pre-ulcerative changes or infection. Orthopedic Exam: Muscle tone and strength are WNL. No limitations in general ROM. No crepitus or effusions noted. Foot type and digits show no abnormalities. Bony prominences are unremarkable. Skin: No Porokeratosis. No infection or ulcers  Diagnosis:  Onychomycosis, , Pain in right toe, pain in left toes  Treatment & Plan Procedures and Treatment: Consent by patient was obtained for treatment procedures.   Debridement of mycotic and hypertrophic toenails, 1 through 5 bilateral and clearing of subungual debris. No ulceration, no infection noted.  Return Visit-Office Procedure: Patient instructed to return to the office for a follow up visit 3  months.  for continued evaluation and treatment.    Kateria Cutrona DPM 

## 2017-02-10 ENCOUNTER — Ambulatory Visit (INDEPENDENT_AMBULATORY_CARE_PROVIDER_SITE_OTHER): Payer: Medicare Other | Admitting: Internal Medicine

## 2017-02-10 ENCOUNTER — Encounter: Payer: Self-pay | Admitting: Internal Medicine

## 2017-02-10 VITALS — BP 146/70 | HR 58 | Temp 98.4°F | Ht 71.0 in | Wt 247.0 lb

## 2017-02-10 DIAGNOSIS — Z23 Encounter for immunization: Secondary | ICD-10-CM

## 2017-02-10 DIAGNOSIS — J3089 Other allergic rhinitis: Secondary | ICD-10-CM | POA: Diagnosis not present

## 2017-02-10 MED ORDER — MONTELUKAST SODIUM 10 MG PO TABS: 10.0000 mg | ORAL_TABLET | Freq: Every day | ORAL | 3 refills | Status: DC

## 2017-02-10 MED ORDER — HYDROCODONE-HOMATROPINE 5-1.5 MG/5ML PO SYRP: 5.0000 mL | ORAL_SOLUTION | Freq: Three times a day (TID) | ORAL | 0 refills | Status: DC | PRN

## 2017-02-10 NOTE — Patient Instructions (Signed)
We have sent in the medicine for the dripping called montelukast to take 1 pill at night time.   We have given you the cough medicine.

## 2017-02-10 NOTE — Progress Notes (Signed)
   Subjective:    Patient ID: [name] [name], male    DOB: 06/03/1933, 83 y.o.   MRN: [id number]  HPI The patient is an 83 YO man coming in for night time cough. He does have drainage from his nose during the night time and some cough with sputum in the morning. Mostly clear drainage. Going on for months. No fevers or chills. No sick contacts. No weight change. Former smoker.   Review of Systems  Constitutional: Negative.   HENT: Positive for postnasal drip and rhinorrhea. Negative for congestion, ear discharge, ear pain, nosebleeds, sinus pain, sinus pressure, sore throat and trouble swallowing.   Eyes: Negative.   Respiratory: Positive for cough. Negative for chest tightness and shortness of breath.   Cardiovascular: Negative for chest pain, palpitations and leg swelling.  Gastrointestinal: Negative for abdominal distention, abdominal pain, constipation, diarrhea, nausea and vomiting.  Musculoskeletal: Negative.   Skin: Negative.   Neurological: Negative.       Objective:   Physical Exam  Constitutional: He is oriented to person, place, and time. He appears well-developed and well-nourished.  HENT:  Head: Normocephalic and atraumatic.  Oropharynx with clear drainage and mild erythema.   Eyes: EOM are normal.  Neck: Normal range of motion.  Cardiovascular: Normal rate and regular rhythm.   Pulmonary/Chest: Effort normal and breath sounds normal. No respiratory distress. He has no wheezes. He has no rales.  Abdominal: Soft. Bowel sounds are normal. He exhibits no distension. There is no tenderness. There is no rebound.  Musculoskeletal: He exhibits no edema.  Neurological: He is alert and oriented to person, place, and time. Coordination normal.  Skin: Skin is warm and dry.   Vitals:   02/10/17 1504  BP: (!) 146/70  Pulse: (!) 58  Temp: 98.4 F (36.9 C)  TempSrc: Oral  SpO2: 98%  Weight: 247 lb (112 kg)  Height: 5\' 11"  (1.803 m)      Assessment & Plan:  Flu shot given  at visit.

## 2017-02-10 NOTE — Assessment & Plan Note (Signed)
Likely the cause of the cough. He is using nasacort and zyrtec and will add montelukast today which is prescribed.

## 2017-03-01 ENCOUNTER — Other Ambulatory Visit: Payer: Self-pay | Admitting: Family

## 2017-03-09 ENCOUNTER — Other Ambulatory Visit: Payer: Self-pay | Admitting: Internal Medicine

## 2017-03-09 NOTE — Telephone Encounter (Signed)
Routing to dr crawford, the last appt addressing the need for this med was 2015---are you ok with refilling, please advise, thanks

## 2017-05-03 LAB — BASIC METABOLIC PANEL
BUN: 16 (ref 4–21)
Creatinine: 1.3 (ref 0.6–1.3)
Glucose: 77
Potassium: 4.2 (ref 3.4–5.3)
SODIUM: 142 (ref 137–147)

## 2017-05-03 LAB — LIPID PANEL
CHOLESTEROL: 144 (ref 0–200)
HDL: 44 (ref 35–70)
LDL CALC: 80
TRIGLYCERIDES: 206 — AB (ref 40–160)

## 2017-05-03 LAB — CBC AND DIFFERENTIAL
HEMATOCRIT: 36 — AB (ref 41–53)
HEMOGLOBIN: 11 — AB (ref 13.5–17.5)
PLATELETS: 237 (ref 150–399)
WBC: 8.2

## 2017-05-03 LAB — HEMOGLOBIN A1C: HEMOGLOBIN A1C: 7

## 2017-05-03 LAB — MICROALBUMIN, URINE: MICROALB UR: 621

## 2017-05-10 ENCOUNTER — Ambulatory Visit (INDEPENDENT_AMBULATORY_CARE_PROVIDER_SITE_OTHER): Payer: Medicare Other | Admitting: Podiatry

## 2017-05-10 ENCOUNTER — Encounter: Payer: Self-pay | Admitting: Podiatry

## 2017-05-10 DIAGNOSIS — M79676 Pain in unspecified toe(s): Secondary | ICD-10-CM

## 2017-05-10 DIAGNOSIS — E119 Type 2 diabetes mellitus without complications: Secondary | ICD-10-CM

## 2017-05-10 DIAGNOSIS — B351 Tinea unguium: Secondary | ICD-10-CM

## 2017-05-10 NOTE — Progress Notes (Signed)
Complaint:  Visit Type: Patient returns to my office for continued preventative foot care services. Complaint: Patient states" my nails have grown long and thick and become painful to walk and wear shoes" Patient has been diagnosed with DM with no foot complications. The patient presents for preventative foot care services. No changes to ROS  Podiatric Exam: Vascular: dorsalis pedis and posterior tibial pulses are palpable bilateral. Capillary return is immediate. Temperature gradient is WNL. Skin turgor WNL  Sensorium: Normal Semmes Weinstein monofilament test. Normal tactile sensation bilaterally. Nail Exam: Pt has thick disfigured discolored nails with subungual debris noted bilateral entire nail hallux through fifth toenails.  Incurvation hallux nails  B/L. Ulcer Exam: There is no evidence of ulcer or pre-ulcerative changes or infection. Orthopedic Exam: Muscle tone and strength are WNL. No limitations in general ROM. No crepitus or effusions noted. Foot type and digits show no abnormalities. Bony prominences are unremarkable. Skin: No Porokeratosis. No infection or ulcers  Diagnosis:  Onychomycosis, , Pain in right toe, pain in left toes  Treatment & Plan Procedures and Treatment: Consent by patient was obtained for treatment procedures.   Debridement of mycotic and hypertrophic toenails, 1 through 5 bilateral and clearing of subungual debris. No ulceration, no infection noted.  Return Visit-Office Procedure: Patient instructed to return to the office for a follow up visit 3  months.  for continued evaluation and treatment.    Carr Shartzer DPM 

## 2017-05-25 ENCOUNTER — Encounter: Payer: Self-pay | Admitting: Internal Medicine

## 2017-05-25 NOTE — Progress Notes (Signed)
Abstracted and sent to scan  

## 2017-07-25 ENCOUNTER — Encounter: Payer: Self-pay | Admitting: Internal Medicine

## 2017-07-25 ENCOUNTER — Ambulatory Visit (INDEPENDENT_AMBULATORY_CARE_PROVIDER_SITE_OTHER): Payer: Medicare Other | Admitting: Internal Medicine

## 2017-07-25 DIAGNOSIS — J40 Bronchitis, not specified as acute or chronic: Secondary | ICD-10-CM

## 2017-07-25 MED ORDER — DOXYCYCLINE HYCLATE 100 MG PO TABS: 100.0000 mg | ORAL_TABLET | Freq: Two times a day (BID) | ORAL | 0 refills | Status: DC

## 2017-07-25 MED ORDER — HYDROCODONE-HOMATROPINE 5-1.5 MG/5ML PO SYRP: 5.0000 mL | ORAL_SOLUTION | Freq: Four times a day (QID) | ORAL | 0 refills | Status: DC | PRN

## 2017-07-25 NOTE — Patient Instructions (Addendum)
We have sent in the doxycycline to take 1 pill twice a day for 1 week.   You should be taking the singulair, nasacort for the next 2 weeks.   We have sent in more of the cough syrup.

## 2017-07-25 NOTE — Progress Notes (Signed)
   Subjective:    Patient ID: [name] [name], male    DOB: 1934/02/02, 84 y.o.   MRN: [id number]  HPI The patient is an 84 YO man coming in for sinus problems and cough going on for about 1 month. He was feeling unwell for about 1 week and then those symptoms improved fully. He then started getting sick again about 1 week ago. He started with congestion and cough and SOB. He almost went to the ER last night due to SOB. He is not having much SOB during the day. Congestion and nasal drainage as well. Uses CPAP and has still been using. Denies fevers or chills. Denies headaches or muscle aches. Denies known sick contacts. He is having sore throat and ear pain and sinus fullness. Overall worsening in the last week or so. Tried old hydromet cough syrup which was helpful. Taking tylenol and chloroseptic which were not very helpful. Some sputum.   Review of Systems  Constitutional: Positive for activity change, appetite change and chills. Negative for fatigue, fever and unexpected weight change.  HENT: Positive for congestion, ear pain, postnasal drip, rhinorrhea, sinus pressure and sore throat. Negative for ear discharge, sinus pain, sneezing, tinnitus, trouble swallowing and voice change.   Eyes: Negative.   Respiratory: Positive for cough, chest tightness and shortness of breath. Negative for wheezing.   Cardiovascular: Positive for chest pain.  Gastrointestinal: Negative.   Musculoskeletal: Positive for myalgias.  Neurological: Negative.       Objective:   Physical Exam  Constitutional: He is oriented to person, place, and time. He appears well-developed and well-nourished.  HENT:  Head: Normocephalic and atraumatic.  Oropharynx with redness and clear drainage, nose with swollen turbinates, TMs normal bilaterally  Eyes: EOM are normal.  Neck: Normal range of motion. No thyromegaly present.  Cardiovascular: Normal rate and regular rhythm.  Pulmonary/Chest: Effort normal and breath sounds  normal. No respiratory distress. He has no wheezes. He has no rales.  Rhonchi at the bases which does not clear with coughing  Abdominal: Soft.  Musculoskeletal: He exhibits tenderness.  Lymphadenopathy:    He has no cervical adenopathy.  Neurological: He is alert and oriented to person, place, and time.  Skin: Skin is warm and dry.   Vitals:   07/25/17 1431  BP: (!) 162/70  Pulse: 72  Temp: 99.9 F (37.7 C)  TempSrc: Oral  SpO2: 96%  Weight: 254 lb (115.2 kg)  Height: 5\' 11"  (1.803 m)      Assessment & Plan:

## 2017-07-25 NOTE — Assessment & Plan Note (Signed)
Rx for doxycycline for the ongoing fevers with sputum production. Rx for hydrocodone cough syrup for cough. Review of [facility] narcotic database prior to prescription reveals no red flag or inappropriate prescriptions.

## 2017-08-09 ENCOUNTER — Encounter: Payer: Self-pay | Admitting: Podiatry

## 2017-08-09 ENCOUNTER — Ambulatory Visit (INDEPENDENT_AMBULATORY_CARE_PROVIDER_SITE_OTHER): Payer: Medicare Other | Admitting: Podiatry

## 2017-08-09 DIAGNOSIS — E119 Type 2 diabetes mellitus without complications: Secondary | ICD-10-CM | POA: Diagnosis not present

## 2017-08-09 DIAGNOSIS — B351 Tinea unguium: Secondary | ICD-10-CM

## 2017-08-09 DIAGNOSIS — M79676 Pain in unspecified toe(s): Secondary | ICD-10-CM | POA: Diagnosis not present

## 2017-08-09 NOTE — Progress Notes (Signed)
Complaint:  Visit Type: Patient returns to my office for continued preventative foot care services. Complaint: Patient states" my nails have grown long and thick and become painful to walk and wear shoes" Patient has been diagnosed with DM with no foot complications. The patient presents for preventative foot care services. No changes to ROS  Podiatric Exam: Vascular: dorsalis pedis and posterior tibial pulses are palpable bilateral. Capillary return is immediate. Temperature gradient is WNL. Skin turgor WNL  Sensorium: Normal Semmes Weinstein monofilament test. Normal tactile sensation bilaterally. Nail Exam: Pt has thick disfigured discolored nails with subungual debris noted bilateral entire nail hallux through fifth toenails.  Incurvation hallux nails  B/L. Ulcer Exam: There is no evidence of ulcer or pre-ulcerative changes or infection. Orthopedic Exam: Muscle tone and strength are WNL. No limitations in general ROM. No crepitus or effusions noted. Foot type and digits show no abnormalities. Bony prominences are unremarkable. Skin: No Porokeratosis. No infection or ulcers  Diagnosis:  Onychomycosis, , Pain in right toe, pain in left toes  Treatment & Plan Procedures and Treatment: Consent by patient was obtained for treatment procedures.   Debridement of mycotic and hypertrophic toenails, 1 through 5 bilateral and clearing of subungual debris. No ulceration, no infection noted.  Return Visit-Office Procedure: Patient instructed to return to the office for a follow up visit 3  months.  for continued evaluation and treatment.    Cashlynn Yearwood DPM 

## 2017-09-02 ENCOUNTER — Other Ambulatory Visit: Payer: Self-pay | Admitting: Internal Medicine

## 2017-09-27 ENCOUNTER — Telehealth: Payer: Self-pay | Admitting: Gastroenterology

## 2017-09-28 NOTE — Telephone Encounter (Signed)
Dr.Armbruster reviewed records and noted it is okay to schedule direct colonoscopy for Hx of polyps also with rectal bleeding, and Hx of iron deficiency. Spoke with patient and notified him of this. Patient is diabetic and needs a morning appointment. Will call patient when Dr.Armbruster's August calendar is available to schedule a morning colonoscopy.

## 2017-10-10 ENCOUNTER — Encounter: Payer: Self-pay | Admitting: Gastroenterology

## 2017-11-08 ENCOUNTER — Ambulatory Visit (INDEPENDENT_AMBULATORY_CARE_PROVIDER_SITE_OTHER): Payer: Medicare Other | Admitting: Podiatry

## 2017-11-08 ENCOUNTER — Encounter: Payer: Self-pay | Admitting: Podiatry

## 2017-11-08 DIAGNOSIS — E119 Type 2 diabetes mellitus without complications: Secondary | ICD-10-CM | POA: Diagnosis not present

## 2017-11-08 DIAGNOSIS — B351 Tinea unguium: Secondary | ICD-10-CM

## 2017-11-08 DIAGNOSIS — M79676 Pain in unspecified toe(s): Secondary | ICD-10-CM

## 2017-11-08 NOTE — Progress Notes (Signed)
Complaint:  Visit Type: Patient returns to my office for continued preventative foot care services. Complaint: Patient states" my nails have grown long and thick and become painful to walk and wear shoes" Patient has been diagnosed with DM with no foot complications. The patient presents for preventative foot care services. No changes to ROS  Podiatric Exam: Vascular: dorsalis pedis and posterior tibial pulses are palpable bilateral. Capillary return is immediate. Temperature gradient is WNL. Skin turgor WNL  Sensorium: Normal Semmes Weinstein monofilament test. Normal tactile sensation bilaterally. Nail Exam: Pt has thick disfigured discolored nails with subungual debris noted bilateral entire nail hallux through fifth toenails.  Incurvation hallux nails  B/L. Ulcer Exam: There is no evidence of ulcer or pre-ulcerative changes or infection. Orthopedic Exam: Muscle tone and strength are WNL. No limitations in general ROM. No crepitus or effusions noted. Foot type and digits show no abnormalities. Bony prominences are unremarkable. Skin: No Porokeratosis. No infection or ulcers  Diagnosis:  Onychomycosis, , Pain in right toe, pain in left toes  Treatment & Plan Procedures and Treatment: Consent by patient was obtained for treatment procedures.   Debridement of mycotic and hypertrophic toenails, 1 through 5 bilateral and clearing of subungual debris. No ulceration, no infection noted.  Return Visit-Office Procedure: Patient instructed to return to the office for a follow up visit 3  months.  for continued evaluation and treatment.    Daune Colgate DPM 

## 2017-11-20 ENCOUNTER — Ambulatory Visit: Payer: Self-pay

## 2017-11-20 NOTE — Telephone Encounter (Signed)
Noted  

## 2017-11-20 NOTE — Telephone Encounter (Signed)
Pt. called to report c/o stiff neck and dizziness since Saturday.  Also c/o upset stomach.  Described dizziness as "feeling woozy." Denied that the room was spinning or tilting.   Did state that he been having dizziness for short time after taking Atenolol in the morning.  Stated he feels like he could faint, if he pushes himself too much.  Denied having any episodes of near fainting.  c/o cough for one week that is worse at night.  Denied fever.  Reported has been taking Tylenol, prn, for the stiff neck, and pain in back and shoulders.  Able to touch chin to chest when questioned.    Stated he has noticed blurred vision in both eyes, when in bright sunlight over past 2 days.  Reported he had a cataract removed (R) eye on 5/24. Reported the vision has cleared since he has been inside, and out of bright sunlight.  Denied headache, weakness in unilateral extremities, speech problems, or confusion.  Reported BP 132/63, pulse 60 at 10:00 AM.  Checked BP at 2:56 PM: 194/96, pulse 60, and 2:58 PM: 191/87, pulse 59.  Stated his blood pressure is not usually elevated like this.  Appt. sched with PCP 7/9 @ 9:40 AM.  Pt. also stated he has updated blood work to report to Dr. Sharlet Salina, from the [facility]; was advised he is anemic.  Care advice per protocol.  Encouraged to call office or 911 if symptoms worsen.  Verb. understanding; agrees with plan.                 Reason for Disposition . [1] MODERATE dizziness (e.g., interferes with normal activities) AND [2] has NOT been evaluated by physician for this  (Exception: dizziness caused by heat exposure, sudden standing, or poor fluid intake) . [1] MODERATE neck pain (e.g., interferes with normal activities AND [2] present > 3 days . [1] Brief (now gone) blurred vision AND [2] unexplained    Reported had cataract removed from right eye 5/24; noted blurred vision in both eyes while in bright sunlight the past 2 days.  Improved vision when out of bright sunlight.  Answer  Assessment - Initial Assessment Questions 1. ONSET: "When did the pain begin?"      Saturday afternoon-stiff neck  2. LOCATION: "Where does it hurt?"     All around the neck   3. PATTERN "Does the pain come and go, or has it been constant since it started?"      Constant stiffness  4. SEVERITY: "How bad is the pain?"  (Scale 1-10; or mild, moderate, severe)   - MILD (1-3): doesn't interfere with normal activities    - MODERATE (4-7): interferes with normal activities or awakens from sleep    - SEVERE (8-10):  excruciating pain, unable to do any normal activities      Difficult for pt. To answer; pain occurs with moving neck to a certain point; stated he is able to touch chin to chest.  5. RADIATION: "Does the pain go anywhere else, shoot into your arms?"     Can't turn my head 6. CORD SYMPTOMS: "Any weakness or numbness of the arms or legs?"     Denied numbness/ weakness of extremities  7. CAUSE: "What do you think is causing the neck pain?"     Unknown ; though he must have laid wrong 8. NECK OVERUSE: "Any recent activities that involved turning or twisting the neck?"     Denied any strain 9. OTHER SYMPTOMS: "Do you  have any other symptoms?" (e.g., headache, fever, chest pain, difficulty breathing, neck swelling)     Light headed x 2 days; it lasts all day; reported he gets lightheaded in the morning with his BP medication-Atenolol  Answer Assessment - Initial Assessment Questions 1. DESCRIPTION: "Describe your dizziness."     Feels like he could pass out ; started on Saturday 2. LIGHTHEADED: "Do you feel lightheaded?" (e.g., somewhat faint, woozy, weak upon standing)     Woozy feeling ; motion sickness  3. VERTIGO: "Do you feel like either you or the room is spinning or tilting?" (i.e. vertigo)    Denied  4. SEVERITY: "How bad is it?"  "Do you feel like you are going to faint?" "Can you stand and walk?"   - MILD - walking normally   - MODERATE - interferes with normal activities  (e.g., work, school)    - SEVERE - unable to stand, requires support to walk, feels like passing out now.      Moderate; doesn't feel comfortable driving  5. ONSET:  "When did the dizziness begin?"     Saturday  6. AGGRAVATING FACTORS: "Does anything make it worse?" (e.g., standing, change in head position)     Moving around aggravates it; feels better if he lays down 7. HEART RATE: "Can you tell me your heart rate?" "How many beats in 15 seconds?"  (Note: not all patients can do this)      Pulse 60, BP 132/63 @ 10:00 AM; Pulse 60, BP 194/96 @ 2:56; and Pulse 59, BP 191/87 @ 2:58 PM 8. CAUSE: "What do you think is causing the dizziness?"     unknown 9. RECURRENT SYMPTOM: "Have you had dizziness before?" If so, ask: "When was the last time?" "What happened that time?"     Has had dizziness in the past; was informed he was dehydrated at that time.  10. OTHER SYMPTOMS: "Do you have any other symptoms?" (e.g., fever, chest pain, vomiting, diarrhea, bleeding)       Upset stomach, c/o shortness of breath; feels like he has a cold;c/o coughing at night over the past week; c/o intermittent runny nose; denied chest pain; denied fever; Temp. 98.2 At 2:30 PM, taken just before the call.  Answer Assessment - Initial Assessment Questions 1. DESCRIPTION: "What is the vision loss like? Describe it for me." (e.g., complete vision loss, blurred vision, double vision, floaters, etc.)     Blurry vision  2. LOCATION: "One or both eyes?" If one, ask: "Which eye?"     Both  3. SEVERITY: "Can you see anything?" If so, ask: "What can you see?" (e.g., fine print)     Blurry over past 2 days with bright light when outside 4. ONSET: "When did this begin?" "Did it start suddenly or has this been gradual?"     When outside past 2 days 5. PATTERN: "Does this come and go, or has it been constant since it started?"    Vision has improved since inside his home 6. PAIN: "Is there any pain in your eye(s)?"  (Scale 1-10; or  mild, moderate, severe)     No  7. CONTACTS-GLASSES: "Do you wear contacts or glasses?"     Glasses  8. CAUSE: "What do you think is causing this visual problem?"     Cataract removal of right eye 5/24   9. OTHER SYMPTOMS: "Do you have any other symptoms?" (e.g., confusion, headache, arm or leg weakness, speech problems)     Denied confusion ,  weakness of extremities, or speech problems.  Protocols used: DIZZINESS - LIGHTHEADEDNESS-A-AH, NECK PAIN OR STIFFNESS-A-AH, VISION LOSS OR CHANGE-A-AH

## 2017-11-21 ENCOUNTER — Ambulatory Visit (INDEPENDENT_AMBULATORY_CARE_PROVIDER_SITE_OTHER): Payer: Medicare Other | Admitting: Internal Medicine

## 2017-11-21 ENCOUNTER — Encounter: Payer: Self-pay | Admitting: Internal Medicine

## 2017-11-21 DIAGNOSIS — M542 Cervicalgia: Secondary | ICD-10-CM | POA: Diagnosis not present

## 2017-11-21 DIAGNOSIS — I10 Essential (primary) hypertension: Secondary | ICD-10-CM

## 2017-11-21 DIAGNOSIS — R42 Dizziness and giddiness: Secondary | ICD-10-CM | POA: Diagnosis not present

## 2017-11-21 NOTE — Progress Notes (Signed)
   Subjective:    Patient ID: [name] [name], male    DOB: [birth year], 84 y.o.   MRN: [id number]  HPI The patient is an 84 YO man coming in for dizziness. Sometimes feels like he is going to pass out. Normally this happens most often in the morning after taking his atenolol. Sometimes he will feel this way when his sugar level is low. He has been eating well and not missing meals. Denies syncope or falls due to dizziness. He needs to get up slowly especially in the morning. Tries to stay well hydrated throughout the day. Does not go out much in the heat.  His neck has also been hurting for the last 3-4 days or so and has been stiff. He thought that he slept on it wrong as he woke up with it hurting one day. Denies injury or overuse. Is taking tylenol for the pain with good success. Overall it is improving. Denies numbness or weakness in her arms. Pain does not go into chest or into jaw. ROM is now back to almost normal today.  Review of Systems  Constitutional: Positive for activity change. Negative for appetite change, fatigue, fever and unexpected weight change.  HENT: Negative.   Eyes: Negative.   Respiratory: Negative for cough, chest tightness and shortness of breath.   Cardiovascular: Negative for chest pain, palpitations and leg swelling.  Gastrointestinal: Negative for abdominal distention, abdominal pain, constipation, diarrhea, nausea and vomiting.  Musculoskeletal: Positive for neck pain and neck stiffness.  Skin: Negative.   Neurological: Positive for dizziness and light-headedness. Negative for tremors, seizures, syncope, facial asymmetry, weakness and numbness.  Psychiatric/Behavioral: Negative.       Objective:   Physical Exam  Constitutional: He is oriented to person, place, and time. He appears well-developed and well-nourished.  HENT:  Head: Normocephalic and atraumatic.  Eyes: EOM are normal.  Neck: Normal range of motion.  Cardiovascular: Normal rate and regular  rhythm.  Pulmonary/Chest: Effort normal and breath sounds normal. No respiratory distress. He has no wheezes. He has no rales.  Abdominal: Soft. Bowel sounds are normal. He exhibits no distension. There is no tenderness. There is no rebound.  Musculoskeletal: He exhibits tenderness. He exhibits no edema.  Some muscular pain in the neck laterally left and right  Neurological: He is alert and oriented to person, place, and time. Coordination normal.  Some unsteadiness with standing today, no true orthostatic symptoms in the office  Skin: Skin is warm and dry.  Psychiatric: He has a normal mood and affect.   Vitals:   11/21/17 0938  BP: (!) 158/80  Pulse: 85  Temp: 98.4 F (36.9 C)  TempSrc: Oral  SpO2: 97%  Weight: 249 lb (112.9 kg)  Height: 5\' 11"  (1.803 m)      Assessment & Plan:

## 2017-11-21 NOTE — Patient Instructions (Addendum)
We will have you start taking atenolol in the evening in stead of the morning to see if this helps with your dizziness.  Iron-Rich Diet Iron is a mineral that helps your body to produce hemoglobin. Hemoglobin is a protein in your red blood cells that carries oxygen to your body's tissues. Eating too little iron may cause you to feel weak and tired, and it can increase your risk for infection. Eating enough iron is necessary for your body's metabolism, muscle function, and nervous system. Iron is naturally found in many foods. It can also be added to foods or fortified in foods. There are two types of dietary iron:  Heme iron. Heme iron is absorbed by the body more easily than nonheme iron. Heme iron is found in meat, poultry, and fish.  Nonheme iron. Nonheme iron is found in dietary supplements, iron-fortified grains, beans, and vegetables.  You may need to follow an iron-rich diet if:  You have been diagnosed with iron deficiency or iron-deficiency anemia.  You have a condition that prevents you from absorbing dietary iron, such as: ? Infection in your intestines. ? Celiac disease. This involves long-lasting (chronic) inflammation of your intestines.  You do not eat enough iron.  You eat a diet that is high in foods that impair iron absorption.  You have lost a lot of blood.  You have heavy bleeding during your menstrual cycle.  You are pregnant.  What is my plan? Your health care provider may help you to determine how much iron you need per day based on your condition. Generally, when a person consumes sufficient amounts of iron in the diet, the following iron needs are met:  Men. ? 62-69 years old: 11 mg per day. ? 40-63 years old: 8 mg per day.  Women. ? 51-1 years old: 15 mg per day. ? 10-66 years old: 18 mg per day. ? Over 36 years old: 8 mg per day. ? Pregnant women: 27 mg per day. ? Breastfeeding women: 9 mg per day.  What do I need to know about an iron-rich  diet?  Eat fresh fruits and vegetables that are high in vitamin C along with foods that are high in iron. This will help increase the amount of iron that your body absorbs from food, especially with foods containing nonheme iron. Foods that are high in vitamin C include oranges, peppers, tomatoes, and mango.  Take iron supplements only as directed by your health care provider. Overdose of iron can be life-threatening. If you were prescribed iron supplements, take them with orange juice or a vitamin C supplement.  Cook foods in pots and pans that are made from iron.  Eat nonheme iron-containing foods alongside foods that are high in heme iron. This helps to improve your iron absorption.  Certain foods and drinks contain compounds that impair iron absorption. Avoid eating these foods in the same meal as iron-rich foods or with iron supplements. These include: ? Coffee, black tea, and red wine. ? Milk, dairy products, and foods that are high in calcium. ? Beans, soybeans, and peas. ? Whole grains.  When eating foods that contain both nonheme iron and compounds that impair iron absorption, follow these tips to absorb iron better. ? Soak beans overnight before cooking. ? Soak whole grains overnight and drain them before using. ? Ferment flours before baking, such as using yeast in bread dough. What foods can I eat? Grains Iron-fortified breakfast cereal. Iron-fortified whole-wheat bread. Enriched rice. Sprouted grains. Vegetables Spinach. Potatoes  with skin. Green peas. Broccoli. Red and green bell peppers. Fermented vegetables. Fruits Prunes. Raisins. Oranges. Strawberries. Mango. Grapefruit. Meats and Other Protein Sources Beef liver. Oysters. Beef. Shrimp. [place]. Chicken. [place]. Sardines. Chickpeas. Nuts. Tofu. Beverages Tomato juice. Fresh orange juice. Prune juice. Hibiscus tea. Fortified instant breakfast shakes. Condiments Tahini. Fermented soy sauce. Sweets and  Desserts Black-strap molasses. Other Wheat germ. The items listed above may not be a complete list of recommended foods or beverages. Contact your dietitian for more options. What foods are not recommended? Grains Whole grains. Bran cereal. Bran flour. Oats. Vegetables Artichokes. Brussels sprouts. Kale. Fruits Blueberries. Raspberries. Strawberries. Figs. Meats and Other Protein Sources Soybeans. Products made from soy protein. Dairy Milk. Cream. Cheese. Yogurt. Cottage cheese. Beverages Coffee. Black tea. Red wine. Sweets and Desserts Cocoa. Chocolate. Ice cream. Other Basil. Oregano. Parsley. The items listed above may not be a complete list of foods and beverages to avoid. Contact your dietitian for more information. This information is not intended to replace advice given to you by your health care provider. Make sure you discuss any questions you have with your health care provider. Document Released: 12/14/2004 Document Revised: 11/20/2015 Document Reviewed: 11/27/2013 Elsevier Interactive Patient Education  [employer].

## 2017-11-24 DIAGNOSIS — R42 Dizziness and giddiness: Secondary | ICD-10-CM | POA: Insufficient documentation

## 2017-11-24 NOTE — Assessment & Plan Note (Signed)
We will move atenolol to the evening given symptoms to see if he tolerates this better. Continue amlodipine 2.5 mg daily and atenolol 25 mg daily. BP marginal today so would not stop an agent.

## 2017-11-24 NOTE — Assessment & Plan Note (Signed)
Could be related to low HR from atenolol or orthostatic hypotension from late breakfast. We have asked him to move atenolol to the evening from morning to help with his symptoms. No labs today as he brought copies of recent (within 2 weeks) labs from the [place] which were reviewed with him and scanned into record at today's visit.

## 2017-11-24 NOTE — Assessment & Plan Note (Signed)
Taking tylenol for pain and will continue. If symptoms worsen, new numbness or weakness develops then he will need re-evaluation and likely imaging. Will defer as not indicated today.

## 2017-11-30 ENCOUNTER — Other Ambulatory Visit: Payer: Self-pay | Admitting: Internal Medicine

## 2017-12-11 ENCOUNTER — Ambulatory Visit (AMBULATORY_SURGERY_CENTER): Payer: Self-pay

## 2017-12-11 VITALS — Ht 71.0 in | Wt 247.2 lb

## 2017-12-11 DIAGNOSIS — Z86010 Personal history of colonic polyps: Secondary | ICD-10-CM

## 2017-12-11 MED ORDER — NA SULFATE-K SULFATE-MG SULF 17.5-3.13-1.6 GM/177ML PO SOLN: 1.0000 | Freq: Once | ORAL | 0 refills | Status: AC

## 2017-12-11 NOTE — Progress Notes (Signed)
Denies allergies to eggs or soy products. Denies complication of anesthesia or sedation. Denies use of weight loss medication. Denies use of O2.   Emmi instructions declined.    A sample of Suprep was given to the patient. Patient states that his car is broken and he doesn't have the money to get it fixed and he doesn't have a way to go to the [place] to pick up Movi- Prep.

## 2017-12-22 ENCOUNTER — Ambulatory Visit (AMBULATORY_SURGERY_CENTER): Payer: Medicare Other | Admitting: Gastroenterology

## 2017-12-22 ENCOUNTER — Encounter: Payer: Self-pay | Admitting: Gastroenterology

## 2017-12-22 ENCOUNTER — Other Ambulatory Visit: Payer: Self-pay | Admitting: Gastroenterology

## 2017-12-22 ENCOUNTER — Other Ambulatory Visit (INDEPENDENT_AMBULATORY_CARE_PROVIDER_SITE_OTHER): Payer: Medicare Other

## 2017-12-22 VITALS — BP 111/78 | HR 54 | Temp 97.1°F | Resp 11 | Ht 71.0 in | Wt 247.0 lb

## 2017-12-22 DIAGNOSIS — E119 Type 2 diabetes mellitus without complications: Secondary | ICD-10-CM | POA: Diagnosis not present

## 2017-12-22 DIAGNOSIS — Z1211 Encounter for screening for malignant neoplasm of colon: Secondary | ICD-10-CM | POA: Diagnosis not present

## 2017-12-22 DIAGNOSIS — D122 Benign neoplasm of ascending colon: Secondary | ICD-10-CM | POA: Diagnosis not present

## 2017-12-22 DIAGNOSIS — D120 Benign neoplasm of cecum: Secondary | ICD-10-CM | POA: Diagnosis not present

## 2017-12-22 DIAGNOSIS — I10 Essential (primary) hypertension: Secondary | ICD-10-CM | POA: Diagnosis not present

## 2017-12-22 DIAGNOSIS — Z86010 Personal history of colonic polyps: Secondary | ICD-10-CM | POA: Diagnosis not present

## 2017-12-22 DIAGNOSIS — D125 Benign neoplasm of sigmoid colon: Secondary | ICD-10-CM | POA: Diagnosis not present

## 2017-12-22 DIAGNOSIS — D123 Benign neoplasm of transverse colon: Secondary | ICD-10-CM | POA: Diagnosis not present

## 2017-12-22 DIAGNOSIS — D508 Other iron deficiency anemias: Secondary | ICD-10-CM

## 2017-12-22 LAB — CBC WITH DIFFERENTIAL/PLATELET
BASOS ABS: 0 10*3/uL (ref 0.0–0.1)
Basophils Relative: 0.3 % (ref 0.0–3.0)
EOS ABS: 0.1 10*3/uL (ref 0.0–0.7)
Eosinophils Relative: 1.6 % (ref 0.0–5.0)
HCT: 32.4 % — ABNORMAL LOW (ref 39.0–52.0)
HEMOGLOBIN: 10.3 g/dL — AB (ref 13.0–17.0)
LYMPHS PCT: 24.7 % (ref 12.0–46.0)
Lymphs Abs: 1.8 10*3/uL (ref 0.7–4.0)
MCHC: 31.8 g/dL (ref 30.0–36.0)
MCV: 86.3 fl (ref 78.0–100.0)
MONOS PCT: 6.1 % (ref 3.0–12.0)
Monocytes Absolute: 0.4 10*3/uL (ref 0.1–1.0)
Neutro Abs: 4.9 10*3/uL (ref 1.4–7.7)
Neutrophils Relative %: 67.3 % (ref 43.0–77.0)
Platelets: 242 10*3/uL (ref 150.0–400.0)
RBC: 3.76 Mil/uL — ABNORMAL LOW (ref 4.22–5.81)
RDW: 16.6 % — ABNORMAL HIGH (ref 11.5–15.5)
WBC: 7.2 10*3/uL (ref 4.0–10.5)

## 2017-12-22 MED ORDER — SODIUM CHLORIDE 0.9 % IV SOLN: 500.0000 mL | Freq: Once | INTRAVENOUS | Status: DC

## 2017-12-22 NOTE — Op Note (Signed)
[place] Patient Name: [name] [name] Procedure Date: 12/22/2017 9:45 AM MRN: [id number] Endoscopist: Remo Lipps P. Havery Moros , MD Age: 84 Referring MD:  Date of Birth: 17-Mar-1934 Gender: Male Account #: [id number] Procedure:                Colonoscopy Indications:              Surveillance: Personal history of adenomatous                            polyps on last colonoscopy > 5 years ago, also with                            reported history of iron deficiency / rectal                            bleeding Medicines:                Monitored Anesthesia Care Procedure:                Pre-Anesthesia Assessment:                           - Prior to the procedure, a History and Physical                            was performed, and patient medications and                            allergies were reviewed. The patient's tolerance of                            previous anesthesia was also reviewed. The risks                            and benefits of the procedure and the sedation                            options and risks were discussed with the patient.                            All questions were answered, and informed consent                            was obtained. Prior Anticoagulants: The patient has                            taken no previous anticoagulant or antiplatelet                            agents. ASA Grade Assessment: III - A patient with                            severe systemic disease. After reviewing the risks  and benefits, the patient was deemed in                            satisfactory condition to undergo the procedure.                           After obtaining informed consent, the colonoscope                            was passed under direct vision. Throughout the                            procedure, the patient's blood pressure, pulse, and                            oxygen saturations were monitored continuously.  The                            Colonoscope was introduced through the anus and                            advanced to the the terminal ileum, with                            identification of the appendiceal orifice and IC                            valve. The colonoscopy was performed without                            difficulty. The patient tolerated the procedure                            well. The quality of the bowel preparation was                            adequate. The terminal ileum, ileocecal valve,                            appendiceal orifice, and rectum were photographed. Scope In: 9:50:24 AM Scope Out: 10:07:50 AM Scope Withdrawal Time: 0 hours 14 minutes 16 seconds  Total Procedure Duration: 0 hours 17 minutes 26 seconds  Findings:                 The perianal and digital rectal examinations were                            normal.                           The terminal ileum appeared normal.                           Multiple medium-mouthed diverticula were found in  the left colon.                           A 5 mm polyp was found in the cecum. The polyp was                            sessile. The polyp was removed with a cold snare.                            Resection and retrieval were complete.                           Two sessile polyps were found in the ascending                            colon. The polyps were 4 to 10 mm in size. These                            polyps were removed with a cold snare. Resection                            and retrieval were complete.                           Two sessile polyps were found in the transverse                            colon. The polyps were 4 to 5 mm in size. These                            polyps were removed with a cold snare. Resection                            and retrieval were complete.                           Two sessile polyps were found in the sigmoid colon.                             The polyps were 3 to 8 mm in size. These polyps                            were removed with a cold snare. Resection and                            retrieval were complete.                           Internal hemorrhoids were found during retroflexion.                           The exam was otherwise without abnormality. Complications:  No immediate complications. Estimated blood loss:                            Minimal. Estimated Blood Loss:     Estimated blood loss was minimal. Impression:               - The examined portion of the ileum was normal.                           - Diverticulosis in the left colon.                           - One 5 mm polyp in the cecum, removed with a cold                            snare. Resected and retrieved.                           - Two 4 to 10 mm polyps in the ascending colon,                            removed with a cold snare. Resected and retrieved.                           - Two 4 to 5 mm polyps in the transverse colon,                            removed with a cold snare. Resected and retrieved.                           - Two 3 to 8 mm polyps in the sigmoid colon,                            removed with a cold snare. Resected and retrieved.                           - Internal hemorrhoids.                           - The examination was otherwise normal.                           No cause for iron deficiency anemia on this exam.                            Hemorrhoids are the likely cause of bleeding                            symptoms. Recommendation:           - Patient has a contact number available for                            emergencies. The signs and symptoms of potential  delayed complications were discussed with the                            patient. Return to normal activities tomorrow.                            Written discharge instructions were provided to the                             patient.                           - Resume previous diet.                           - Continue present medications.                           - Await pathology results.                           - Repeat CBC to trend anemia, consider EGD to                            further evaluate                           - Likely no further surveillance colonoscopy is                            needed due to age Carlota Raspberry. Armbruster, MD 12/22/2017 10:15:18 AM This report has been signed electronically.

## 2017-12-22 NOTE — Patient Instructions (Signed)
YOU HAD AN ENDOSCOPIC PROCEDURE TODAY AT [facility]:   Refer to the procedure report that was given to you for any specific questions about what was found during the examination.  If the procedure report does not answer your questions, please call your gastroenterologist to clarify.  If you requested that your care partner not be given the details of your procedure findings, then the procedure report has been included in a sealed envelope for you to review at your convenience later.  YOU SHOULD EXPECT: Some feelings of bloating in the abdomen. Passage of more gas than usual.  Walking can help get rid of the air that was put into your GI tract during the procedure and reduce the bloating. If you had a lower endoscopy (such as a colonoscopy or flexible sigmoidoscopy) you may notice spotting of blood in your stool or on the toilet paper. If you underwent a bowel prep for your procedure, you may not have a normal bowel movement for a few days.  Please Note:  You might notice some irritation and congestion in your nose or some drainage.  This is from the oxygen used during your procedure.  There is no need for concern and it should clear up in a day or so.  SYMPTOMS TO REPORT IMMEDIATELY:   Following lower endoscopy (colonoscopy or flexible sigmoidoscopy):  Excessive amounts of blood in the stool  Significant tenderness or worsening of abdominal pains  Swelling of the abdomen that is new, acute  Fever of 100F or higher    For urgent or emergent issues, a gastroenterologist can be reached at any hour by calling [phone].   DIET:  We do recommend a small meal at first, but then you may proceed to your regular diet.  Drink plenty of fluids but you should avoid alcoholic beverages for 24 hours.  ACTIVITY:  You should plan to take it easy for the rest of today and you should NOT DRIVE or use heavy machinery until tomorrow (because of the sedation medicines used during the test).     FOLLOW UP: Our staff will call the number listed on your records the next business day following your procedure to check on you and address any questions or concerns that you may have regarding the information given to you following your procedure. If we do not reach you, we will leave a message.  However, if you are feeling well and you are not experiencing any problems, there is no need to return our call.  We will assume that you have returned to your regular daily activities without incident.  If any biopsies were taken you will be contacted by phone or by letter within the next 1-3 weeks.  Please call us at [phone] if you have not heard about the biopsies in 3 weeks.    SIGNATURES/CONFIDENTIALITY: You and/or your care partner have signed paperwork which will be entered into your electronic medical record.  These signatures attest to the fact that that the information above on your After Visit Summary has been reviewed and is understood.  Full responsibility of the confidentiality of this discharge information lies with you and/or your care-partner.  Resume medications. Information given on polyps and hemorrhoids. Upon discharge from unit go to lab for CBC.

## 2017-12-22 NOTE — Progress Notes (Signed)
Called to room to assist during endoscopic procedure.  Patient ID and intended procedure confirmed with present staff. Received instructions for my participation in the procedure from the performing physician.  

## 2017-12-22 NOTE — Progress Notes (Signed)
Pt's states no medical or surgical changes since previsit or office visit. 

## 2017-12-22 NOTE — Progress Notes (Signed)
Report given to PACU, vss 

## 2017-12-25 ENCOUNTER — Telehealth: Payer: Self-pay | Admitting: *Deleted

## 2017-12-25 ENCOUNTER — Other Ambulatory Visit: Payer: Self-pay

## 2017-12-25 DIAGNOSIS — D508 Other iron deficiency anemias: Secondary | ICD-10-CM

## 2017-12-25 NOTE — Telephone Encounter (Signed)
  Follow up Call-  Call back number 12/22/2017  Post procedure Call Back phone  # [phone]  Permission to leave phone message Yes  Some recent data might be hidden     Patient questions:  Do you have a fever, pain , or abdominal swelling? No. Pain Score  0 *  Have you tolerated food without any problems? Yes.    Have you been able to return to your normal activities? Yes.    Do you have any questions about your discharge instructions: Diet   No. Medications  No. Follow up visit  No.  Do you have questions or concerns about your Care? No.  Actions: * If pain score is 4 or above: No action needed, pain <4.

## 2017-12-27 ENCOUNTER — Encounter: Payer: Self-pay | Admitting: Gastroenterology

## 2018-01-03 ENCOUNTER — Telehealth: Payer: Self-pay | Admitting: Gastroenterology

## 2018-01-03 NOTE — Telephone Encounter (Signed)
Pt had colon on 12/22/17 with Dr. Havery Moros. He stated that he told pt that he needs an upper GI. Pls advise if it is ok to schedule pt for procedure.

## 2018-01-03 NOTE — Telephone Encounter (Signed)
Ok to schedule EGD at [facility], will also need a pre-visit appointment. Diagnosis is: anemia. If you could set this up that would be great, thank you.

## 2018-01-04 ENCOUNTER — Encounter: Payer: Self-pay | Admitting: Gastroenterology

## 2018-01-04 NOTE — Telephone Encounter (Signed)
Egd scheduled on 02/15/18 at 10:00am.

## 2018-01-17 DIAGNOSIS — N5201 Erectile dysfunction due to arterial insufficiency: Secondary | ICD-10-CM | POA: Diagnosis not present

## 2018-01-17 DIAGNOSIS — Z8551 Personal history of malignant neoplasm of bladder: Secondary | ICD-10-CM | POA: Diagnosis not present

## 2018-02-01 ENCOUNTER — Ambulatory Visit (AMBULATORY_SURGERY_CENTER): Payer: Self-pay

## 2018-02-01 VITALS — Ht 71.0 in | Wt 248.0 lb

## 2018-02-01 DIAGNOSIS — D508 Other iron deficiency anemias: Secondary | ICD-10-CM

## 2018-02-01 NOTE — Progress Notes (Signed)
Denies allergies to eggs or soy products. Denies complication of anesthesia or sedation. Denies use of weight loss medication. Denies use of O2.   Emmi instructions declined.  

## 2018-02-02 ENCOUNTER — Other Ambulatory Visit: Payer: Medicare Other

## 2018-02-02 ENCOUNTER — Ambulatory Visit (INDEPENDENT_AMBULATORY_CARE_PROVIDER_SITE_OTHER): Payer: Medicare Other | Admitting: Family

## 2018-02-02 ENCOUNTER — Ambulatory Visit (INDEPENDENT_AMBULATORY_CARE_PROVIDER_SITE_OTHER)
Admission: RE | Admit: 2018-02-02 | Discharge: 2018-02-02 | Disposition: A | Discharge status: Home / Self Care | Payer: Medicare Other | Source: Ambulatory Visit | Attending: Family | Admitting: Family

## 2018-02-02 ENCOUNTER — Other Ambulatory Visit: Payer: Self-pay | Admitting: Family

## 2018-02-02 ENCOUNTER — Encounter: Payer: Self-pay | Admitting: Family

## 2018-02-02 ENCOUNTER — Other Ambulatory Visit (INDEPENDENT_AMBULATORY_CARE_PROVIDER_SITE_OTHER): Payer: Medicare Other

## 2018-02-02 ENCOUNTER — Telehealth: Payer: Self-pay | Admitting: Gastroenterology

## 2018-02-02 DIAGNOSIS — R1084 Generalized abdominal pain: Secondary | ICD-10-CM

## 2018-02-02 DIAGNOSIS — K5730 Diverticulosis of large intestine without perforation or abscess without bleeding: Secondary | ICD-10-CM | POA: Diagnosis not present

## 2018-02-02 DIAGNOSIS — N281 Cyst of kidney, acquired: Secondary | ICD-10-CM | POA: Diagnosis not present

## 2018-02-02 LAB — CBC WITH DIFFERENTIAL/PLATELET
BASOS PCT: 0.3 % (ref 0.0–3.0)
Basophils Absolute: 0 10*3/uL (ref 0.0–0.1)
EOS PCT: 0.5 % (ref 0.0–5.0)
Eosinophils Absolute: 0 10*3/uL (ref 0.0–0.7)
HEMATOCRIT: 32.8 % — AB (ref 39.0–52.0)
HEMOGLOBIN: 10.6 g/dL — AB (ref 13.0–17.0)
LYMPHS PCT: 24.2 % (ref 12.0–46.0)
Lymphs Abs: 2.3 10*3/uL (ref 0.7–4.0)
MCHC: 32.3 g/dL (ref 30.0–36.0)
MCV: 83.8 fl (ref 78.0–100.0)
Monocytes Absolute: 0.9 10*3/uL (ref 0.1–1.0)
Monocytes Relative: 9.8 % (ref 3.0–12.0)
Neutro Abs: 6.2 10*3/uL (ref 1.4–7.7)
Neutrophils Relative %: 65.2 % (ref 43.0–77.0)
Platelets: 249 10*3/uL (ref 150.0–400.0)
RBC: 3.91 Mil/uL — ABNORMAL LOW (ref 4.22–5.81)
RDW: 16.8 % — AB (ref 11.5–15.5)
WBC: 9.5 10*3/uL (ref 4.0–10.5)

## 2018-02-02 LAB — BASIC METABOLIC PANEL
BUN: 14 mg/dL (ref 6–23)
CALCIUM: 10.2 mg/dL (ref 8.4–10.5)
CHLORIDE: 103 meq/L (ref 96–112)
CO2: 31 meq/L (ref 19–32)
Creatinine, Ser: 1.21 mg/dL (ref 0.40–1.50)
GFR: 73.73 mL/min (ref >60.00)
GLUCOSE: 136 mg/dL — AB (ref 70–99)
POTASSIUM: 4.3 meq/L (ref 3.5–5.1)
Sodium: 140 mEq/L (ref 135–145)

## 2018-02-02 MED ORDER — IOPAMIDOL (ISOVUE-300) INJECTION 61%
100.0000 mL | Freq: Once | INTRAVENOUS | Status: AC | PRN
  Administered 2018-02-02: 100 mL via INTRAVENOUS

## 2018-02-02 MED ORDER — SULFAMETHOXAZOLE-TRIMETHOPRIM 800-160 MG PO TABS: 1.0000 | ORAL_TABLET | Freq: Two times a day (BID) | ORAL | 0 refills | Status: DC

## 2018-02-02 NOTE — Telephone Encounter (Signed)
Thanks Almyra Free, agree, if fevers persist he needs to be seen and should seek evaluation.

## 2018-02-02 NOTE — Telephone Encounter (Signed)
Thanks Gabe for taking his call and the recommendations.  [name] [name] can you touch base with this patient to see how he is feeling. If we have any openings with APP we can try to see him today (I am not in clinic however), or he can see his PCP or urgent care. If he continues to have severe abdominal pain and fever he otherwise may want to go to the ED where he can get imaging of his abdomen done quickly.

## 2018-02-02 NOTE — Progress Notes (Signed)
[name] [name] is a 84 y.o. male with the following history as recorded in EpicCare:  Patient Active Problem List   Diagnosis Date Noted  . Dizziness 11/24/2017  . Allergic rhinitis 03/04/2015  . Peripheral edema 03/04/2015  . Neck pain 02/18/2015  . Routine general medical examination at a health care facility 09/26/2014  . HYPERLIPIDEMIA 03/23/2010  . OBESITY, CLASS III 08/30/2008  . Osteoarthrosis, unspecified whether generalized or localized, involving lower leg 08/29/2008  . Malignant neoplasm of bladder ([place]) 01/23/2007  . Type 1 diabetes mellitus ([place]) 01/23/2007  . Essential hypertension 01/23/2007  . GERD 01/23/2007    Current Outpatient Medications  Medication Sig Dispense Refill  . acetaminophen (TYLENOL) 325 MG tablet Take 325-650 mg by mouth as needed for fever or headache.     Marland Kitchen amLODipine (NORVASC) 2.5 MG tablet Take 2.5 mg by mouth every morning.    Marland Kitchen aspirin 325 MG tablet Take 1 tablet (325 mg total) by mouth daily.    Marland Kitchen atenolol (TENORMIN) 25 MG tablet Take 25 mg by mouth every morning.     . insulin glargine (LANTUS) 100 UNIT/ML injection Inject 45 Units into the skin at bedtime.     . insulin regular (NOVOLIN R) 100 units/mL injection Inject 5 Units into the skin 3 (three) times daily before meals. Sliding scale as needed    . ketotifen (ZADITOR) 0.025 % ophthalmic solution Place 1 drop into both eyes 2 (two) times daily as needed (irritation).    Marland Kitchen losartan (COZAAR) 50 MG tablet Take 50 mg by mouth 2 (two) times daily.      . magnesium hydroxide (MILK OF MAGNESIA) 400 MG/5ML suspension Take 30 mLs by mouth daily as needed for moderate constipation.     . metFORMIN (GLUCOPHAGE) 1000 MG tablet Take 1,000 mg by mouth 2 (two) times daily with a meal.      . montelukast (SINGULAIR) 10 MG tablet Take 1 tablet (10 mg total) by mouth at bedtime. 30 tablet 3  . omeprazole (PRILOSEC) 20 MG capsule Take 40 mg by mouth 2 (two) times daily before a meal.     . polyvinyl  alcohol (LIQUIFILM TEARS) 1.4 % ophthalmic solution Place 1 drop into both eyes 3 (three) times daily.     . ranitidine (ZANTAC) 150 MG tablet Take 1 tablet (150 mg total) by mouth at bedtime. 90 tablet 1  . simvastatin (ZOCOR) 40 MG tablet Take 20 mg by mouth every evening.    . triamcinolone (NASACORT AQ) 55 MCG/ACT AERO nasal inhaler Place 2 sprays into the nose daily. 1 Inhaler 12  . vitamin B-12 (CYANOCOBALAMIN) 1000 MCG tablet Take 1,000 mcg by mouth daily.     Current Facility-Administered Medications  Medication Dose Route Frequency Provider Last Rate Last Dose  . 0.9 %  sodium chloride infusion  500 mL Intravenous Once Armbruster, Carlota Raspberry, MD        Allergies: Lisinopril  Past Medical History:  Diagnosis Date  . Adenoma of left adrenal gland    STABLE PER UROLOGIST NOTE DR ESKRIDGE  . Anemia   . Anxiety   . Bilateral dry eyes   . Blood transfusion without reported diagnosis   . Cataract   . Generalized OA   . GERD (gastroesophageal reflux disease)   . History of bladder cancer    S/P RADICAL CYSTECTOMY W/ IDEAL LOOP URINARY DIVERSION  2000  . Hyperlipidemia   . Hypertension   . S/P ileostomy ([place])    URINARY ILEAL  .  Type 2 diabetes mellitus ([place])   . Urothelial cancer ([place])   . 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 CATARACT EXTRACTION Right   . CYSTOSCOPY WITH BIOPSY N/A 01/22/2013   Procedure: CYSTOSCOPY WITH BIOPSY;  Surgeon: Fredricka Bonine, MD;  Location: [facility];  Service: Urology;  Laterality: N/A;  Urethra Biopsy  . CYSTOSCOPY WITH BIOPSY N/A 04/16/2013   Procedure: CYSTOSCOPY WITH BIOPSY OF URETHRA/FULGURATION;  Surgeon: Fredricka Bonine, MD;  Location: [facility];  Service: Urology;  Laterality: N/A;  . RADICAL CYSTECTOMY AND ILEAL LOOP DIVERSION  2000   BLADDER CANCER    Family History  Problem Relation Age of Onset  . Diabetes Mother   .  Colon cancer Neg Hx   . Esophageal cancer Neg Hx   . Rectal cancer Neg Hx   . Stomach cancer Neg Hx     Social History   Tobacco Use  . Smoking status: Former Smoker    Packs/day: 1.50    Years: 20.00    Pack years: 30.00    Types: Cigarettes    Last attempt to quit: 01/19/1976    Years since quitting: 42.0  . Smokeless tobacco: Never Used  Substance Use Topics  . Alcohol use: Yes    Comment: OCCASIONAL    Subjective:  Started yesterday with sudden onset of sensation of abdominal pain/ bloating; no history of bowel obstruction- 2 abdominal surgeries in the past 15 years for bladder cancer; tried taking Milk of Magnesia yesterday with limited relief yesterday; did have UTI in August- [facility]; ran a fever of 101.4 last night; notes that abdomen still feels full/ swollen today;  Patient notes he has been monitoring his urine output and appears normal color/ appropriate amount; unfortunately, no way to obtain urine sample here today as patient wears bag;   Objective:  Vitals:   02/02/18 1127  BP: (!) 158/70  Pulse: 62  Temp: 99 F (37.2 C)  TempSrc: Oral  SpO2: 96%  Weight: 243 lb (110.2 kg)  Height: _0  (1.803 m)    General: Well developed, well nourished, in no acute distress  Skin : Warm and dry.  Head: Normocephalic and atraumatic  Lungs: Respirations unlabored; clear to auscultation bilaterally without wheeze, rales, rhonchi  CVS exam: normal rate and regular rhythm.  Abdomen: Soft; nontender; nondistended; decreased breath sounds Musculoskeletal: No deformities; no active joint inflammation  Extremities: No edema, cyanosis, clubbing  Vessels: Symmetric bilaterally  Neurologic: Alert and oriented; speech intact; face symmetrical; moves all extremities well; CNII-XII intact without focal deficit  Assessment:  1. Generalized abdominal pain     Plan:  ? Bowel obstruction vs possible abscess due to sudden high fever/ pain; update CBC, BMP; STAT  abd/ pelvic CT ordered for this afternoon; follow-up to be determined.    No follow-ups on file.  Orders Placed This Encounter  Procedures  . CT Abdomen Pelvis W Contrast    SS> [place] / Diabetic on met getting stat labs     Standing Status:   Future    Standing Expiration Date:   05/05/2019    Order Specific Question:   If indicated for the ordered procedure, I authorize the administration of contrast media per Radiology protocol    Answer:   Yes    Order Specific Question:   Preferred imaging location?    Answer:  [address]    Order Specific Question:   Is Oral Contrast requested for this exam?    Answer:   Yes, Per Radiology protocol    Order Specific Question:   Radiology Contrast Protocol - do NOT remove file path    Answer:   [email or web address]  . CBC w/Diff    Standing Status:   Future    Number of Occurrences:   1    Standing Expiration Date:   02/02/2019  . Basic Metabolic Panel (BMET)    Standing Status:   Future    Number of Occurrences:   1    Standing Expiration Date:   02/02/2019    Requested Prescriptions    No prescriptions requested or ordered in this encounter

## 2018-02-02 NOTE — Telephone Encounter (Signed)
Patient called the emergency line at [phone] this morning.  He described having significant abdominal pain in his mid abdomen as well as new onset constipation.  He took milk of magnesia yesterday and had a bowel movement that was small and hard.  He continued to have issues with constipation into this morning and is taking his temperature and is 100.8.  He does not feel dizzy or lightheaded but he does not feel well.  His pain is significant and he feels that this is the worst discomfort he has had in his life.  He is not having any blood in his stools and he has not vomited up any blood.  He feels nauseated.  I reviewed his colonoscopy from the last few months with multiple cold snare polypectomies and plan for an upper endoscopy for evaluation of anemia.  I discussed with the patient that is not clear to me that his constipation is the etiology of his fever.  He is having a headache as well as some potential upper respiratory symptoms.  He has not discussed this with his primary care doctor or primary care doctor coverage overnight.  I suggested that the patient for his symptoms of constipation could consider another dose of milk of magnesia and/or initiate MiraLAX but over the morning hours it is not clear how much of that would be effective.  I did suggest that if he feels this is the worst pain in his life and with a fever that he needs to get further evaluation and although it is not ideal to go to the emergency department may be the best thing if he feels this incredibly unwell.  I will relay this information to his primary care physician and to his primary gastroenterologist.  He is not clear what he wants to do in regards to going to the ED awaiting to the morning and I suggested that he get evaluation as soon as he feels comfortable but that again is not clear that constipation (he had a bowel movement yesterday) is a cause of his fever at this point in time.  All questions were answered to the best of my  ability and the patient will decide whether he wants to be evaluated overnight or wait to the morning to have a further evaluation.  I strongly urged him to head to the urgent care or or emergency department if he is this unwell per his report.  Justice Britain, MD [facility] Office # [phone]

## 2018-02-02 NOTE — Progress Notes (Signed)
Spoke with patient regarding his CT results; will start antibiotics to cover for possible pyelonephritis; CBC and CMP reassuring; ? Renal cell carcinoma- he understands he needs to see his urologist in follow-up. I will forward results to Dr. Esperanza Richters as well.

## 2018-02-02 NOTE — Telephone Encounter (Signed)
Spoke to patient, he still has a fever of 101 degrees F, just took 2 Tylenol 5 minutes ago but has been taking this medication for fever and pain. He did have a small bowel movement this morning, abdominal pain is a little better. Advised that he will need to be seen by someone today, we do not have any available appointments until next Thursday. Patient does not want to go to ED, advised this may be the best option if imagining is needed, but he could go to UC or contact his PCP. Patient will contact PCP about being seen today.

## 2018-02-07 ENCOUNTER — Encounter: Payer: Self-pay | Admitting: Podiatry

## 2018-02-07 ENCOUNTER — Ambulatory Visit (INDEPENDENT_AMBULATORY_CARE_PROVIDER_SITE_OTHER): Payer: Medicare Other | Admitting: Podiatry

## 2018-02-07 DIAGNOSIS — M79676 Pain in unspecified toe(s): Secondary | ICD-10-CM

## 2018-02-07 DIAGNOSIS — B351 Tinea unguium: Secondary | ICD-10-CM | POA: Diagnosis not present

## 2018-02-07 DIAGNOSIS — E119 Type 2 diabetes mellitus without complications: Secondary | ICD-10-CM

## 2018-02-07 NOTE — Progress Notes (Addendum)
Complaint:  Visit Type: Patient returns to my office for continued preventative foot care services. Complaint: Patient states" my nails have grown long and thick and become painful to walk and wear shoes" Patient has been diagnosed with DM with no foot complications. The patient presents for preventative foot care services. No changes to ROS  Podiatric Exam: Vascular: dorsalis pedis and posterior tibial pulses are palpable bilateral. Capillary return is immediate. Temperature gradient is WNL. Skin turgor WNL  Sensorium: Normal Semmes Weinstein monofilament test. Normal tactile sensation bilaterally. Nail Exam: Pt has thick disfigured discolored nails with subungual debris noted bilateral entire nail hallux through fifth toenails.  Incurvation hallux nails  B/L. Ulcer Exam: There is no evidence of ulcer or pre-ulcerative changes or infection. Orthopedic Exam: Muscle tone and strength are WNL. No limitations in general ROM. No crepitus or effusions noted. Foot type and digits show no abnormalities. Bony prominences are unremarkable. Skin: No Porokeratosis. No infection or ulcers  Diagnosis:  Onychomycosis, , Pain in right toe, pain in left toes  Treatment & Plan Procedures and Treatment: Consent by patient was obtained for treatment procedures.   Debridement of mycotic and hypertrophic toenails, 1 through 5 bilateral and clearing of subungual debris. No ulceration, no infection noted.  Return Visit-Office Procedure: Patient instructed to return to the office for a follow up visit 10 weeks  for continued evaluation and treatment.    Virdell Hoiland DPM 

## 2018-02-15 ENCOUNTER — Ambulatory Visit (AMBULATORY_SURGERY_CENTER): Payer: Medicare Other | Admitting: Gastroenterology

## 2018-02-15 ENCOUNTER — Encounter: Payer: Self-pay | Admitting: Gastroenterology

## 2018-02-15 VITALS — BP 122/76 | HR 53 | Temp 97.3°F | Resp 13 | Ht 71.0 in | Wt 243.0 lb

## 2018-02-15 DIAGNOSIS — D132 Benign neoplasm of duodenum: Secondary | ICD-10-CM | POA: Diagnosis not present

## 2018-02-15 DIAGNOSIS — I10 Essential (primary) hypertension: Secondary | ICD-10-CM | POA: Diagnosis not present

## 2018-02-15 DIAGNOSIS — K317 Polyp of stomach and duodenum: Secondary | ICD-10-CM

## 2018-02-15 DIAGNOSIS — D508 Other iron deficiency anemias: Secondary | ICD-10-CM

## 2018-02-15 DIAGNOSIS — K2950 Unspecified chronic gastritis without bleeding: Secondary | ICD-10-CM | POA: Diagnosis not present

## 2018-02-15 DIAGNOSIS — K2970 Gastritis, unspecified, without bleeding: Secondary | ICD-10-CM

## 2018-02-15 DIAGNOSIS — D649 Anemia, unspecified: Secondary | ICD-10-CM | POA: Diagnosis not present

## 2018-02-15 DIAGNOSIS — E119 Type 2 diabetes mellitus without complications: Secondary | ICD-10-CM | POA: Diagnosis not present

## 2018-02-15 MED ORDER — SODIUM CHLORIDE 0.9 % IV SOLN: 500.0000 mL | Freq: Once | INTRAVENOUS | Status: DC

## 2018-02-15 NOTE — Progress Notes (Signed)
Called to room to assist during endoscopic procedure.  Patient ID and intended procedure confirmed with present staff. Received instructions for my participation in the procedure from the performing physician.  

## 2018-02-15 NOTE — Patient Instructions (Addendum)
YOU HAD AN ENDOSCOPIC PROCEDURE TODAY AT [facility]:   Refer to the procedure report that was given to you for any specific questions about what was found during the examination.  If the procedure report does not answer your questions, please call your gastroenterologist to clarify.  If you requested that your care partner not be given the details of your procedure findings, then the procedure report has been included in a sealed envelope for you to review at your convenience later.  YOU SHOULD EXPECT: Some feelings of bloating in the abdomen. Passage of more gas than usual.  Walking can help get rid of the air that was put into your GI tract during the procedure and reduce the bloating. If you had a lower endoscopy (such as a colonoscopy or flexible sigmoidoscopy) you may notice spotting of blood in your stool or on the toilet paper. If you underwent a bowel prep for your procedure, you may not have a normal bowel movement for a few days.  Please Note:  You might notice some irritation and congestion in your nose or some drainage.  This is from the oxygen used during your procedure.  There is no need for concern and it should clear up in a day or so.  SYMPTOMS TO REPORT IMMEDIATELY:  Vomiting that has blood or coffee grounds Shortness of breath Any pain out of the ordinary Fever over 100 Pain up under the shoulder blades For urgent or emergent issues, a gastroenterologist can be reached at any hour by calling [phone].   DIET:  We do recommend a small meal at first, but then you may proceed to your regular diet.  Drink plenty of fluids but you should avoid alcoholic beverages for 24 hours.  ACTIVITY:  You should plan to take it easy for the rest of today and you should NOT DRIVE or use heavy machinery until tomorrow (because of the sedation medicines used during the test).    FOLLOW UP: Our staff will call the number listed on your records the next business day following  your procedure to check on you and address any questions or concerns that you may have regarding the information given to you following your procedure. If we do not reach you, we will leave a message.  However, if you are feeling well and you are not experiencing any problems, there is no need to return our call.  We will assume that you have returned to your regular daily activities without incident.  If any biopsies were taken you will be contacted by phone or by letter within the next 1-3 weeks.  Please call us at [phone] if you have not heard about the biopsies in 3 weeks. We will call you if the test for your stomach is positive.   SIGNATURES/CONFIDENTIALITY: You and/or your care partner have signed paperwork which will be entered into your electronic medical record.  These signatures attest to the fact that that the information above on your After Visit Summary has been reviewed and is understood.  Full responsibility of the confidentiality of this discharge information lies with you and/or your care-partner.  Read all handouts given to you by your recovery room nurse.

## 2018-02-15 NOTE — Op Note (Signed)
[facility] Patient Name: [name] [name] Procedure Date: 02/15/2018 10:31 AM MRN: [id number] Endoscopist: Remo Lipps P. Havery Moros , MD Age: 84 Referring MD:  Date of Birth: 09/18/1933 Gender: Male Account #: [id number] Procedure:                Upper GI endoscopy Indications:              Iron deficiency anemia, recent colonoscopy without                            clear etiology Medicines:                Monitored Anesthesia Care Procedure:                Pre-Anesthesia Assessment:                           - Prior to the procedure, a History and Physical                            was performed, and patient medications and                            allergies were reviewed. The patient's tolerance of                            previous anesthesia was also reviewed. The risks                            and benefits of the procedure and the sedation                            options and risks were discussed with the patient.                            All questions were answered, and informed consent                            was obtained. Prior Anticoagulants: The patient has                            taken no previous anticoagulant or antiplatelet                            agents. ASA Grade Assessment: II - A patient with                            mild systemic disease. After reviewing the risks                            and benefits, the patient was deemed in                            satisfactory condition to undergo the procedure.  After obtaining informed consent, the endoscope was                            passed under direct vision. Throughout the                            procedure, the patient's blood pressure, pulse, and                            oxygen saturations were monitored continuously. The                            Model GIF-HQ190 [id number]) scope was introduced                            through the mouth, and  advanced to the second part                            of duodenum. The upper GI endoscopy was                            accomplished without difficulty. The patient                            tolerated the procedure well. Scope In: Scope Out: Findings:                 Esophagogastric landmarks were identified: the                            Z-line was found at 41 cm, the gastroesophageal                            junction was found at 41 cm and the upper extent of                            the gastric folds was found at 41 cm from the                            incisors.                           The exam of the esophagus was otherwise normal.                           A few small sessile polyps were found in the                            gastric body. Tw of these polyps were removed with                            a cold biopsy forceps as a representative sample.  Resection and retrieval were complete.                           The exam of the stomach was otherwise normal.                           Biopsies were taken with a cold forceps in the                            gastric body, at the incisura and in the gastric                            antrum for Helicobacter pylori testing.                           A single 3 mm sessile polyp was found in the second                            portion of the duodenum. The polyp was removed with                            a cold biopsy forceps. Resection and retrieval were                            complete.                           The exam of the duodenum was otherwise normal. Complications:            No immediate complications. Estimated blood loss:                            Minimal. Estimated Blood Loss:     Estimated blood loss was minimal. Impression:               - Esophagogastric landmarks identified.                           - Normal esophagus                           - A few gastric polyps.  Resected and retrieved.                           - A single duodenal polyp. Resected and retrieved.                           - Otherwise normal stomach and duodenum                           - Biopsies were taken with a cold forceps for                            Helicobacter pylori testing. Recommendation:           - Patient has a contact  number available for                            emergencies. The signs and symptoms of potential                            delayed complications were discussed with the                            patient. Return to normal activities tomorrow.                            Written discharge instructions were provided to the                            patient.                           - Resume previous diet.                           - Continue present medications.                           - Await pathology results.                           - Consideration for capsule endoscopy if anemia                            persists without clear etiology Laurence Folz P. Nakaila Freeze, MD 02/15/2018 10:55:06 AM This report has been signed electronically.

## 2018-02-15 NOTE — Progress Notes (Signed)
Pt's states no medical or surgical changes since previsit or office visit. 

## 2018-02-15 NOTE — Progress Notes (Signed)
Report given to PACU, vss 

## 2018-02-16 ENCOUNTER — Telehealth: Payer: Self-pay

## 2018-02-16 NOTE — Telephone Encounter (Signed)
  Follow up Call-  Call back number 02/15/2018 12/22/2017  Post procedure Call Back phone  # [phone]  Permission to leave phone message Yes Yes  Some recent data might be hidden     No answer for 2nd attempt follow-up call Angela/Call back

## 2018-02-16 NOTE — Telephone Encounter (Signed)
  Follow up Call-  Call back number 02/15/2018 12/22/2017  Post procedure Call Back phone  # [phone]  Permission to leave phone message Yes Yes  Some recent data might be hidden     No ID on answering machine.  No message left. [name] [name]/Call-back [facility]

## 2018-02-22 DIAGNOSIS — N10 Acute tubulo-interstitial nephritis: Secondary | ICD-10-CM | POA: Diagnosis not present

## 2018-02-22 DIAGNOSIS — D3002 Benign neoplasm of left kidney: Secondary | ICD-10-CM | POA: Diagnosis not present

## 2018-02-23 ENCOUNTER — Other Ambulatory Visit: Payer: Self-pay | Admitting: Urology

## 2018-02-23 DIAGNOSIS — N10 Acute tubulo-interstitial nephritis: Secondary | ICD-10-CM

## 2018-02-23 DIAGNOSIS — D3002 Benign neoplasm of left kidney: Secondary | ICD-10-CM

## 2018-02-27 ENCOUNTER — Ambulatory Visit (INDEPENDENT_AMBULATORY_CARE_PROVIDER_SITE_OTHER): Payer: Medicare Other

## 2018-02-27 DIAGNOSIS — Z23 Encounter for immunization: Secondary | ICD-10-CM | POA: Diagnosis not present

## 2018-03-01 ENCOUNTER — Telehealth: Payer: Self-pay

## 2018-03-01 ENCOUNTER — Other Ambulatory Visit: Payer: Self-pay

## 2018-03-01 DIAGNOSIS — D508 Other iron deficiency anemias: Secondary | ICD-10-CM

## 2018-03-01 NOTE — Telephone Encounter (Signed)
Patient advised to stop oral iron supplement, have scheduled him at [facility] medical day for IV Feraheme x 2. Scheduled for 03/08/18 at noon, when I called patient he already has an appointment that day. I have given him the number to medical day to reschedule this appointment at his convenience. Orders placed. I let patient know that I will contact him in one month to come in for a check of labs. Reminder placed in our system.

## 2018-03-01 NOTE — Telephone Encounter (Signed)
-----   Message from Yetta Flock, MD sent at 03/01/2018 12:45 PM EDT ----- Yes that would be fine thanks  ----- Message ----- From: Wyatt Haste, RN Sent: 03/01/2018   9:54 AM EDT To: Yetta Flock, MD  Do you want him to have Feraheme x 2 infusions? Thanks, Almyra Free

## 2018-03-08 ENCOUNTER — Encounter (HOSPITAL_COMMUNITY): Payer: Medicare Other

## 2018-03-09 ENCOUNTER — Encounter (HOSPITAL_COMMUNITY)
Admission: RE | Admit: 2018-03-09 | Discharge: 2018-03-09 | Disposition: A | Discharge status: Home / Self Care | Payer: Medicare Other | Source: Ambulatory Visit | Attending: Gastroenterology | Admitting: Gastroenterology

## 2018-03-09 DIAGNOSIS — D508 Other iron deficiency anemias: Secondary | ICD-10-CM

## 2018-03-09 MED ORDER — SODIUM CHLORIDE 0.9 % IV SOLN
510.0000 mg | INTRAVENOUS | Status: DC
  Administered 2018-03-09: 510 mg via INTRAVENOUS
  Filled 2018-03-09: qty 17

## 2018-03-09 NOTE — Discharge Instructions (Signed)

## 2018-03-16 ENCOUNTER — Encounter (HOSPITAL_COMMUNITY)
Admission: RE | Admit: 2018-03-16 | Discharge: 2018-03-16 | Disposition: A | Discharge status: Home / Self Care | Payer: Medicare Other | Source: Ambulatory Visit | Attending: Gastroenterology | Admitting: Gastroenterology

## 2018-03-16 DIAGNOSIS — D508 Other iron deficiency anemias: Secondary | ICD-10-CM | POA: Diagnosis not present

## 2018-03-16 MED ORDER — SODIUM CHLORIDE 0.9 % IV SOLN
510.0000 mg | INTRAVENOUS | Status: AC
  Administered 2018-03-16: 510 mg via INTRAVENOUS
  Filled 2018-03-16: qty 17

## 2018-03-23 ENCOUNTER — Ambulatory Visit (HOSPITAL_COMMUNITY)
Admission: RE | Admit: 2018-03-23 | Discharge: 2018-03-23 | Disposition: A | Discharge status: Home / Self Care | Payer: Medicare Other | Source: Ambulatory Visit | Attending: Urology | Admitting: Urology

## 2018-03-23 DIAGNOSIS — N10 Acute tubulo-interstitial nephritis: Secondary | ICD-10-CM | POA: Diagnosis not present

## 2018-03-23 DIAGNOSIS — N281 Cyst of kidney, acquired: Secondary | ICD-10-CM | POA: Insufficient documentation

## 2018-03-23 DIAGNOSIS — D3502 Benign neoplasm of left adrenal gland: Secondary | ICD-10-CM | POA: Insufficient documentation

## 2018-03-23 DIAGNOSIS — D3002 Benign neoplasm of left kidney: Secondary | ICD-10-CM | POA: Diagnosis not present

## 2018-03-23 LAB — POCT I-STAT CREATININE: Creatinine, Ser: 1.4 mg/dL — ABNORMAL HIGH (ref 0.61–1.24)

## 2018-03-23 MED ORDER — GADOBUTROL 1 MMOL/ML IV SOLN
10.0000 mL | Freq: Once | INTRAVENOUS | Status: AC | PRN
  Administered 2018-03-23: 10 mL via INTRAVENOUS

## 2018-03-29 ENCOUNTER — Other Ambulatory Visit: Payer: Self-pay

## 2018-03-29 ENCOUNTER — Telehealth: Payer: Self-pay | Admitting: Gastroenterology

## 2018-03-29 NOTE — Telephone Encounter (Signed)
Patient advised that Dr. Havery Moros wanted to recheck his labs one month out. Patient will come next week to recheck, order in Epic.

## 2018-03-30 DIAGNOSIS — N281 Cyst of kidney, acquired: Secondary | ICD-10-CM | POA: Diagnosis not present

## 2018-04-02 ENCOUNTER — Other Ambulatory Visit (INDEPENDENT_AMBULATORY_CARE_PROVIDER_SITE_OTHER): Payer: Medicare Other

## 2018-04-02 DIAGNOSIS — D508 Other iron deficiency anemias: Secondary | ICD-10-CM

## 2018-04-02 LAB — CBC WITH DIFFERENTIAL/PLATELET
BASOS PCT: 0.3 % (ref 0.0–3.0)
Basophils Absolute: 0 10*3/uL (ref 0.0–0.1)
EOS PCT: 1.1 % (ref 0.0–5.0)
Eosinophils Absolute: 0.1 10*3/uL (ref 0.0–0.7)
HCT: 35.6 % — ABNORMAL LOW (ref 39.0–52.0)
Hemoglobin: 11.4 g/dL — ABNORMAL LOW (ref 13.0–17.0)
Lymphocytes Relative: 28.4 % (ref 12.0–46.0)
Lymphs Abs: 1.9 10*3/uL (ref 0.7–4.0)
MCHC: 32 g/dL (ref 30.0–36.0)
MCV: 87.2 fl (ref 78.0–100.0)
Monocytes Absolute: 0.5 10*3/uL (ref 0.1–1.0)
Monocytes Relative: 7.8 % (ref 3.0–12.0)
NEUTROS PCT: 62.4 % (ref 43.0–77.0)
Neutro Abs: 4.2 10*3/uL (ref 1.4–7.7)
Platelets: 203 10*3/uL (ref 150.0–400.0)
RBC: 4.08 Mil/uL — AB (ref 4.22–5.81)
RDW: 18.3 % — ABNORMAL HIGH (ref 11.5–15.5)
WBC: 6.7 10*3/uL (ref 4.0–10.5)

## 2018-04-03 ENCOUNTER — Other Ambulatory Visit: Payer: Self-pay

## 2018-04-03 ENCOUNTER — Other Ambulatory Visit (INDEPENDENT_AMBULATORY_CARE_PROVIDER_SITE_OTHER): Payer: Medicare Other

## 2018-04-03 DIAGNOSIS — D508 Other iron deficiency anemias: Secondary | ICD-10-CM

## 2018-04-03 DIAGNOSIS — I10 Essential (primary) hypertension: Secondary | ICD-10-CM

## 2018-04-03 DIAGNOSIS — D539 Nutritional anemia, unspecified: Secondary | ICD-10-CM

## 2018-04-03 DIAGNOSIS — D538 Other specified nutritional anemias: Secondary | ICD-10-CM

## 2018-04-03 DIAGNOSIS — E109 Type 1 diabetes mellitus without complications: Secondary | ICD-10-CM

## 2018-04-03 LAB — CBC WITH DIFFERENTIAL/PLATELET
Basophils Absolute: 0 10*3/uL (ref 0.0–0.1)
Basophils Relative: 0.3 % (ref 0.0–3.0)
Eosinophils Absolute: 0.1 10*3/uL (ref 0.0–0.7)
Eosinophils Relative: 1.1 % (ref 0.0–5.0)
HCT: 34.3 % — ABNORMAL LOW (ref 39.0–52.0)
Hemoglobin: 11.1 g/dL — ABNORMAL LOW (ref 13.0–17.0)
LYMPHS ABS: 1.9 10*3/uL (ref 0.7–4.0)
Lymphocytes Relative: 27.4 % (ref 12.0–46.0)
MCHC: 32.3 g/dL (ref 30.0–36.0)
MCV: 87.2 fl (ref 78.0–100.0)
MONO ABS: 0.5 10*3/uL (ref 0.1–1.0)
MONOS PCT: 6.5 % (ref 3.0–12.0)
NEUTROS ABS: 4.5 10*3/uL (ref 1.4–7.7)
NEUTROS PCT: 64.7 % (ref 43.0–77.0)
PLATELETS: 197 10*3/uL (ref 150.0–400.0)
RBC: 3.94 Mil/uL — ABNORMAL LOW (ref 4.22–5.81)
RDW: 18.7 % — AB (ref 11.5–15.5)
WBC: 6.9 10*3/uL (ref 4.0–10.5)

## 2018-04-03 LAB — FOLATE: Folate: 14.3 ng/mL (ref >5.9)

## 2018-04-03 LAB — TSH: TSH: 2.58 u[IU]/mL (ref 0.35–4.50)

## 2018-04-03 LAB — FERRITIN: FERRITIN: 234.2 ng/mL (ref 22.0–322.0)

## 2018-04-03 LAB — VITAMIN B12: Vitamin B-12: 945 pg/mL — ABNORMAL HIGH (ref 211–911)

## 2018-04-03 NOTE — Addendum Note (Signed)
Addended by: Trenda Moots on: 72/82/0601 02:45 PM   Modules accepted: Orders

## 2018-04-04 ENCOUNTER — Other Ambulatory Visit: Payer: Self-pay

## 2018-04-04 DIAGNOSIS — D649 Anemia, unspecified: Secondary | ICD-10-CM

## 2018-04-04 LAB — IRON AND TIBC
Iron Saturation: 24 % (ref 15–55)
Iron: 69 ug/dL (ref 38–169)
TIBC: 287 ug/dL (ref 250–450)
UIBC: 218 ug/dL (ref 111–343)

## 2018-04-06 ENCOUNTER — Telehealth: Payer: Self-pay | Admitting: Hematology

## 2018-04-06 ENCOUNTER — Encounter: Payer: Self-pay | Admitting: Hematology

## 2018-04-06 NOTE — Telephone Encounter (Signed)
New hem referral received from Dr. Havery Moros for anemia. Pt has been scheduled to see Dr. Irene Limbo on 12/9 at 11am. Pt aware to arrive 30 minutes early. Letter mailed.

## 2018-04-10 ENCOUNTER — Ambulatory Visit (INDEPENDENT_AMBULATORY_CARE_PROVIDER_SITE_OTHER): Payer: Medicare Other | Admitting: Internal Medicine

## 2018-04-10 ENCOUNTER — Encounter: Payer: Self-pay | Admitting: Internal Medicine

## 2018-04-10 DIAGNOSIS — R21 Rash and other nonspecific skin eruption: Secondary | ICD-10-CM

## 2018-04-10 MED ORDER — HYDROCODONE-HOMATROPINE 5-1.5 MG/5ML PO SYRP: 5.0000 mL | ORAL_SOLUTION | Freq: Three times a day (TID) | ORAL | 0 refills | Status: DC | PRN

## 2018-04-10 MED ORDER — NYSTATIN-TRIAMCINOLONE 100000-0.1 UNIT/GM-% EX OINT: 1.0000 "application " | TOPICAL_OINTMENT | Freq: Two times a day (BID) | CUTANEOUS | 6 refills | Status: DC

## 2018-04-10 NOTE — Patient Instructions (Signed)
We have sent in a cream to use twice a day for the rash.   We have sent in the cough medicine as well.

## 2018-04-10 NOTE — Progress Notes (Signed)
   Subjective:    Patient ID: [name] [name], male    DOB: January 01, 1934, 84 y.o.   MRN: [id number]  HPI The patient is an 84 YO man coming in for rash. Started a couple of weeks ago. Located on the legs on the thigh. It does itch a lot and he may scratch. He has a urine bag and this sometimes fills at night and leaks. He denies new soap or lotion. He has not been able to see it due to his stomach size and the location of the rash. Overall worsening. Hurts with certain position.   Review of Systems  Constitutional: Negative.   Respiratory: Negative for cough, chest tightness and shortness of breath.   Cardiovascular: Negative for chest pain, palpitations and leg swelling.  Gastrointestinal: Negative for abdominal distention, abdominal pain, constipation, diarrhea, nausea and vomiting.  Musculoskeletal: Negative.   Skin: Positive for rash.  Neurological: Negative.   Psychiatric/Behavioral: Negative.       Objective:   Physical Exam  Constitutional: He is oriented to person, place, and time. He appears well-developed and well-nourished.  HENT:  Head: Normocephalic and atraumatic.  Eyes: EOM are normal.  Neck: Normal range of motion.  Cardiovascular: Normal rate and regular rhythm.  Pulmonary/Chest: Effort normal and breath sounds normal. No respiratory distress. He has no wheezes. He has no rales.  Musculoskeletal: He exhibits no edema.  Neurological: He is alert and oriented to person, place, and time. Coordination normal.  Skin: Skin is warm and dry.  Fungal rash on the thighs and perineal region left and right with some satellite lesions  Psychiatric: He has a normal mood and affect.   Vitals:   04/10/18 1438  BP: 130/80  Pulse: 61  Temp: 98.4 F (36.9 C)  TempSrc: Oral  SpO2: 98%  Weight: 245 lb (111.1 kg)  Height: 5\' 11"  (1.803 m)      Assessment & Plan:

## 2018-04-10 NOTE — Assessment & Plan Note (Signed)
Rx for nystatin triamcinolone cream to use and advised to avoid excessive moisture if able.

## 2018-04-16 ENCOUNTER — Telehealth: Payer: Self-pay | Admitting: Internal Medicine

## 2018-04-16 NOTE — Telephone Encounter (Signed)
Copied from [facility] [id number]. Topic: General - Other >> Apr 13, 2018  4:30 PM Oneta Rack wrote: Relation to pt: self  Call back number: [phone] Pharmacy:  [id number] - [place], [place] [phone] ([phone] (Fax)   Reason for call:  Patient states nystatin-triamcinolone ointment [facility] is expensive and would like an alternate, please advise

## 2018-04-17 MED ORDER — NYSTATIN 100000 UNIT/GM EX OINT: 1.0000 "application " | TOPICAL_OINTMENT | Freq: Two times a day (BID) | CUTANEOUS | 1 refills | Status: DC

## 2018-04-17 MED ORDER — TRIAMCINOLONE ACETONIDE 0.1 % EX CREA: 1.0000 "application " | TOPICAL_CREAM | Freq: Two times a day (BID) | CUTANEOUS | 1 refills | Status: DC

## 2018-04-17 NOTE — Telephone Encounter (Signed)
Sent in 2 separate creams he should use both twice daily.

## 2018-04-17 NOTE — Telephone Encounter (Signed)
LVM informing patient of MD response  

## 2018-04-19 ENCOUNTER — Telehealth: Payer: Self-pay | Admitting: Gastroenterology

## 2018-04-19 ENCOUNTER — Telehealth: Payer: Self-pay | Admitting: *Deleted

## 2018-04-19 NOTE — Telephone Encounter (Signed)
I explained to the patient that he is going to see hematology for his unexplained anemia.  All questions answered.

## 2018-04-19 NOTE — Telephone Encounter (Signed)
Patient called to discuss upcoming appt with Dr. Irene Limbo. States he is followed by a doctor at the [place] for his anemia. States he only saw Dr. Havery Moros because the [place] did not have appts at that time. He has had CT scan and MRI at [place]. States he has to pay copays outside [place] and if care can be there, it is cheaper. Asked what will happen at appt with Dr. Irene Limbo. Advised him that MD will review records and he would met with MD and be examined. Patient verbalized understanding.

## 2018-04-23 ENCOUNTER — Inpatient Hospital Stay: Payer: Medicare Other | Attending: Hematology | Admitting: Hematology

## 2018-04-23 ENCOUNTER — Telehealth: Payer: Self-pay

## 2018-04-23 VITALS — BP 166/82 | HR 68 | Temp 97.8°F | Resp 18 | Ht 71.0 in | Wt 249.3 lb

## 2018-04-23 DIAGNOSIS — Z87891 Personal history of nicotine dependence: Secondary | ICD-10-CM | POA: Diagnosis not present

## 2018-04-23 DIAGNOSIS — D509 Iron deficiency anemia, unspecified: Secondary | ICD-10-CM | POA: Insufficient documentation

## 2018-04-23 DIAGNOSIS — E538 Deficiency of other specified B group vitamins: Secondary | ICD-10-CM

## 2018-04-23 DIAGNOSIS — D649 Anemia, unspecified: Secondary | ICD-10-CM

## 2018-04-23 DIAGNOSIS — I10 Essential (primary) hypertension: Secondary | ICD-10-CM | POA: Diagnosis not present

## 2018-04-23 DIAGNOSIS — E119 Type 2 diabetes mellitus without complications: Secondary | ICD-10-CM | POA: Diagnosis not present

## 2018-04-23 DIAGNOSIS — Z7982 Long term (current) use of aspirin: Secondary | ICD-10-CM | POA: Insufficient documentation

## 2018-04-23 DIAGNOSIS — Z9221 Personal history of antineoplastic chemotherapy: Secondary | ICD-10-CM

## 2018-04-23 DIAGNOSIS — Z79899 Other long term (current) drug therapy: Secondary | ICD-10-CM | POA: Diagnosis not present

## 2018-04-23 DIAGNOSIS — Z8551 Personal history of malignant neoplasm of bladder: Secondary | ICD-10-CM

## 2018-04-23 NOTE — Telephone Encounter (Signed)
Printed avs and calender of upcoming appointment. Per 1/29 los 

## 2018-04-23 NOTE — Progress Notes (Signed)
HEMATOLOGY/ONCOLOGY CONSULTATION NOTE  Date of Service: 04/23/2018  Patient Care Team: Hoyt Koch, MD as PCP - General (Internal Medicine)  CHIEF COMPLAINTS/PURPOSE OF CONSULTATION:  Iron Deficiency Anemia  HISTORY OF PRESENTING ILLNESS:  [name] [name] is a wonderful 84 y.o. male who has been referred to [place] by Dr. Pricilla Holm for evaluation and management of Iron Deficiency Anemia. The pt reports that he is doing well overall.   The pt reports that he has been anemia since 2000 or 2001 when he was treated with chemotherapy for 5 months for bladder cancer. He denies any recurrence of his bladder cancer. The pt notes that he did have an additional cancer in his urethra in 2015, and was treated with a urethral resection. He continues follow up with Dr. Karle Starch at [facility] as well as Urology in the [place]. He also continues follow up with a Futures trader at the [place] in [place] every 6 months, who follows with the pt for his past history of bladder cancer.   The pt notes that he has had stable energy levels. He denies any abdominal pains, and denies any unexpected weight loss. He denies any changes or new difficulty in breathing.   The pt has recently had an Upper Endoscopy on 02/15/18 and a Colonoscopy on 12/22/17, with the resection of multiple polyps. The patient reports that Dr. Havery Moros in GI did not identify the source of his anemia however. He then received IV Feraheme on 03/16/18.   Most recent lab results (04/03/18) of CBC w/diff is as follows: all values are WNL except for RBC at 3.94, HGB at 11.1, HCT at 34.3, RDW at 18.7. 04/03/18 Ferritin at 234.2 (18 days after IV Feraheme)  On review of systems, pt reports stable energy levels, eating well, moving his bowels well, and denies changes in breathing, unexpected weight loss, abdominal pains, blood in the stools, new fatigue, and any other symptoms.   MEDICAL HISTORY:  Past Medical History:  Diagnosis Date   . Adenoma of left adrenal gland    STABLE PER UROLOGIST NOTE DR ESKRIDGE  . Anemia   . Anxiety   . Bilateral dry eyes   . Blood transfusion without reported diagnosis   . Cataract   . Generalized OA   . GERD (gastroesophageal reflux disease)   . History of bladder cancer    S/P RADICAL CYSTECTOMY W/ IDEAL LOOP URINARY DIVERSION  2000  . Hyperlipidemia   . Hypertension   . S/P ileostomy ([place])    URINARY ILEAL  . Type 2 diabetes mellitus ([place])   . Urothelial cancer ([place])   . 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 CATARACT EXTRACTION Right   . CYSTOSCOPY WITH BIOPSY N/A 01/22/2013   Procedure: CYSTOSCOPY WITH BIOPSY;  Surgeon: Fredricka Bonine, MD;  Location: [facility];  Service: Urology;  Laterality: N/A;  Urethra Biopsy  . CYSTOSCOPY WITH BIOPSY N/A 04/16/2013   Procedure: CYSTOSCOPY WITH BIOPSY OF URETHRA/FULGURATION;  Surgeon: Fredricka Bonine, MD;  Location: [facility];  Service: Urology;  Laterality: N/A;  . RADICAL CYSTECTOMY AND ILEAL LOOP DIVERSION  2000   BLADDER CANCER    SOCIAL HISTORY: Social History   Socioeconomic History  . Marital status: Widowed    Spouse name: Not on file  . Number of children: 2  . Years of education: Not on file  . Highest education level:  Not on file  Occupational History  . Occupation: Art gallery manager  . Occupation: CMA  . Occupation: businessman  Social Needs  . Financial resource strain: Not on file  . Food insecurity:    Worry: Not on file    Inability: Not on file  . Transportation needs:    Medical: Not on file    Non-medical: Not on file  Tobacco Use  . Smoking status: Former Smoker    Packs/day: 1.50    Years: 20.00    Pack years: 30.00    Types: Cigarettes    Last attempt to quit: 01/19/1976    Years since quitting: 42.2  . Smokeless tobacco: Never Used  Substance and Sexual Activity  . Alcohol  use: Yes    Comment: OCCASIONAL  . Drug use: No  . Sexual activity: Not on file  Lifestyle  . Physical activity:    Days per week: Not on file    Minutes per session: Not on file  . Stress: Not on file  Relationships  . Social connections:    Talks on phone: Not on file    Gets together: Not on file    Attends religious service: Not on file    Active member of club or organization: Not on file    Attends meetings of clubs or organizations: Not on file    Relationship status: Not on file  . Intimate partner violence:    Fear of current or ex partner: Not on file    Emotionally abused: Not on file    Physically abused: Not on file    Forced sexual activity: Not on file  Other Topics Concern  . Not on file  Social History Narrative    He has a grandson.   Lives alone. [place], but doesn't make long trips.    FAMILY HISTORY: Family History  Problem Relation Age of Onset  . Diabetes Mother   . Colon cancer Neg Hx   . Esophageal cancer Neg Hx   . Rectal cancer Neg Hx   . Stomach cancer Neg Hx     ALLERGIES:  is allergic to lisinopril.  MEDICATIONS:  Current Outpatient Medications  Medication Sig Dispense Refill  . acetaminophen (TYLENOL) 325 MG tablet Take 325-650 mg by mouth as needed for fever or headache.     Marland Kitchen amLODipine (NORVASC) 2.5 MG tablet Take 2.5 mg by mouth every morning.    Marland Kitchen aspirin 325 MG tablet Take 1 tablet (325 mg total) by mouth daily.    Marland Kitchen atenolol (TENORMIN) 25 MG tablet Take 25 mg by mouth every morning.     Marland Kitchen HYDROcodone-homatropine (HYCODAN) 5-1.5 MG/5ML syrup Take 5-10 mLs by mouth every 8 (eight) hours as needed for cough. 120 mL 0  . insulin glargine (LANTUS) 100 UNIT/ML injection Inject 45 Units into the skin at bedtime.     . insulin regular (NOVOLIN R) 100 units/mL injection Inject 5 Units into the skin 3 (three) times daily before meals. Sliding scale as needed    . ketotifen (ZADITOR) 0.025 % ophthalmic solution Place 1 drop into both eyes 2  (two) times daily as needed (irritation).    Marland Kitchen losartan (COZAAR) 50 MG tablet Take 50 mg by mouth 2 (two) times daily.      . magnesium hydroxide (MILK OF MAGNESIA) 400 MG/5ML suspension Take 30 mLs by mouth daily as needed for moderate constipation.     . metFORMIN (GLUCOPHAGE) 1000 MG tablet Take 1,000 mg by mouth 2 (two) times daily  with a meal.      . montelukast (SINGULAIR) 10 MG tablet Take 1 tablet (10 mg total) by mouth at bedtime. 30 tablet 3  . nystatin ointment (MYCOSTATIN) Apply 1 application topically 2 (two) times daily. 100 g 1  . omeprazole (PRILOSEC) 20 MG capsule Take 40 mg by mouth 2 (two) times daily before a meal.     . polyvinyl alcohol (LIQUIFILM TEARS) 1.4 % ophthalmic solution Place 1 drop into both eyes 3 (three) times daily.     . ranitidine (ZANTAC) 150 MG tablet Take 1 tablet (150 mg total) by mouth at bedtime. 90 tablet 1  . simvastatin (ZOCOR) 40 MG tablet Take 20 mg by mouth every evening.    . triamcinolone (NASACORT AQ) 55 MCG/ACT AERO nasal inhaler Place 2 sprays into the nose daily. 1 Inhaler 12  . triamcinolone cream (KENALOG) 0.1 % Apply 1 application topically 2 (two) times daily. 100 g 1  . vitamin B-12 (CYANOCOBALAMIN) 1000 MCG tablet Take 1,000 mcg by mouth daily.     No current facility-administered medications for this visit.     REVIEW OF SYSTEMS:    10 Point review of Systems was done is negative except as noted above.  PHYSICAL EXAMINATION:  . Vitals:   04/23/18 1127  BP: (!) 166/82  Pulse: 68  Resp: 18  Temp: 97.8 F (36.6 C)  SpO2: 99%   Filed Weights   04/23/18 1127  Weight: 249 lb 4.8 oz (113.1 kg)   .Body mass index is 34.77 kg/m.  GENERAL:alert, in no acute distress and comfortable SKIN: no acute rashes, no significant lesions EYES: conjunctiva are pink and non-injected, sclera anicteric OROPHARYNX: MMM, no exudates, no oropharyngeal erythema or ulceration NECK: supple, no JVD LYMPH:  no palpable lymphadenopathy in the  cervical, axillary or inguinal regions LUNGS: clear to auscultation b/l with normal respiratory effort HEART: regular rate & rhythm ABDOMEN:  normoactive bowel sounds , non tender, not distended. Extremity: no pedal edema PSYCH: alert & oriented x 3 with fluent speech NEURO: no focal motor/sensory deficits  LABORATORY DATA:  I have reviewed the data as listed  . CBC Latest Ref Rng & Units 04/03/2018 04/02/2018 02/02/2018  WBC 4.0 - 10.5 K/uL 6.9 6.7 9.5  Hemoglobin 13.0 - 17.0 g/dL 11.1(L) 11.4(L) 10.6(L)  Hematocrit 39.0 - 52.0 % 34.3(L) 35.6(L) 32.8(L)  Platelets 150.0 - 400.0 K/uL 197.0 203.0 249.0    . CMP Latest Ref Rng & Units 03/23/2018 02/02/2018 05/03/2017  Glucose 70 - 99 mg/dL - 136(H) -  BUN 6 - 23 mg/dL - 14 16  Creatinine 0.61 - 1.24 mg/dL 1.40(H) 1.21 1.3  Sodium 135 - 145 mEq/L - 140 142  Potassium 3.5 - 5.1 mEq/L - 4.3 4.2  Chloride 96 - 112 mEq/L - 103 -  CO2 19 - 32 mEq/L - 31 -  Calcium 8.4 - 10.5 mg/dL - 10.2 -  Total Protein 6.0 - 8.3 g/dL - - -  Total Bilirubin 0.3 - 1.2 mg/dL - - -  Alkaline Phos 25 - 125 U/L - - -  AST 14 - 40 U/L - - -  ALT 10 - 40 U/L - - -   . Lab Results  Component Value Date   IRON 69 04/03/2018   TIBC 287 04/03/2018   IRONPCTSAT 24 04/03/2018   (Iron and TIBC)  Lab Results  Component Value Date   FERRITIN 234.2 04/03/2018      RADIOGRAPHIC STUDIES: I have personally reviewed the radiological images  as listed and agreed with the findings in the report. No results found.  ASSESSMENT & PLAN:  84 y.o. male with  1. Minimal normocyctic Anemia PLAN -Discussed patient's most recent labs from 04/03/18, Ferritin at 234.2, HGB at 11.1, 24% Iron Saturation, normal WBC at 6.9k, normal PLT at 197k -Patient's last IV Feraheme infusion was on 03/16/18 -Reviewed that patient's most recent 02/15/18 Upper Endoscopy and 12/22/17 Colonoscopy which revealed multiple polyps that were subsequently resected -Vitamin B12 satisfactory at  945 on 04/03/18 -Currently no role for additional IV Iron replacement with Ferritin at 234, nor is there currently a role for blood transfusion with HGB at 11.1 -Per pt, he has had anemia since his bladder cancer treatment in 2001, which does place him at higher risk for developing MDS -Anemia likely to be IDA from polyps, although could also be an element of low grade MDS given previous chemotherapy treatment -Would hold off on additional aggressive work up unless anemia significanltly worsens  -Will defer follow up and surveillance to Urology for bladder cancer  -Recommend patient's labs be checked again in 4-6 months -Continue follow up with existing medical oncologist at the [place], whom pt sees every 6 months  -Will check labs again in 4-6 months    RTC with Dr Irene Limbo with labs in 6 months   All of the patients questions were answered with apparent satisfaction. The patient knows to call the clinic with any problems, questions or concerns.  The total time spent in the appt was 30 minutes and more than 50% was on counseling and direct patient cares.    Sullivan Lone MD MS AAHIVMS [facility] Hematology/Oncology Physician [facility]  (Office):       [phone] (Work cell):  [phone] (Fax):           [phone]  04/23/2018 12:04 PM  I, Baldwin Jamaica, am acting as a scribe for Dr. Sullivan Lone.   .I have reviewed the above documentation for accuracy and completeness, and I agree with the above. Brunetta Genera MD

## 2018-04-27 ENCOUNTER — Ambulatory Visit (INDEPENDENT_AMBULATORY_CARE_PROVIDER_SITE_OTHER): Payer: Medicare Other | Admitting: Podiatry

## 2018-04-27 ENCOUNTER — Encounter: Payer: Self-pay | Admitting: Podiatry

## 2018-04-27 DIAGNOSIS — M79676 Pain in unspecified toe(s): Secondary | ICD-10-CM | POA: Diagnosis not present

## 2018-04-27 DIAGNOSIS — E119 Type 2 diabetes mellitus without complications: Secondary | ICD-10-CM

## 2018-04-27 DIAGNOSIS — B351 Tinea unguium: Secondary | ICD-10-CM

## 2018-04-27 NOTE — Progress Notes (Signed)
Complaint:  Visit Type: Patient returns to my office for continued preventative foot care services. Complaint: Patient states" my nails have grown long and thick and become painful to walk and wear shoes" Patient has been diagnosed with DM with no foot complications. The patient presents for preventative foot care services. No changes to ROS  Podiatric Exam: Vascular: dorsalis pedis and posterior tibial pulses are palpable bilateral. Capillary return is immediate. Temperature gradient is WNL. Skin turgor WNL  Sensorium: Normal Semmes Weinstein monofilament test. Normal tactile sensation bilaterally. Nail Exam: Pt has thick disfigured discolored nails with subungual debris noted bilateral entire nail hallux through fifth toenails.  Incurvation hallux nails  B/L. Ulcer Exam: There is no evidence of ulcer or pre-ulcerative changes or infection. Orthopedic Exam: Muscle tone and strength are WNL. No limitations in general ROM. No crepitus or effusions noted. Foot type and digits show no abnormalities. Bony prominences are unremarkable. Skin: No Porokeratosis. No infection or ulcers  Diagnosis:  Onychomycosis, , Pain in right toe, pain in left toes  Treatment & Plan Procedures and Treatment: Consent by patient was obtained for treatment procedures.   Debridement of mycotic and hypertrophic toenails, 1 through 5 bilateral and clearing of subungual debris. No ulceration, no infection noted.  Return Visit-Office Procedure: Patient instructed to return to the office for a follow up visit 10 weeks  for continued evaluation and treatment.    Gardiner Barefoot DPM

## 2018-05-07 ENCOUNTER — Ambulatory Visit (INDEPENDENT_AMBULATORY_CARE_PROVIDER_SITE_OTHER): Payer: Medicare Other | Admitting: Internal Medicine

## 2018-05-07 ENCOUNTER — Encounter: Payer: Self-pay | Admitting: Internal Medicine

## 2018-05-07 DIAGNOSIS — R21 Rash and other nonspecific skin eruption: Secondary | ICD-10-CM | POA: Diagnosis not present

## 2018-05-07 MED ORDER — FLUCONAZOLE 150 MG PO TABS: 150.0000 mg | ORAL_TABLET | ORAL | 0 refills | Status: AC

## 2018-05-07 NOTE — Progress Notes (Signed)
   Subjective:   Patient ID: [name] [name], male    DOB: Nov 28, 1933, 84 y.o.   MRN: [id number]  HPI The patient is an 84 YO man coming in for rash. Seen about 1 month ago and given nystatin/triamcinolone cream but this was too expensive so given split rxes. Has been using and it is not getting any better. Went to [place] and they gave him the same cream which also did not help. He is itching quite a lot and this is very uncomfortable. Has urine collection bag but this does not leak. When he had colonoscopy felt that the bag filled and leaked during this which he is not sure caused this or not.   Review of Systems  Constitutional: Negative.   Respiratory: Negative for cough, chest tightness and shortness of breath.   Cardiovascular: Negative for chest pain, palpitations and leg swelling.  Gastrointestinal: Negative for abdominal distention, abdominal pain, constipation, diarrhea, nausea and vomiting.  Musculoskeletal: Negative.   Skin: Positive for rash.  Neurological: Negative.   Psychiatric/Behavioral: Negative.     Objective:  Physical Exam Constitutional:      Appearance: He is well-developed.  HENT:     Head: Normocephalic and atraumatic.  Neck:     Musculoskeletal: Normal range of motion.  Cardiovascular:     Rate and Rhythm: Normal rate and regular rhythm.  Pulmonary:     Effort: Pulmonary effort is normal. No respiratory distress.     Breath sounds: Normal breath sounds. No wheezing or rales.  Abdominal:     General: Bowel sounds are normal. There is no distension.     Palpations: Abdomen is soft.     Tenderness: There is no abdominal tenderness. There is no rebound.  Skin:    General: Skin is warm and dry.     Findings: Rash present.     Comments: Dark skin perineal with some satellite lesions  Neurological:     Mental Status: He is alert and oriented to person, place, and time.     Coordination: Coordination normal.     Vitals:   05/07/18 1354  BP: (!) 150/90    Pulse: 71  Temp: 98.1 F (36.7 C)  TempSrc: Oral  SpO2: 99%  Weight: 248 lb (112.5 kg)  Height: 5\' 11"  (1.803 m)    Assessment & Plan:

## 2018-05-07 NOTE — Patient Instructions (Signed)
We have sent in diflucan to take 1 pill every 3 days until gone. This should help the rash go away faster.   Keep using the nystatin cream.

## 2018-05-07 NOTE — Assessment & Plan Note (Signed)
Still appears yeast skin infection. Has not resolved with nystatin/triamcinolone cream. Will rx diflucan times 3 to see if this will help along with nystatin cream.

## 2018-06-02 ENCOUNTER — Other Ambulatory Visit: Payer: Self-pay | Admitting: Internal Medicine

## 2018-07-05 ENCOUNTER — Ambulatory Visit: Payer: Self-pay | Admitting: *Deleted

## 2018-07-05 MED ORDER — KETOCONAZOLE 2 % EX CREA: 1.0000 "application " | TOPICAL_CREAM | Freq: Two times a day (BID) | CUTANEOUS | 6 refills | Status: DC

## 2018-07-05 NOTE — Telephone Encounter (Signed)
Sent in an additional cream to try ketoconazole to use BID.

## 2018-07-05 NOTE — Addendum Note (Signed)
Addended by: Pricilla Holm A on: 07/05/2018 01:53 PM   Modules accepted: Orders

## 2018-07-05 NOTE — Telephone Encounter (Signed)
Patient informed of MD response  

## 2018-07-05 NOTE — Telephone Encounter (Signed)
Contacted pt regarding symptoms; she said that the previous medication that he was given for rash is not working, and he would like a call back; he says that Dr Sharlet Salina "knows it looks like", and he does not want to come in because he has too many medical bills; the pt also says that he was told to call back if this does not help; he uses [facility]; the pt can be contacted at [phone]; pt seen in office by Dr Sharlet Salina, [place], 04/10/18 and 05/07/18.     Reason for Disposition . Localized rash present > 7 days  Answer Assessment - Initial Assessment Questions 1. APPEARANCE of RASH: "Describe the rash."      *No Answer* 2. LOCATION: "Where is the rash located?"      *No Answer* 3. NUMBER: "How many spots are there?"      *No Answer* 4. SIZE: "How big are the spots?" (Inches, centimeters or compare to size of a coin)      *No Answer* 5. ONSET: "When did the rash start?"     Seen in office 04/10/18 6. ITCHING: "Does the rash itch?" If so, ask: "How bad is the itch?"  (Scale 1-10; or mild, moderate, severe)     10 out of 10 7. PAIN: "Does the rash hurt?" If so, ask: "How bad is the pain?"  (Scale 1-10; or mild, moderate, severe)     *No Answer* 8. OTHER SYMPTOMS: "Do you have any other symptoms?" (e.g., fever)    9. PREGNANCY: "Is there any chance you are pregnant?" "When was your last menstrual period?"     *No Answer*  Protocols used: RASH OR REDNESS - LOCALIZED-A-AH

## 2018-07-20 ENCOUNTER — Encounter: Payer: Self-pay | Admitting: Podiatry

## 2018-07-20 ENCOUNTER — Ambulatory Visit (INDEPENDENT_AMBULATORY_CARE_PROVIDER_SITE_OTHER): Payer: Medicare Other | Admitting: Podiatry

## 2018-07-20 DIAGNOSIS — E119 Type 2 diabetes mellitus without complications: Secondary | ICD-10-CM

## 2018-07-20 DIAGNOSIS — M79676 Pain in unspecified toe(s): Secondary | ICD-10-CM

## 2018-07-20 DIAGNOSIS — B351 Tinea unguium: Secondary | ICD-10-CM | POA: Diagnosis not present

## 2018-07-20 NOTE — Progress Notes (Signed)
Complaint:  Visit Type: Patient returns to my office for continued preventative foot care services. Complaint: Patient states" my nails have grown long and thick and become painful to walk and wear shoes" Patient has been diagnosed with DM with no foot complications. The patient presents for preventative foot care services. No changes to ROS  Podiatric Exam: Vascular: dorsalis pedis and posterior tibial pulses are palpable bilateral. Capillary return is immediate. Temperature gradient is WNL. Skin turgor WNL  Sensorium: Normal Semmes Weinstein monofilament test. Normal tactile sensation bilaterally. Nail Exam: Pt has thick disfigured discolored nails with subungual debris noted bilateral entire nail hallux through fifth toenails.  Incurvation hallux nails  B/L. Ulcer Exam: There is no evidence of ulcer or pre-ulcerative changes or infection. Orthopedic Exam: Muscle tone and strength are WNL. No limitations in general ROM. No crepitus or effusions noted. Foot type and digits show no abnormalities. Bony prominences are unremarkable. Skin: No Porokeratosis. No infection or ulcers  Diagnosis:  Onychomycosis, , Pain in right toe, pain in left toes  Treatment & Plan Procedures and Treatment: Consent by patient was obtained for treatment procedures.   Debridement of mycotic and hypertrophic toenails, 1 through 5 bilateral and clearing of subungual debris. No ulceration, no infection noted.  Return Visit-Office Procedure: Patient instructed to return to the office for a follow up visit 4 months.  for continued evaluation and treatment.    Dwayna Kentner DPM 

## 2018-08-28 ENCOUNTER — Telehealth: Payer: Self-pay | Admitting: *Deleted

## 2018-08-28 NOTE — Telephone Encounter (Signed)
LVM for patient to call back in regards to scheduling AWV with our health coach.  

## 2018-08-30 ENCOUNTER — Telehealth: Payer: Self-pay | Admitting: Podiatry

## 2018-08-30 ENCOUNTER — Ambulatory Visit: Payer: Self-pay | Admitting: *Deleted

## 2018-08-30 NOTE — Telephone Encounter (Signed)
Pt having swelling in both feet and would like to know if there is anything at home he can do about it.

## 2018-08-30 NOTE — Telephone Encounter (Signed)
Would you like in office or telephone call?

## 2018-08-30 NOTE — Telephone Encounter (Signed)
Patient informed to follow care instructions and call back if it does not help. Patient stated understanding

## 2018-08-30 NOTE — Telephone Encounter (Signed)
I called pt and informed if he has not had any type of injury to the feet, he should rest, and elevate as much as possible when swollen, no constrictive bands at top of socks and contact PCP. Pt states understanding.

## 2018-08-30 NOTE — Telephone Encounter (Signed)
Can follow care advice and call back if not helping.

## 2018-08-30 NOTE — Telephone Encounter (Signed)
Patient noticed ankles and feet are swelling as the day goes on. Started on the 10th. Edema decreases at night. No warmth/redness/sores/ulcers/red streaks. No fever or illness reported. Left foot greater than right.Reports mild tenderness if he pushes on the area.Denies swelling of lower legs. Hx of bladder cancer with an ostomy. No decrease in urinary output, he stated during this time. No added Na to foods. No SOB/CP.Rash on his groin area he has been treating with a prescribed cream.INstruted patient to elevate legs and feet twice daily and no socks with elastic bands at this time. If SOB/CP/increased swelling/calf pain to call back immediately. Stated he understood.No travels/no known exposures. No smart phone no computer. Spoke with office. Routing encounter for PCP to contact regarding appointment.  Reason for Disposition . [1] MILD swelling of both ankles (i.e., pedal edema) AND [2] new onset or worsening  Answer Assessment - Initial Assessment Questions 1. ONSET: "When did the swelling start?" (e.g., minutes, hours, days)     Noticed his feet swelling last Friday. 2. LOCATION: "What part of the leg is swollen?"  "Are both legs swollen or just one leg?"   Both feet left greater than right foot  3. SEVERITY: "How bad is the swelling?" (e.g., localized; mild, moderate, severe)  - Localized - small area of swelling localized to one leg  - MILD pedal edema - swelling limited to foot and ankle, pitting edema < 1/4 inch (6 mm) deep, rest and elevation eliminate most or all swelling  - MODERATE edema - swelling of lower leg to knee, pitting edema > 1/4 inch (6 mm) deep, rest and elevation only partially reduce swelling  - SEVERE edema - swelling extends above knee, facial or hand swelling present      Mild includes ankles and feet.  4. REDNESS: "Does the swelling look red or infected?"     no 5. PAIN: "Is the swelling painful to touch?" If so, ask: "How painful is it?"   (Scale 1-10; mild, moderate  or severe)     Tender if presses on them 6. FEVER: "Do you have a fever?" If so, ask: "What is it, how was it measured, and when did it start?"      no 7. CAUSE: "What do you think is causing the leg swelling?"     unsure 8. MEDICAL HISTORY: "Do you have a history of heart failure, kidney disease, liver failure, or cancer?"     Bladder cancer 9. RECURRENT SYMPTOM: "Have you had leg swelling before?" If so, ask: "When was the last time?" "What happened that time?"     nope 10. OTHER SYMPTOMS: "Do you have any other symptoms?" (e.g., chest pain, difficulty breathing)      denies 11. PREGNANCY: "Is there any chance you are pregnant?" "When was your last menstrual period?"       na  Protocols used: LEG SWELLING AND EDEMA-A-AH

## 2018-10-19 ENCOUNTER — Ambulatory Visit (INDEPENDENT_AMBULATORY_CARE_PROVIDER_SITE_OTHER): Payer: Medicare Other | Admitting: Podiatry

## 2018-10-19 ENCOUNTER — Other Ambulatory Visit: Payer: Self-pay

## 2018-10-19 ENCOUNTER — Encounter: Payer: Self-pay | Admitting: Podiatry

## 2018-10-19 VITALS — Temp 97.5°F

## 2018-10-19 DIAGNOSIS — B351 Tinea unguium: Secondary | ICD-10-CM

## 2018-10-19 DIAGNOSIS — M79676 Pain in unspecified toe(s): Secondary | ICD-10-CM | POA: Diagnosis not present

## 2018-10-19 DIAGNOSIS — E119 Type 2 diabetes mellitus without complications: Secondary | ICD-10-CM

## 2018-10-19 NOTE — Progress Notes (Signed)
Complaint:  Visit Type: Patient returns to my office for continued preventative foot care services. Complaint: Patient states" my nails have grown long and thick and become painful to walk and wear shoes" Patient has been diagnosed with DM with no foot complications. The patient presents for preventative foot care services. No changes to ROS  Podiatric Exam: Vascular: dorsalis pedis and posterior tibial pulses are palpable bilateral. Capillary return is immediate. Temperature gradient is WNL. Skin turgor WNL  Sensorium: Normal Semmes Weinstein monofilament test. Normal tactile sensation bilaterally. Nail Exam: Pt has thick disfigured discolored nails with subungual debris noted bilateral entire nail hallux through fifth toenails.  Incurvation hallux nails  B/L. Ulcer Exam: There is no evidence of ulcer or pre-ulcerative changes or infection. Orthopedic Exam: Muscle tone and strength are WNL. No limitations in general ROM. No crepitus or effusions noted. Foot type and digits show no abnormalities. Bony prominences are unremarkable. Skin: No Porokeratosis. No infection or ulcers  Diagnosis:  Onychomycosis, , Pain in right toe, pain in left toes  Treatment & Plan Procedures and Treatment: Consent by patient was obtained for treatment procedures.   Debridement of mycotic and hypertrophic toenails, 1 through 5 bilateral and clearing of subungual debris. No ulceration, no infection noted.  Return Visit-Office Procedure: Patient instructed to return to the office for a follow up visit 4 months.  for continued evaluation and treatment.    Gardiner Barefoot DPM

## 2018-10-22 ENCOUNTER — Other Ambulatory Visit: Payer: Medicare Other

## 2018-10-22 ENCOUNTER — Ambulatory Visit: Payer: Medicare Other | Admitting: Hematology

## 2018-12-28 ENCOUNTER — Other Ambulatory Visit: Payer: Self-pay

## 2018-12-28 ENCOUNTER — Encounter: Payer: Self-pay | Admitting: Podiatry

## 2018-12-28 ENCOUNTER — Ambulatory Visit (INDEPENDENT_AMBULATORY_CARE_PROVIDER_SITE_OTHER): Payer: Medicare Other | Admitting: Podiatry

## 2018-12-28 VITALS — Temp 97.9°F

## 2018-12-28 DIAGNOSIS — E119 Type 2 diabetes mellitus without complications: Secondary | ICD-10-CM

## 2018-12-28 DIAGNOSIS — B351 Tinea unguium: Secondary | ICD-10-CM

## 2018-12-28 DIAGNOSIS — M79676 Pain in unspecified toe(s): Secondary | ICD-10-CM

## 2018-12-28 NOTE — Progress Notes (Signed)
Complaint:  Visit Type: Patient returns to my office for continued preventative foot care services. Complaint: Patient states" my nails have grown long and thick and become painful to walk and wear shoes" Patient has been diagnosed with DM with no foot complications. The patient presents for preventative foot care services. No changes to ROS  Podiatric Exam: Vascular: dorsalis pedis and posterior tibial pulses are palpable bilateral. Capillary return is immediate. Temperature gradient is WNL. Skin turgor WNL  Sensorium: Normal Semmes Weinstein monofilament test. Normal tactile sensation bilaterally. Nail Exam: Pt has thick disfigured discolored nails with subungual debris noted bilateral entire nail hallux through fifth toenails.  Incurvation hallux nails  B/L. Ulcer Exam: There is no evidence of ulcer or pre-ulcerative changes or infection. Orthopedic Exam: Muscle tone and strength are WNL. No limitations in general ROM. No crepitus or effusions noted. Foot type and digits show no abnormalities. Bony prominences are unremarkable. Skin: No Porokeratosis. No infection or ulcers  Diagnosis:  Onychomycosis, , Pain in right toe, pain in left toes  Treatment & Plan Procedures and Treatment: Consent by patient was obtained for treatment procedures.   Debridement of mycotic and hypertrophic toenails, 1 through 5 bilateral and clearing of subungual debris. No ulceration, no infection noted.  Return Visit-Office Procedure: Patient instructed to return to the office for a follow up visit 3  months.  for continued evaluation and treatment.    Gardiner Barefoot DPM

## 2019-01-25 ENCOUNTER — Encounter: Payer: Self-pay | Admitting: *Deleted

## 2019-01-25 ENCOUNTER — Ambulatory Visit (INDEPENDENT_AMBULATORY_CARE_PROVIDER_SITE_OTHER): Payer: Medicare Other

## 2019-01-25 ENCOUNTER — Other Ambulatory Visit: Payer: Self-pay

## 2019-01-25 DIAGNOSIS — Z23 Encounter for immunization: Secondary | ICD-10-CM | POA: Diagnosis not present

## 2019-04-05 ENCOUNTER — Ambulatory Visit (INDEPENDENT_AMBULATORY_CARE_PROVIDER_SITE_OTHER): Payer: Medicare Other | Admitting: Podiatry

## 2019-04-05 ENCOUNTER — Encounter: Payer: Self-pay | Admitting: Podiatry

## 2019-04-05 ENCOUNTER — Other Ambulatory Visit: Payer: Self-pay

## 2019-04-05 DIAGNOSIS — B351 Tinea unguium: Secondary | ICD-10-CM

## 2019-04-05 DIAGNOSIS — E119 Type 2 diabetes mellitus without complications: Secondary | ICD-10-CM

## 2019-04-05 DIAGNOSIS — M79676 Pain in unspecified toe(s): Secondary | ICD-10-CM

## 2019-04-05 NOTE — Progress Notes (Signed)
Complaint:  Visit Type: Patient returns to my office for continued preventative foot care services. Complaint: Patient states" my nails have grown long and thick and become painful to walk and wear shoes" Patient has been diagnosed with DM with no foot complications. The patient presents for preventative foot care services. No changes to ROS  Podiatric Exam: Vascular: dorsalis pedis and posterior tibial pulses are palpable bilateral. Capillary return is immediate. Temperature gradient is WNL. Skin turgor WNL  Sensorium: Normal Semmes Weinstein monofilament test. Normal tactile sensation bilaterally. Nail Exam: Pt has thick disfigured discolored nails with subungual debris noted bilateral entire nail hallux through fifth toenails.  Incurvation hallux nails  B/L. Ulcer Exam: There is no evidence of ulcer or pre-ulcerative changes or infection. Orthopedic Exam: Muscle tone and strength are WNL. No limitations in general ROM. No crepitus or effusions noted. Foot type and digits show no abnormalities. Bony prominences are unremarkable. Skin: No Porokeratosis. No infection or ulcers  Diagnosis:  Onychomycosis, , Pain in right toe, pain in left toes  Treatment & Plan Procedures and Treatment: Consent by patient was obtained for treatment procedures.   Debridement of mycotic and hypertrophic toenails, 1 through 5 bilateral and clearing of subungual debris. No ulceration, no infection noted.  Return Visit-Office Procedure: Patient instructed to return to the office for a follow up visit 3  months.  for continued evaluation and treatment.    Louella Medaglia DPM 

## 2019-06-09 ENCOUNTER — Ambulatory Visit: Payer: Medicare Other | Attending: Internal Medicine

## 2019-06-09 DIAGNOSIS — Z23 Encounter for immunization: Secondary | ICD-10-CM | POA: Insufficient documentation

## 2019-06-09 NOTE — Progress Notes (Signed)
   Covid-19 Vaccination Clinic  Name:  [name] [name]    MRN: [id number] DOB: 10/11/1933  06/09/2019  [name] was observed post Covid-19 immunization for 15 minutes without incidence. He was provided with Vaccine Information Sheet and instruction to access the V-Safe system.   [name] was instructed to call 911 with any severe reactions post vaccine: Marland Kitchen Difficulty breathing  . Swelling of your face and throat  . A fast heartbeat  . A bad rash all over your body  . Dizziness and weakness    Immunizations Administered    Name Date Dose VIS Date Route   Pfizer COVID-19 Vaccine 06/09/2019 11:29 AM 0.3 mL 04/26/2019 Intramuscular   Manufacturer: [place]   Lot: [id number]   [place]: [id number]

## 2019-06-25 DIAGNOSIS — D3000 Benign neoplasm of unspecified kidney: Secondary | ICD-10-CM | POA: Diagnosis not present

## 2019-06-25 DIAGNOSIS — Z8551 Personal history of malignant neoplasm of bladder: Secondary | ICD-10-CM | POA: Diagnosis not present

## 2019-06-25 DIAGNOSIS — Z936 Other artificial openings of urinary tract status: Secondary | ICD-10-CM | POA: Diagnosis not present

## 2019-06-28 ENCOUNTER — Encounter: Payer: Self-pay | Admitting: Podiatry

## 2019-06-28 ENCOUNTER — Ambulatory Visit (INDEPENDENT_AMBULATORY_CARE_PROVIDER_SITE_OTHER): Payer: Medicare Other | Admitting: Podiatry

## 2019-06-28 ENCOUNTER — Other Ambulatory Visit: Payer: Self-pay

## 2019-06-28 DIAGNOSIS — M79676 Pain in unspecified toe(s): Secondary | ICD-10-CM

## 2019-06-28 DIAGNOSIS — E119 Type 2 diabetes mellitus without complications: Secondary | ICD-10-CM

## 2019-06-28 DIAGNOSIS — B351 Tinea unguium: Secondary | ICD-10-CM | POA: Diagnosis not present

## 2019-06-28 NOTE — Progress Notes (Signed)
Complaint:  Visit Type: Patient returns to my office for continued preventative foot care services. Complaint: Patient states" my nails have grown long and thick and become painful to walk and wear shoes" Patient has been diagnosed with DM with no foot complications. The patient presents for preventative foot care services. No changes to ROS  Podiatric Exam: Vascular: dorsalis pedis and posterior tibial pulses are palpable bilateral. Capillary return is immediate. Temperature gradient is WNL. Skin turgor WNL  Sensorium: Normal Semmes Weinstein monofilament test. Normal tactile sensation bilaterally. Nail Exam: Pt has thick disfigured discolored nails with subungual debris noted bilateral entire nail hallux through fifth toenails.  Incurvation hallux nails  B/L. Ulcer Exam: There is no evidence of ulcer or pre-ulcerative changes or infection. Orthopedic Exam: Muscle tone and strength are WNL. No limitations in general ROM. No crepitus or effusions noted. Foot type and digits show no abnormalities. Bony prominences are unremarkable. Skin: No Porokeratosis. No infection or ulcers  Diagnosis:  Onychomycosis, , Pain in right toe, pain in left toes  Treatment & Plan Procedures and Treatment: Consent by patient was obtained for treatment procedures.   Debridement of mycotic and hypertrophic toenails, 1 through 5 bilateral and clearing of subungual debris. No ulceration, no infection noted.  Return Visit-Office Procedure: Patient instructed to return to the office for a follow up visit 3  months.  for continued evaluation and treatment.    Gardiner Barefoot DPM

## 2019-07-01 ENCOUNTER — Ambulatory Visit: Payer: Medicare Other | Attending: Internal Medicine

## 2019-07-01 DIAGNOSIS — Z23 Encounter for immunization: Secondary | ICD-10-CM

## 2019-07-01 NOTE — Progress Notes (Signed)
   Covid-19 Vaccination Clinic  Name:  [name] [name]    MRN: [id number] DOB: February 25, 1934  07/01/2019  [name] was observed post Covid-19 immunization for 15 minutes without incidence. He was provided with Vaccine Information Sheet and instruction to access the V-Safe system.   Mr. Deal was instructed to call 911 with any severe reactions post vaccine: Marland Kitchen Difficulty breathing  . Swelling of your face and throat  . A fast heartbeat  . A bad rash all over your body  . Dizziness and weakness    Immunizations Administered    Name Date Dose VIS Date Route   Pfizer COVID-19 Vaccine 07/01/2019  8:11 AM 0.3 mL 04/26/2019 Intramuscular   Manufacturer: [place]   Lot: [id number]   [place]: [id number]

## 2019-07-11 DIAGNOSIS — N281 Cyst of kidney, acquired: Secondary | ICD-10-CM | POA: Diagnosis not present

## 2019-07-11 DIAGNOSIS — C679 Malignant neoplasm of bladder, unspecified: Secondary | ICD-10-CM | POA: Diagnosis not present

## 2019-07-25 DIAGNOSIS — Z936 Other artificial openings of urinary tract status: Secondary | ICD-10-CM | POA: Diagnosis not present

## 2019-07-25 DIAGNOSIS — D3000 Benign neoplasm of unspecified kidney: Secondary | ICD-10-CM | POA: Diagnosis not present

## 2019-07-25 DIAGNOSIS — Z8551 Personal history of malignant neoplasm of bladder: Secondary | ICD-10-CM | POA: Diagnosis not present

## 2019-08-12 ENCOUNTER — Telehealth: Payer: Self-pay | Admitting: Internal Medicine

## 2019-08-12 NOTE — Progress Notes (Signed)
  Chronic Care Management   Outreach Note  08/12/2019 Name: [name] [name] MRN: [id number] DOB: 19-May-1933  Referred by: Hoyt Koch, MD Reason for referral : No chief complaint on file.   An unsuccessful telephone outreach was attempted today. The patient was referred to the pharmacist for assistance with care management and care coordination.   Follow Up Plan:   Raynicia Dukes [employer] Scheduler

## 2019-10-02 ENCOUNTER — Encounter: Payer: Self-pay | Admitting: Podiatry

## 2019-10-02 ENCOUNTER — Ambulatory Visit (INDEPENDENT_AMBULATORY_CARE_PROVIDER_SITE_OTHER): Payer: Medicare Other | Admitting: Podiatry

## 2019-10-02 ENCOUNTER — Other Ambulatory Visit: Payer: Self-pay

## 2019-10-02 VITALS — Temp 97.5°F

## 2019-10-02 DIAGNOSIS — M79676 Pain in unspecified toe(s): Secondary | ICD-10-CM

## 2019-10-02 DIAGNOSIS — B351 Tinea unguium: Secondary | ICD-10-CM | POA: Diagnosis not present

## 2019-10-02 DIAGNOSIS — E119 Type 2 diabetes mellitus without complications: Secondary | ICD-10-CM | POA: Diagnosis not present

## 2019-10-02 NOTE — Progress Notes (Signed)
This patient returns to my office for at risk foot care.  This patient requires this care by a professional since this patient will be at risk due to having type 1 diabetes.   This patient is unable to cut nails himself since the patient cannot reach his nails.These nails are painful walking and wearing shoes.  This patient presents for at risk foot care today.  General Appearance  Alert, conversant and in no acute stress.  Vascular  Dorsalis pedis and posterior tibial  pulses are palpable  bilaterally.  Capillary return is within normal limits  bilaterally. Temperature is within normal limits  bilaterally.  Neurologic  Senn-Weinstein monofilament wire test within normal limits  bilaterally. Muscle power within normal limits bilaterally.  Nails Thick disfigured discolored nails with subungual debris  from hallux to fifth toes bilaterally. No evidence of bacterial infection or drainage bilaterally.  Orthopedic  No limitations of motion  feet .  No crepitus or effusions noted.  No bony pathology or digital deformities noted.  Skin  normotropic skin with no porokeratosis noted bilaterally.  No signs of infections or ulcers noted.     Onychomycosis  Pain in right toes  Pain in left toes  Consent was obtained for treatment procedures.   Mechanical debridement of nails 1-5  bilaterally performed with a nail nipper.  Filed with dremel without incident.    Return office visit    12 weeks                  Told patient to return for periodic foot care and evaluation due to potential at risk complications.   Gregory Mayer DPM  

## 2019-10-25 ENCOUNTER — Telehealth: Payer: Self-pay | Admitting: Internal Medicine

## 2019-10-25 NOTE — Progress Notes (Signed)
  Chronic Care Management   Outreach Note  10/25/2019 Name: [name] [name] MRN: [id number] DOB: 01-Oct-1933  Referred by: Hoyt Koch, MD Reason for referral : No chief complaint on file.   An unsuccessful telephone outreach was attempted today. The patient was referred to the pharmacist for assistance with care management and care coordination. This note is not being shared with the patient for the following reason: To respect privacy (The patient or proxy has requested that the information not be shared).  Follow Up Plan:   Earney Hamburg [employer] Scheduler

## 2019-11-07 ENCOUNTER — Emergency Department (HOSPITAL_COMMUNITY)
Admission: EM | Admit: 2019-11-07 | Discharge: 2019-11-07 | Discharge status: Against Medical Advice | Payer: Medicare Other | Attending: Emergency Medicine | Admitting: Emergency Medicine

## 2019-11-07 ENCOUNTER — Other Ambulatory Visit: Payer: Self-pay

## 2019-11-07 ENCOUNTER — Encounter (HOSPITAL_COMMUNITY): Payer: Self-pay | Admitting: Emergency Medicine

## 2019-11-07 DIAGNOSIS — R109 Unspecified abdominal pain: Secondary | ICD-10-CM | POA: Diagnosis not present

## 2019-11-07 LAB — COMPREHENSIVE METABOLIC PANEL
ALT: 18 U/L (ref 0–44)
AST: 19 U/L (ref 15–41)
Albumin: 3.6 g/dL (ref 3.5–5.0)
Alkaline Phosphatase: 69 U/L (ref 38–126)
Anion gap: 7 (ref 5–15)
BUN: 15 mg/dL (ref 8–23)
CO2: 29 mmol/L (ref 22–32)
Calcium: 9.6 mg/dL (ref 8.9–10.3)
Chloride: 103 mmol/L (ref 98–111)
Creatinine, Ser: 1.34 mg/dL — ABNORMAL HIGH (ref 0.61–1.24)
GFR calc Af Amer: 56 mL/min — ABNORMAL LOW (ref >60)
GFR calc non Af Amer: 48 mL/min — ABNORMAL LOW (ref >60)
Glucose, Bld: 118 mg/dL — ABNORMAL HIGH (ref 70–99)
Potassium: 4.8 mmol/L (ref 3.5–5.1)
Sodium: 139 mmol/L (ref 135–145)
Total Bilirubin: 0.5 mg/dL (ref 0.3–1.2)
Total Protein: 7.5 g/dL (ref 6.5–8.1)

## 2019-11-07 LAB — CBC
HCT: 34.9 % — ABNORMAL LOW (ref 39.0–52.0)
Hemoglobin: 10.5 g/dL — ABNORMAL LOW (ref 13.0–17.0)
MCH: 27.7 pg (ref 26.0–34.0)
MCHC: 30.1 g/dL (ref 30.0–36.0)
MCV: 92.1 fL (ref 80.0–100.0)
Platelets: 221 10*3/uL (ref 150–400)
RBC: 3.79 MIL/uL — ABNORMAL LOW (ref 4.22–5.81)
RDW: 15.9 % — ABNORMAL HIGH (ref 11.5–15.5)
WBC: 6.3 10*3/uL (ref 4.0–10.5)
nRBC: 0 % (ref 0.0–0.2)

## 2019-11-07 LAB — LIPASE, BLOOD: Lipase: 32 U/L (ref 11–51)

## 2019-11-07 MED ORDER — SODIUM CHLORIDE 0.9% FLUSH: 3.0000 mL | Freq: Once | INTRAVENOUS | Status: DC

## 2019-11-07 NOTE — ED Notes (Signed)
Pt called 3X for room placement. No answer. Eloped from ER.

## 2019-11-07 NOTE — ED Triage Notes (Signed)
Pt c/o lower abd pains since last Thursday after eating chicken sub from [employer]. reports hasnt been able ot have a BM until this morning and had little amount and had to strain a lot to get it  Out.

## 2020-01-03 ENCOUNTER — Other Ambulatory Visit: Payer: Self-pay

## 2020-01-03 ENCOUNTER — Ambulatory Visit (INDEPENDENT_AMBULATORY_CARE_PROVIDER_SITE_OTHER): Payer: Medicare Other | Admitting: Podiatry

## 2020-01-03 ENCOUNTER — Encounter: Payer: Self-pay | Admitting: Podiatry

## 2020-01-03 DIAGNOSIS — E119 Type 2 diabetes mellitus without complications: Secondary | ICD-10-CM | POA: Diagnosis not present

## 2020-01-03 DIAGNOSIS — B351 Tinea unguium: Secondary | ICD-10-CM | POA: Diagnosis not present

## 2020-01-03 DIAGNOSIS — M79676 Pain in unspecified toe(s): Secondary | ICD-10-CM

## 2020-01-03 NOTE — Progress Notes (Signed)
This patient returns to my office for at risk foot care.  This patient requires this care by a professional since this patient will be at risk due to having type 1 diabetes.   This patient is unable to cut nails himself since the patient cannot reach his nails.These nails are painful walking and wearing shoes.  This patient presents for at risk foot care today.  General Appearance  Alert, conversant and in no acute stress.  Vascular  Dorsalis pedis and posterior tibial  pulses are palpable  bilaterally.  Capillary return is within normal limits  bilaterally. Temperature is within normal limits  bilaterally.  Neurologic  Senn-Weinstein monofilament wire test within normal limits  bilaterally. Muscle power within normal limits bilaterally.  Nails Thick disfigured discolored nails with subungual debris  from hallux to fifth toes bilaterally. No evidence of bacterial infection or drainage bilaterally.  Orthopedic  No limitations of motion  feet .  No crepitus or effusions noted.  No bony pathology or digital deformities noted.  Skin  normotropic skin with no porokeratosis noted bilaterally.  No signs of infections or ulcers noted.     Onychomycosis  Pain in right toes  Pain in left toes  Consent was obtained for treatment procedures.   Mechanical debridement of nails 1-5  bilaterally performed with a nail nipper.  Filed with dremel without incident.    Return office visit    12 weeks                  Told patient to return for periodic foot care and evaluation due to potential at risk complications.   Kaelah Hayashi DPM  

## 2020-02-25 ENCOUNTER — Ambulatory Visit: Payer: Medicare Other | Attending: Internal Medicine

## 2020-02-25 DIAGNOSIS — Z23 Encounter for immunization: Secondary | ICD-10-CM

## 2020-02-25 NOTE — Progress Notes (Signed)
   Covid-19 Vaccination Clinic  Name:  [name] [name]    MRN: [id number] DOB: 27-Oct-1933  02/25/2020  [name] was observed post Covid-19 immunization for 15 minutes without incident. He was provided with Vaccine Information Sheet and instruction to access the V-Safe system.   [name] [name] was instructed to call 911 with any severe reactions post vaccine: Marland Kitchen Difficulty breathing  . Swelling of face and throat  . A fast heartbeat  . A bad rash all over body  . Dizziness and weakness

## 2020-03-06 ENCOUNTER — Ambulatory Visit (INDEPENDENT_AMBULATORY_CARE_PROVIDER_SITE_OTHER): Payer: Medicare Other | Admitting: *Deleted

## 2020-03-06 ENCOUNTER — Other Ambulatory Visit: Payer: Self-pay

## 2020-03-06 DIAGNOSIS — Z23 Encounter for immunization: Secondary | ICD-10-CM

## 2020-04-03 ENCOUNTER — Ambulatory Visit (INDEPENDENT_AMBULATORY_CARE_PROVIDER_SITE_OTHER): Payer: Medicare Other | Admitting: Podiatry

## 2020-04-03 ENCOUNTER — Other Ambulatory Visit: Payer: Self-pay

## 2020-04-03 ENCOUNTER — Encounter: Payer: Self-pay | Admitting: Podiatry

## 2020-04-03 DIAGNOSIS — E119 Type 2 diabetes mellitus without complications: Secondary | ICD-10-CM

## 2020-04-03 DIAGNOSIS — M79676 Pain in unspecified toe(s): Secondary | ICD-10-CM

## 2020-04-03 DIAGNOSIS — B351 Tinea unguium: Secondary | ICD-10-CM

## 2020-04-03 NOTE — Progress Notes (Signed)
This patient returns to my office for at risk foot care.  This patient requires this care by a professional since this patient will be at risk due to having type 1 diabetes.   This patient is unable to cut nails himself since the patient cannot reach his nails.These nails are painful walking and wearing shoes.  This patient presents for at risk foot care today.  General Appearance  Alert, conversant and in no acute stress.  Vascular  Dorsalis pedis and posterior tibial  pulses are palpable  bilaterally.  Capillary return is within normal limits  bilaterally. Temperature is within normal limits  bilaterally.  Neurologic  Senn-Weinstein monofilament wire test within normal limits  bilaterally. Muscle power within normal limits bilaterally.  Nails Thick disfigured discolored nails with subungual debris  from hallux to fifth toes bilaterally. No evidence of bacterial infection or drainage bilaterally.  Orthopedic  No limitations of motion  feet .  No crepitus or effusions noted.  No bony pathology or digital deformities noted.  Skin  normotropic skin with no porokeratosis noted bilaterally.  No signs of infections or ulcers noted.     Onychomycosis  Pain in right toes  Pain in left toes  Consent was obtained for treatment procedures.   Mechanical debridement of nails 1-5  bilaterally performed with a nail nipper.  Filed with dremel without incident.    Return office visit    12 weeks                  Told patient to return for periodic foot care and evaluation due to potential at risk complications.   Naomie Crow DPM  

## 2020-04-22 ENCOUNTER — Telehealth: Payer: Self-pay | Admitting: Internal Medicine

## 2020-04-22 NOTE — Telephone Encounter (Signed)
    Patient requesting referral to Ophthalmology for diabetic eye exam He states he was going to [place], but its too far to drive

## 2020-04-23 NOTE — Telephone Encounter (Signed)
Pt informed of below.  

## 2020-04-23 NOTE — Telephone Encounter (Signed)
Should not need referral, can just call an eye specialist to make apt.

## 2020-07-10 ENCOUNTER — Encounter: Payer: Self-pay | Admitting: Podiatry

## 2020-07-10 ENCOUNTER — Ambulatory Visit (INDEPENDENT_AMBULATORY_CARE_PROVIDER_SITE_OTHER): Payer: Medicare Other | Admitting: Podiatry

## 2020-07-10 ENCOUNTER — Other Ambulatory Visit: Payer: Self-pay

## 2020-07-10 DIAGNOSIS — M79676 Pain in unspecified toe(s): Secondary | ICD-10-CM

## 2020-07-10 DIAGNOSIS — B351 Tinea unguium: Secondary | ICD-10-CM

## 2020-07-10 DIAGNOSIS — E119 Type 2 diabetes mellitus without complications: Secondary | ICD-10-CM | POA: Diagnosis not present

## 2020-07-10 NOTE — Progress Notes (Signed)
This patient returns to my office for at risk foot care.  This patient requires this care by a professional since this patient will be at risk due to having type 1 diabetes.   This patient is unable to cut nails himself since the patient cannot reach his nails.These nails are painful walking and wearing shoes.  This patient presents for at risk foot care today.  General Appearance  Alert, conversant and in no acute stress.  Vascular  Dorsalis pedis and posterior tibial  pulses are palpable  bilaterally.  Capillary return is within normal limits  bilaterally. Temperature is within normal limits  bilaterally.  Neurologic  Senn-Weinstein monofilament wire test within normal limits  bilaterally. Muscle power within normal limits bilaterally.  Nails Thick disfigured discolored nails with subungual debris  from hallux to fifth toes bilaterally. No evidence of bacterial infection or drainage bilaterally.  Orthopedic  No limitations of motion  feet .  No crepitus or effusions noted.  No bony pathology or digital deformities noted.  Skin  normotropic skin with no porokeratosis noted bilaterally.  No signs of infections or ulcers noted.     Onychomycosis  Pain in right toes  Pain in left toes  Consent was obtained for treatment procedures.   Mechanical debridement of nails 1-5  bilaterally performed with a nail nipper.  Filed with dremel without incident.    Return office visit    12 weeks                  Told patient to return for periodic foot care and evaluation due to potential at risk complications.   Zylah Elsbernd DPM  

## 2020-09-14 ENCOUNTER — Other Ambulatory Visit: Payer: Self-pay

## 2020-09-15 ENCOUNTER — Ambulatory Visit (INDEPENDENT_AMBULATORY_CARE_PROVIDER_SITE_OTHER): Payer: Medicare Other | Admitting: Internal Medicine

## 2020-09-15 ENCOUNTER — Encounter: Payer: Self-pay | Admitting: Internal Medicine

## 2020-09-15 VITALS — BP 132/82 | HR 58 | Temp 98.6°F | Resp 18 | Ht 71.0 in | Wt 247.4 lb

## 2020-09-15 DIAGNOSIS — E538 Deficiency of other specified B group vitamins: Secondary | ICD-10-CM

## 2020-09-15 DIAGNOSIS — D649 Anemia, unspecified: Secondary | ICD-10-CM

## 2020-09-15 DIAGNOSIS — R5383 Other fatigue: Secondary | ICD-10-CM

## 2020-09-15 DIAGNOSIS — E559 Vitamin D deficiency, unspecified: Secondary | ICD-10-CM

## 2020-09-15 DIAGNOSIS — E109 Type 1 diabetes mellitus without complications: Secondary | ICD-10-CM

## 2020-09-15 LAB — CBC WITH DIFFERENTIAL/PLATELET
Basophils Absolute: 0 10*3/uL (ref 0.0–0.1)
Basophils Relative: 0.7 % (ref 0.0–3.0)
Eosinophils Absolute: 0.1 10*3/uL (ref 0.0–0.7)
Eosinophils Relative: 2.4 % (ref 0.0–5.0)
HCT: 30.9 % — ABNORMAL LOW (ref 39.0–52.0)
Hemoglobin: 9.9 g/dL — ABNORMAL LOW (ref 13.0–17.0)
Lymphocytes Relative: 35.6 % (ref 12.0–46.0)
Lymphs Abs: 2.2 10*3/uL (ref 0.7–4.0)
MCHC: 32 g/dL (ref 30.0–36.0)
MCV: 84.6 fl (ref 78.0–100.0)
Monocytes Absolute: 0.5 10*3/uL (ref 0.1–1.0)
Monocytes Relative: 7.9 % (ref 3.0–12.0)
Neutro Abs: 3.2 10*3/uL (ref 1.4–7.7)
Neutrophils Relative %: 53.4 % (ref 43.0–77.0)
Platelets: 251 10*3/uL (ref 150.0–400.0)
RBC: 3.65 Mil/uL — ABNORMAL LOW (ref 4.22–5.81)
RDW: 16.1 % — ABNORMAL HIGH (ref 11.5–15.5)
WBC: 6.1 10*3/uL (ref 4.0–10.5)

## 2020-09-15 LAB — FERRITIN: Ferritin: 10.8 ng/mL — ABNORMAL LOW (ref 22.0–322.0)

## 2020-09-15 LAB — VITAMIN B12: Vitamin B-12: 634 pg/mL (ref 211–911)

## 2020-09-15 LAB — TSH: TSH: 3.48 u[IU]/mL (ref 0.35–4.50)

## 2020-09-15 LAB — VITAMIN D 25 HYDROXY (VIT D DEFICIENCY, FRACTURES): VITD: 14.45 ng/mL — ABNORMAL LOW (ref 30.00–100.00)

## 2020-09-15 NOTE — Progress Notes (Signed)
   Subjective:   Patient ID: [name] [name], male    DOB: [birth year], 87 y.o.   MRN: [id number]  HPI The patient is an 87 YO man coming in for concerns about fatigue and weakness. Overall increasing over several years. Has had iron deficiency anemia which has not been treated. He is taking oral iron at times but gives GI upset. He has recently had some labs at the [place] which he brought results with him. Has been going there and not seen here since 2019. Denies chest pains or SOB. Denies diarrhea or constipation or blood in stool.   Review of Systems  Constitutional: Positive for activity change and fatigue.  HENT: Negative.   Eyes: Negative.   Respiratory: Negative for cough, chest tightness and shortness of breath.   Cardiovascular: Negative for chest pain, palpitations and leg swelling.  Gastrointestinal: Negative for abdominal distention, abdominal pain, constipation, diarrhea, nausea and vomiting.  Musculoskeletal: Negative.   Skin: Negative.   Neurological: Negative.   Psychiatric/Behavioral: Negative.     Objective:  Physical Exam Constitutional:      Appearance: He is well-developed. He is obese.  HENT:     Head: Normocephalic and atraumatic.  Cardiovascular:     Rate and Rhythm: Normal rate and regular rhythm.  Pulmonary:     Effort: Pulmonary effort is normal. No respiratory distress.     Breath sounds: Normal breath sounds. No wheezing or rales.  Abdominal:     General: Bowel sounds are normal. There is no distension.     Palpations: Abdomen is soft.     Tenderness: There is no abdominal tenderness. There is no rebound.  Musculoskeletal:     Cervical back: Normal range of motion.  Skin:    General: Skin is warm and dry.  Neurological:     Mental Status: He is alert and oriented to person, place, and time.     Coordination: Coordination abnormal.     Comments: Slow to stand     Vitals:   09/15/20 1354  BP: 132/82  Pulse: (!) 58  Resp: 18  Temp: 98.6 F (37  C)  TempSrc: Oral  SpO2: 98%  Weight: 247 lb 6.4 oz (112.2 kg)  Height: 5\' 11"  (1.803 m)    This visit occurred during the SARS-CoV-2 public health emergency.  Safety protocols were in place, including screening questions prior to the visit, additional usage of staff PPE, and extensive cleaning of exam room while observing appropriate contact time as indicated for disinfecting solutions.   Assessment & Plan:

## 2020-09-15 NOTE — Patient Instructions (Signed)
We are checking the labs today to see if the tiredness is caused by the low blood counts.

## 2020-09-18 ENCOUNTER — Encounter: Payer: Self-pay | Admitting: Internal Medicine

## 2020-09-18 DIAGNOSIS — D649 Anemia, unspecified: Secondary | ICD-10-CM | POA: Insufficient documentation

## 2020-09-18 DIAGNOSIS — R5383 Other fatigue: Secondary | ICD-10-CM | POA: Insufficient documentation

## 2020-09-18 NOTE — Assessment & Plan Note (Signed)
Checking B12 and vitamin D and TSH and CBC and ferritin to assess cause. He feels that this started after his booster for covid and it is unclear that this is related.

## 2020-09-18 NOTE — Assessment & Plan Note (Signed)
Checking CBC and ferritin and B12. He has had iron infusion in the past several years ago and tolerated well. Not able to tolerate oral iron well recently.

## 2020-09-18 NOTE — Assessment & Plan Note (Signed)
Recent labs from the [place] appear to have good control.

## 2020-09-21 ENCOUNTER — Other Ambulatory Visit: Payer: Self-pay | Admitting: Internal Medicine

## 2020-09-21 MED ORDER — VITAMIN D (ERGOCALCIFEROL) 1.25 MG (50000 UNIT) PO CAPS
50000.0000 [IU] | ORAL_CAPSULE | ORAL | 0 refills | Status: DC
Start: 2020-09-21 — End: 2021-08-16

## 2020-10-07 ENCOUNTER — Other Ambulatory Visit: Payer: Self-pay

## 2020-10-07 ENCOUNTER — Encounter: Payer: Self-pay | Admitting: Podiatry

## 2020-10-07 ENCOUNTER — Ambulatory Visit (INDEPENDENT_AMBULATORY_CARE_PROVIDER_SITE_OTHER): Payer: Medicare Other | Admitting: Podiatry

## 2020-10-07 DIAGNOSIS — M19012 Primary osteoarthritis, left shoulder: Secondary | ICD-10-CM | POA: Insufficient documentation

## 2020-10-07 DIAGNOSIS — Z860100 Personal history of colon polyps, unspecified: Secondary | ICD-10-CM | POA: Insufficient documentation

## 2020-10-07 DIAGNOSIS — IMO0002 Reserved for concepts with insufficient information to code with codable children: Secondary | ICD-10-CM | POA: Insufficient documentation

## 2020-10-07 DIAGNOSIS — D509 Iron deficiency anemia, unspecified: Secondary | ICD-10-CM | POA: Insufficient documentation

## 2020-10-07 DIAGNOSIS — E119 Type 2 diabetes mellitus without complications: Secondary | ICD-10-CM | POA: Diagnosis not present

## 2020-10-07 DIAGNOSIS — B351 Tinea unguium: Secondary | ICD-10-CM

## 2020-10-07 DIAGNOSIS — M79676 Pain in unspecified toe(s): Secondary | ICD-10-CM

## 2020-10-07 DIAGNOSIS — Z789 Other specified health status: Secondary | ICD-10-CM | POA: Insufficient documentation

## 2020-10-07 DIAGNOSIS — E8881 Metabolic syndrome: Secondary | ICD-10-CM | POA: Insufficient documentation

## 2020-10-07 DIAGNOSIS — H269 Unspecified cataract: Secondary | ICD-10-CM | POA: Insufficient documentation

## 2020-10-07 DIAGNOSIS — M545 Low back pain, unspecified: Secondary | ICD-10-CM | POA: Insufficient documentation

## 2020-10-07 DIAGNOSIS — M7540 Impingement syndrome of unspecified shoulder: Secondary | ICD-10-CM | POA: Insufficient documentation

## 2020-10-07 NOTE — Progress Notes (Signed)
This patient returns to my office for at risk foot care.  This patient requires this care by a professional since this patient will be at risk due to having type 1 diabetes.   This patient is unable to cut nails himself since the patient cannot reach his nails.These nails are painful walking and wearing shoes.  This patient presents for at risk foot care today.  General Appearance  Alert, conversant and in no acute stress.  Vascular  Dorsalis pedis and posterior tibial  pulses are palpable  bilaterally.  Capillary return is within normal limits  bilaterally. Temperature is within normal limits  bilaterally.  Neurologic  Senn-Weinstein monofilament wire test within normal limits  bilaterally. Muscle power within normal limits bilaterally.  Nails Thick disfigured discolored nails with subungual debris  from hallux to fifth toes bilaterally. No evidence of bacterial infection or drainage bilaterally.  Orthopedic  No limitations of motion  feet .  No crepitus or effusions noted.  No bony pathology or digital deformities noted.  Skin  normotropic skin with no porokeratosis noted bilaterally.  No signs of infections or ulcers noted.     Onychomycosis  Pain in right toes  Pain in left toes  Consent was obtained for treatment procedures.   Mechanical debridement of nails 1-5  bilaterally performed with a nail nipper.  Filed with dremel without incident.    Return office visit    12 weeks                  Told patient to return for periodic foot care and evaluation due to potential at risk complications.   Rahshawn Remo DPM  

## 2020-10-22 DIAGNOSIS — N5201 Erectile dysfunction due to arterial insufficiency: Secondary | ICD-10-CM | POA: Diagnosis not present

## 2020-10-22 DIAGNOSIS — Z8551 Personal history of malignant neoplasm of bladder: Secondary | ICD-10-CM | POA: Diagnosis not present

## 2020-10-22 DIAGNOSIS — N281 Cyst of kidney, acquired: Secondary | ICD-10-CM | POA: Diagnosis not present

## 2020-10-29 LAB — HM DIABETES EYE EXAM

## 2021-01-11 ENCOUNTER — Encounter: Payer: Self-pay | Admitting: Podiatry

## 2021-01-11 ENCOUNTER — Other Ambulatory Visit: Payer: Self-pay

## 2021-01-11 ENCOUNTER — Ambulatory Visit (INDEPENDENT_AMBULATORY_CARE_PROVIDER_SITE_OTHER): Payer: Medicare Other | Admitting: Podiatry

## 2021-01-11 DIAGNOSIS — M79676 Pain in unspecified toe(s): Secondary | ICD-10-CM

## 2021-01-11 DIAGNOSIS — B351 Tinea unguium: Secondary | ICD-10-CM | POA: Diagnosis not present

## 2021-01-11 DIAGNOSIS — E119 Type 2 diabetes mellitus without complications: Secondary | ICD-10-CM | POA: Diagnosis not present

## 2021-01-11 NOTE — Progress Notes (Signed)
This patient returns to my office for at risk foot care.  This patient requires this care by a professional since this patient will be at risk due to having type 1 diabetes.   This patient is unable to cut nails himself since the patient cannot reach his nails.These nails are painful walking and wearing shoes.  This patient presents for at risk foot care today.  General Appearance  Alert, conversant and in no acute stress.  Vascular  Dorsalis pedis and posterior tibial  pulses are palpable  bilaterally.  Capillary return is within normal limits  bilaterally. Temperature is within normal limits  bilaterally.  Neurologic  Senn-Weinstein monofilament wire test within normal limits  bilaterally. Muscle power within normal limits bilaterally.  Nails Thick disfigured discolored nails with subungual debris  from hallux to fifth toes bilaterally. No evidence of bacterial infection or drainage bilaterally.  Orthopedic  No limitations of motion  feet .  No crepitus or effusions noted.  No bony pathology or digital deformities noted.  Skin  normotropic skin with no porokeratosis noted bilaterally.  No signs of infections or ulcers noted.     Onychomycosis  Pain in right toes  Pain in left toes  Consent was obtained for treatment procedures.   Mechanical debridement of nails 1-5  bilaterally performed with a nail nipper.  Filed with dremel without incident.    Return office visit    12 weeks                  Told patient to return for periodic foot care and evaluation due to potential at risk complications.   Yeraldy Spike DPM  

## 2021-03-01 ENCOUNTER — Ambulatory Visit (INDEPENDENT_AMBULATORY_CARE_PROVIDER_SITE_OTHER): Payer: Medicare Other

## 2021-03-01 ENCOUNTER — Other Ambulatory Visit: Payer: Self-pay

## 2021-03-01 DIAGNOSIS — Z23 Encounter for immunization: Secondary | ICD-10-CM | POA: Diagnosis not present

## 2021-04-19 ENCOUNTER — Ambulatory Visit (INDEPENDENT_AMBULATORY_CARE_PROVIDER_SITE_OTHER): Payer: Medicare Other | Admitting: Podiatry

## 2021-04-19 ENCOUNTER — Other Ambulatory Visit: Payer: Self-pay

## 2021-04-19 ENCOUNTER — Encounter: Payer: Self-pay | Admitting: Podiatry

## 2021-04-19 DIAGNOSIS — M79676 Pain in unspecified toe(s): Secondary | ICD-10-CM

## 2021-04-19 DIAGNOSIS — E119 Type 2 diabetes mellitus without complications: Secondary | ICD-10-CM | POA: Diagnosis not present

## 2021-04-19 DIAGNOSIS — B351 Tinea unguium: Secondary | ICD-10-CM

## 2021-04-19 NOTE — Progress Notes (Signed)
This patient returns to my office for at risk foot care.  This patient requires this care by a professional since this patient will be at risk due to having type 1 diabetes.   This patient is unable to cut nails himself since the patient cannot reach his nails.These nails are painful walking and wearing shoes.  This patient presents for at risk foot care today.  General Appearance  Alert, conversant and in no acute stress.  Vascular  Dorsalis pedis and posterior tibial  pulses are palpable  bilaterally.  Capillary return is within normal limits  bilaterally. Temperature is within normal limits  bilaterally.  Neurologic  Senn-Weinstein monofilament wire test within normal limits  bilaterally. Muscle power within normal limits bilaterally.  Nails Thick disfigured discolored nails with subungual debris  from hallux to fifth toes bilaterally. No evidence of bacterial infection or drainage bilaterally.  Orthopedic  No limitations of motion  feet .  No crepitus or effusions noted.  No bony pathology or digital deformities noted.  Skin  normotropic skin with no porokeratosis noted bilaterally.  No signs of infections or ulcers noted.     Onychomycosis  Pain in right toes  Pain in left toes  Consent was obtained for treatment procedures.   Mechanical debridement of nails 1-5  bilaterally performed with a nail nipper.  Filed with dremel without incident.    Return office visit    12 weeks                  Told patient to return for periodic foot care and evaluation due to potential at risk complications.   Alejo Beamer DPM  

## 2021-05-12 ENCOUNTER — Encounter: Payer: Self-pay | Admitting: Gastroenterology

## 2021-06-18 LAB — HEMOGLOBIN A1C: Hemoglobin A1C: 7.7

## 2021-07-19 ENCOUNTER — Ambulatory Visit (INDEPENDENT_AMBULATORY_CARE_PROVIDER_SITE_OTHER): Payer: Medicare Other | Admitting: Podiatry

## 2021-07-19 ENCOUNTER — Encounter: Payer: Self-pay | Admitting: Podiatry

## 2021-07-19 ENCOUNTER — Other Ambulatory Visit: Payer: Self-pay

## 2021-07-19 DIAGNOSIS — M79676 Pain in unspecified toe(s): Secondary | ICD-10-CM | POA: Diagnosis not present

## 2021-07-19 DIAGNOSIS — E119 Type 2 diabetes mellitus without complications: Secondary | ICD-10-CM | POA: Diagnosis not present

## 2021-07-19 DIAGNOSIS — B351 Tinea unguium: Secondary | ICD-10-CM | POA: Diagnosis not present

## 2021-07-19 NOTE — Progress Notes (Signed)
This patient returns to my office for at risk foot care.  This patient requires this care by a professional since this patient will be at risk due to having type 1 diabetes.   This patient is unable to cut nails himself since the patient cannot reach his nails.These nails are painful walking and wearing shoes.  This patient presents for at risk foot care today.  General Appearance  Alert, conversant and in no acute stress.  Vascular  Dorsalis pedis and posterior tibial  pulses are palpable  bilaterally.  Capillary return is within normal limits  bilaterally. Temperature is within normal limits  bilaterally.  Neurologic  Senn-Weinstein monofilament wire test within normal limits  bilaterally. Muscle power within normal limits bilaterally.  Nails Thick disfigured discolored nails with subungual debris  from hallux to fifth toes bilaterally. No evidence of bacterial infection or drainage bilaterally.  Orthopedic  No limitations of motion  feet .  No crepitus or effusions noted.  No bony pathology or digital deformities noted.  Skin  normotropic skin with no porokeratosis noted bilaterally.  No signs of infections or ulcers noted.     Onychomycosis  Pain in right toes  Pain in left toes  Consent was obtained for treatment procedures.   Mechanical debridement of nails 1-5  bilaterally performed with a nail nipper.  Filed with dremel without incident.    Return office visit    12 weeks                  Told patient to return for periodic foot care and evaluation due to potential at risk complications.   Shalunda Lindh DPM  

## 2021-08-06 ENCOUNTER — Ambulatory Visit (INDEPENDENT_AMBULATORY_CARE_PROVIDER_SITE_OTHER): Payer: Medicare Other | Admitting: Nurse Practitioner

## 2021-08-06 ENCOUNTER — Encounter: Payer: Self-pay | Admitting: Nurse Practitioner

## 2021-08-06 ENCOUNTER — Other Ambulatory Visit: Payer: Self-pay

## 2021-08-06 VITALS — BP 130/70 | HR 65 | Temp 98.5°F | Ht 71.0 in | Wt 250.4 lb

## 2021-08-06 DIAGNOSIS — R21 Rash and other nonspecific skin eruption: Secondary | ICD-10-CM | POA: Diagnosis not present

## 2021-08-06 MED ORDER — MUPIROCIN 2 % EX OINT: 1.0000 "application " | TOPICAL_OINTMENT | Freq: Two times a day (BID) | CUTANEOUS | 0 refills | Status: DC

## 2021-08-06 NOTE — Assessment & Plan Note (Addendum)
Localized to left lower extremity.  Evaluation has been completed by dermatology.  Etiology still unclear.  He has ketoconazole cream which he was prescribed for a different rash in his groin by dermatology.  We did discuss possibly applying the ketoconazole cream to the area (the hypopigmented lesions do look like possible tinea versicolor), but patient is reluctant to do so.  I do not see active signs of infection today, however he will apply mupirocin cream to prevent infection.  He was encouraged to wash the area with mild nonfragrant soap and water twice a day and to keep the area dry.  He will follow-up next week for close monitoring. ?

## 2021-08-06 NOTE — Progress Notes (Signed)
? ? ? ?Subjective:  ?Patient ID: [name] [name], male    DOB: June 19, 1933  Age: 88 y.o. MRN: [id number] ? ?CC:  ?Chief Complaint  ?Patient presents with  ? Rash  ?  Rash or sore on left lower leg  ?  ? ? ?HPI  ?This patient arrives today for the above. ? ?He reports he has had discoloration to his left lower leg for "years".  Initially started with 1 round hypopigmented spot, but has begun to spread up his left lower extremity.  He tells me around 5 days ago his lower extremities seem to be more "inflamed".  He denies pain, itching, fever, warmth to touch, or drainage.  He has been evaluated by dermatology and was told etiology is unknown.  He has recently started ferrous sulfate supplementation for treatment of anemia via [place].  Otherwise he is not made any medication changes.  He has applied triple antibiotic ointment to the area.  ? ?Past Medical History:  ?Diagnosis Date  ? Adenoma of left adrenal gland   ? STABLE PER UROLOGIST NOTE DR ESKRIDGE  ? Anemia   ? Anxiety   ? Bilateral dry eyes   ? Blood transfusion without reported diagnosis   ? Cataract   ? Generalized OA   ? GERD (gastroesophageal reflux disease)   ? History of bladder cancer   ? S/P RADICAL CYSTECTOMY W/ IDEAL LOOP URINARY DIVERSION  2000  ? Hyperlipidemia   ? Hypertension   ? S/P ileostomy ([place])   ? URINARY ILEAL  ? Type 2 diabetes mellitus ([place])   ? Urothelial cancer ([place])   ? Urothelial carcinoma ([place])   ? penile urethra  ? ? ? ? ?Family History  ?Problem Relation Age of Onset  ? Diabetes Mother   ? Colon cancer Neg Hx   ? Esophageal cancer Neg Hx   ? Rectal cancer Neg Hx   ? Stomach cancer Neg Hx   ? ? ?Social History  ? ?Social History Narrative  ?  He has a grandson.  ? Lives alone. [place], but doesn't make long trips.  ? ?Social History  ? ?Tobacco Use  ? Smoking status: Former  ?  Packs/day: 1.50  ?  Years: 20.00  ?  Pack years: 30.00  ?  Types: Cigarettes  ?  Quit date: 01/19/1976  ?  Years since quitting: 45.5  ? Smokeless tobacco: Never   ?Substance Use Topics  ? Alcohol use: Yes  ?  Comment: OCCASIONAL  ? ? ? ?Current Meds  ?Medication Sig  ? acetaminophen (TYLENOL) 325 MG tablet Take 325-650 mg by mouth as needed for fever or headache.  ? amLODipine (NORVASC) 2.5 MG tablet Take 2.5 mg by mouth every morning.  ? ascorbic acid (VITAMIN C) 250 MG tablet TAKE ONE TABLET BY MOUTH MONDAY,WEDNESDAY,FRIDAY FOR INADEQUATE VITAMIN C  ? aspirin 325 MG tablet Take 1 tablet (325 mg total) by mouth daily.  ? atenolol (TENORMIN) 25 MG tablet Take 25 mg by mouth every morning.  ? Cholecalciferol 25 MCG (1000 UT) tablet Take 3 tablets by mouth daily.  ? ferrous sulfate 220 (44 Fe) MG/5ML solution TAKE 1 TEASPOONFUL BY MOUTH MONDAY,WEDNESDAY,FRIDAY FOR IRON REPLACEMENT  ? insulin glargine (LANTUS) 100 UNIT/ML injection Inject 45 Units into the skin at bedtime.  ? insulin regular (NOVOLIN R) 100 units/mL injection Inject 5 Units into the skin 3 (three) times daily before meals. Sliding scale as needed  ? ketoconazole (NIZORAL) 2 % cream Apply 1 application topically 2 (two)  times daily.  ? ketotifen (ZADITOR) 0.025 % ophthalmic solution Place 1 drop into both eyes 2 (two) times daily as needed (irritation).  ? losartan (COZAAR) 50 MG tablet Take 50 mg by mouth 2 (two) times daily.  ? magnesium hydroxide (MILK OF MAGNESIA) 400 MG/5ML suspension Take 30 mLs by mouth daily as needed for moderate constipation.  ? metFORMIN (GLUCOPHAGE) 1000 MG tablet Take 500 mg by mouth 2 (two) times daily with a meal.  ? montelukast (SINGULAIR) 10 MG tablet Take 1 tablet (10 mg total) by mouth at bedtime.  ? mupirocin ointment (BACTROBAN) 2 % Apply 1 application. topically 2 (two) times daily.  ? nystatin ointment (MYCOSTATIN) Apply 1 application topically 2 (two) times daily.  ? omeprazole (PRILOSEC) 20 MG capsule Take 40 mg by mouth 2 (two) times daily before a meal.  ? polyvinyl alcohol (LIQUIFILM TEARS) 1.4 % ophthalmic solution Place 1 drop into both eyes 3 (three) times daily.    ? ranitidine (ZANTAC) 150 MG tablet TAKE 1 TABLET (150 MG TOTAL) BY MOUTH AT BEDTIME.  ? simvastatin (ZOCOR) 40 MG tablet Take 20 mg by mouth every evening.  ? ? ?ROS:  ?Review of Systems  ?Constitutional:  Negative for chills and fever.  ?Skin:  Positive for rash. Negative for itching.  ? ? ?Objective:  ? ?Today's Vitals: BP 130/70   Pulse 65   Temp 98.5 ?F (36.9 ?C) (Oral)   Ht '5\' 11"'$  (1.803 m)   Wt 250 lb 6.4 oz (113.6 kg)   SpO2 98%   BMI 34.92 kg/m?  ? ?  08/06/2021  ?  1:49 PM 09/15/2020  ?  1:54 PM 11/07/2019  ?  6:25 PM  ?Vitals with BMI  ?Height '5\' 11"'$  '5\' 11"'$    ?Weight 250 lbs 6 oz 247 lbs 6 oz   ?BMI 34.94 34.52   ?Systolic 157 262 035  ?Diastolic 70 82 93  ?Pulse 65 58 58  ?  ? ?Physical Exam ?Vitals reviewed.  ?Constitutional:   ?   Appearance: Normal appearance.  ?HENT:  ?   Head: Normocephalic and atraumatic.  ?Cardiovascular:  ?   Rate and Rhythm: Normal rate and regular rhythm.  ?Pulmonary:  ?   Effort: Pulmonary effort is normal.  ?   Breath sounds: Normal breath sounds.  ?Musculoskeletal:  ?   Cervical back: Neck supple.  ?Skin: ?   General: Skin is warm and dry.  ? ?    ?Neurological:  ?   Mental Status: He is alert and oriented to person, place, and time.  ?Psychiatric:     ?   Mood and Affect: Mood normal.     ?   Behavior: Behavior normal.     ?   Thought Content: Thought content normal.     ?   Judgment: Judgment normal.  ? ? ? ? ? ? ? ?Assessment and Plan  ? ?1. Rash   ? ? ? ?Plan: ?See plan via problem list below. ? ? ? ?Tests ordered ?No orders of the defined types were placed in this encounter. ? ? ? ? ?Meds ordered this encounter  ?Medications  ? mupirocin ointment (BACTROBAN) 2 %  ?  Sig: Apply 1 application. topically 2 (two) times daily.  ?  Dispense:  22 g  ?  Refill:  0  ?  Order Specific Question:   Supervising Provider  ?  Answer:   Binnie Rail [[id number]]  ? ? ?Patient to follow-up in 1 week or sooner  as needed ? ?Ailene Ards, NP ? ?

## 2021-08-10 ENCOUNTER — Encounter: Payer: Self-pay | Admitting: Internal Medicine

## 2021-08-10 ENCOUNTER — Ambulatory Visit (INDEPENDENT_AMBULATORY_CARE_PROVIDER_SITE_OTHER): Payer: Medicare Other | Admitting: Internal Medicine

## 2021-08-10 DIAGNOSIS — R21 Rash and other nonspecific skin eruption: Secondary | ICD-10-CM | POA: Diagnosis not present

## 2021-08-10 DIAGNOSIS — R5383 Other fatigue: Secondary | ICD-10-CM

## 2021-08-10 NOTE — Patient Instructions (Addendum)
It is okay to use the creams and let us know if not improving. ? ?

## 2021-08-10 NOTE — Progress Notes (Signed)
? ?  Subjective:  ? ?Patient ID: [name] [name], male    DOB: Sep 16, 1933, 88 y.o.   MRN: [id number] ? ?HPI ?The patient is an 88 YO man coming in for feeling poorly. Rash on leg has several creams from dermatology he wants to know if he should use. ? ?Review of Systems  ?Constitutional:  Positive for fatigue.  ?HENT: Negative.    ?Eyes: Negative.   ?Respiratory:  Negative for cough, chest tightness and shortness of breath.   ?Cardiovascular:  Negative for chest pain, palpitations and leg swelling.  ?Gastrointestinal:  Negative for abdominal distention, abdominal pain, constipation, diarrhea, nausea and vomiting.  ?Musculoskeletal:  Positive for arthralgias.  ?Skin:  Positive for rash.  ?Neurological: Negative.   ?Psychiatric/Behavioral: Negative.    ? ?Objective:  ?Physical Exam ?Constitutional:   ?   Appearance: He is well-developed.  ?   Comments: Chronically ill appearing  ?HENT:  ?   Head: Normocephalic and atraumatic.  ?Cardiovascular:  ?   Rate and Rhythm: Normal rate and regular rhythm.  ?Pulmonary:  ?   Effort: Pulmonary effort is normal. No respiratory distress.  ?   Breath sounds: Normal breath sounds. No wheezing or rales.  ?Abdominal:  ?   General: Bowel sounds are normal. There is no distension.  ?   Palpations: Abdomen is soft.  ?   Tenderness: There is no abdominal tenderness. There is no rebound.  ?Musculoskeletal:  ?   Cervical back: Normal range of motion.  ?Skin: ?   General: Skin is warm and dry.  ?   Findings: Rash present.  ?Neurological:  ?   Mental Status: He is alert and oriented to person, place, and time.  ?   Coordination: Coordination normal.  ? ? ?Vitals:  ? 08/10/21 1000  ?BP: 124/76  ?Pulse: (!) 59  ?Resp: 18  ?SpO2: 98%  ?Weight: 249 lb 12.8 oz (113.3 kg)  ?Height: '5\' 11"'$  (1.803 m)  ? ? ?This visit occurred during the SARS-CoV-2 public health emergency.  Safety protocols were in place, including screening questions prior to the visit, additional usage of staff PPE, and extensive  cleaning of exam room while observing appropriate contact time as indicated for disinfecting solutions.  ? ?Assessment & Plan:  ?Visit time 20 minutes in face to face communication with patient and coordination of care, additional 5 minutes spent in record review, coordination or care, ordering tests, communicating/referring to other healthcare professionals, documenting in medical records all on the same day of the visit for total time 25 minutes spent on the visit.  ? ?

## 2021-08-12 ENCOUNTER — Emergency Department (HOSPITAL_COMMUNITY): Payer: No Typology Code available for payment source

## 2021-08-12 ENCOUNTER — Telehealth: Payer: Self-pay

## 2021-08-12 ENCOUNTER — Encounter (HOSPITAL_COMMUNITY): Payer: Self-pay

## 2021-08-12 ENCOUNTER — Emergency Department (HOSPITAL_COMMUNITY)
Admission: EM | Admit: 2021-08-12 | Discharge: 2021-08-12 | Disposition: A | Discharge status: Home / Self Care | Payer: No Typology Code available for payment source | Attending: Emergency Medicine | Admitting: Emergency Medicine

## 2021-08-12 DIAGNOSIS — R509 Fever, unspecified: Secondary | ICD-10-CM | POA: Diagnosis not present

## 2021-08-12 DIAGNOSIS — I10 Essential (primary) hypertension: Secondary | ICD-10-CM | POA: Diagnosis not present

## 2021-08-12 DIAGNOSIS — R110 Nausea: Secondary | ICD-10-CM | POA: Diagnosis not present

## 2021-08-12 DIAGNOSIS — Z20822 Contact with and (suspected) exposure to covid-19: Secondary | ICD-10-CM | POA: Insufficient documentation

## 2021-08-12 DIAGNOSIS — R21 Rash and other nonspecific skin eruption: Secondary | ICD-10-CM | POA: Diagnosis not present

## 2021-08-12 DIAGNOSIS — I700 Atherosclerosis of aorta: Secondary | ICD-10-CM | POA: Insufficient documentation

## 2021-08-12 DIAGNOSIS — Z7982 Long term (current) use of aspirin: Secondary | ICD-10-CM | POA: Insufficient documentation

## 2021-08-12 DIAGNOSIS — Z794 Long term (current) use of insulin: Secondary | ICD-10-CM | POA: Insufficient documentation

## 2021-08-12 DIAGNOSIS — R42 Dizziness and giddiness: Secondary | ICD-10-CM | POA: Diagnosis not present

## 2021-08-12 DIAGNOSIS — R531 Weakness: Secondary | ICD-10-CM | POA: Diagnosis not present

## 2021-08-12 LAB — CBC WITH DIFFERENTIAL/PLATELET
Abs Immature Granulocytes: 0.06 10*3/uL (ref 0.00–0.07)
Basophils Absolute: 0 10*3/uL (ref 0.0–0.1)
Basophils Relative: 0 %
Eosinophils Absolute: 0 10*3/uL (ref 0.0–0.5)
Eosinophils Relative: 0 %
HCT: 29.6 % — ABNORMAL LOW (ref 39.0–52.0)
Hemoglobin: 8.8 g/dL — ABNORMAL LOW (ref 13.0–17.0)
Immature Granulocytes: 1 %
Lymphocytes Relative: 4 %
Lymphs Abs: 0.4 10*3/uL — ABNORMAL LOW (ref 0.7–4.0)
MCH: 26.1 pg (ref 26.0–34.0)
MCHC: 29.7 g/dL — ABNORMAL LOW (ref 30.0–36.0)
MCV: 87.8 fL (ref 80.0–100.0)
Monocytes Absolute: 0.2 10*3/uL (ref 0.1–1.0)
Monocytes Relative: 2 %
Neutro Abs: 10.7 10*3/uL — ABNORMAL HIGH (ref 1.7–7.7)
Neutrophils Relative %: 93 %
Platelets: 236 10*3/uL (ref 150–400)
RBC: 3.37 MIL/uL — ABNORMAL LOW (ref 4.22–5.81)
RDW: 16.2 % — ABNORMAL HIGH (ref 11.5–15.5)
WBC: 11.4 10*3/uL — ABNORMAL HIGH (ref 4.0–10.5)
nRBC: 0 % (ref 0.0–0.2)

## 2021-08-12 LAB — URINALYSIS, ROUTINE W REFLEX MICROSCOPIC
Bilirubin Urine: NEGATIVE
Glucose, UA: NEGATIVE mg/dL
Ketones, ur: NEGATIVE mg/dL
Nitrite: NEGATIVE
Protein, ur: 100 mg/dL — AB
Specific Gravity, Urine: 1.011 (ref 1.005–1.030)
pH: 6 (ref 5.0–8.0)

## 2021-08-12 LAB — COMPREHENSIVE METABOLIC PANEL
ALT: 15 U/L (ref 0–44)
AST: 20 U/L (ref 15–41)
Albumin: 3.5 g/dL (ref 3.5–5.0)
Alkaline Phosphatase: 78 U/L (ref 38–126)
Anion gap: 9 (ref 5–15)
BUN: 23 mg/dL (ref 8–23)
CO2: 20 mmol/L — ABNORMAL LOW (ref 22–32)
Calcium: 8.9 mg/dL (ref 8.9–10.3)
Chloride: 107 mmol/L (ref 98–111)
Creatinine, Ser: 1.58 mg/dL — ABNORMAL HIGH (ref 0.61–1.24)
GFR, Estimated: 42 mL/min — ABNORMAL LOW (ref >60)
Glucose, Bld: 141 mg/dL — ABNORMAL HIGH (ref 70–99)
Potassium: 3.7 mmol/L (ref 3.5–5.1)
Sodium: 136 mmol/L (ref 135–145)
Total Bilirubin: 0.8 mg/dL (ref 0.3–1.2)
Total Protein: 7.5 g/dL (ref 6.5–8.1)

## 2021-08-12 LAB — RESP PANEL BY RT-PCR (FLU A&B, COVID) ARPGX2
Influenza A by PCR: NEGATIVE
Influenza B by PCR: NEGATIVE
SARS Coronavirus 2 by RT PCR: NEGATIVE

## 2021-08-12 LAB — LACTIC ACID, PLASMA
Lactic Acid, Venous: 2.6 mmol/L (ref 0.5–1.9)
Lactic Acid, Venous: 2 mmol/L (ref 0.5–1.9)

## 2021-08-12 LAB — ACETAMINOPHEN LEVEL: Acetaminophen (Tylenol), Serum: 13 ug/mL (ref 10–30)

## 2021-08-12 LAB — PROTIME-INR
INR: 1.1 (ref 0.8–1.2)
Prothrombin Time: 13.8 seconds (ref 11.4–15.2)

## 2021-08-12 MED ORDER — SODIUM CHLORIDE (PF) 0.9 % IJ SOLN
INTRAMUSCULAR | Status: AC
  Filled 2021-08-12: qty 50

## 2021-08-12 MED ORDER — IOHEXOL 300 MG/ML  SOLN
80.0000 mL | Freq: Once | INTRAMUSCULAR | Status: AC | PRN
  Administered 2021-08-12: 80 mL via INTRAVENOUS

## 2021-08-12 MED ORDER — SODIUM CHLORIDE 0.9 % IV BOLUS
1000.0000 mL | Freq: Once | INTRAVENOUS | Status: AC
  Administered 2021-08-12: 1000 mL via INTRAVENOUS

## 2021-08-12 NOTE — Discharge Instructions (Signed)
I did not find a bacterial source for your fever.  It does not mean that there is nothing wrong.  Virus can cause similar symptoms.  Please return for worsening symptoms inability to eat or drink. ?

## 2021-08-12 NOTE — ED Provider Notes (Signed)
?[facility] DEPT ?Provider Note ? ? ?CSN: [id number] ?Arrival date & time: 08/12/21  2703 ? ?  ? ?History ? ?Chief Complaint  ?Patient presents with  ? Fever  ? ? ?[name] [name] is a 88 y.o. male. ? ?88 yo M with a chief complaint of fever.  Going on for a couple days.  He has been feeling unwell for about a week.  He thought it was due to iron supplementation.  This was just started at the [place].  He has seen his doctor twice over the past week.  He also developed a worsening rash to the left lower extremity.  Has had an area there that is been pretty stable but he felt like it starting to spread.  He had seen a dermatologist and was started on the medication topically for the area.  He tells me it took 3 doses of Tylenol over a short period of time without improvement. ? ? ?Fever ? ?  ? ?Home Medications ?Prior to Admission medications   ?Medication Sig Start Date End Date Taking? Authorizing Provider  ?acetaminophen (TYLENOL) 325 MG tablet Take 325-650 mg by mouth as needed for fever or headache.    [provider]  ?amLODipine (NORVASC) 2.5 MG tablet Take 2.5 mg by mouth every morning.    [provider]  ?ascorbic acid (VITAMIN C) 250 MG tablet TAKE ONE TABLET BY MOUTH MONDAY,WEDNESDAY,FRIDAY FOR INADEQUATE VITAMIN C 07/14/21   [provider]  ?aspirin 325 MG tablet Take 1 tablet (325 mg total) by mouth daily. 01/24/13   Festus Aloe, MD  ?atenolol (TENORMIN) 25 MG tablet Take 25 mg by mouth every morning.    [provider]  ?Cholecalciferol 25 MCG (1000 UT) tablet Take 3 tablets by mouth daily. 06/22/21   [provider]  ?ferrous sulfate 220 (44 Fe) MG/5ML solution TAKE 1 TEASPOONFUL BY MOUTH MONDAY,WEDNESDAY,FRIDAY FOR IRON REPLACEMENT 07/14/21   [provider]  ?insulin glargine-yfgn (SEMGLEE) 100 UNIT/ML injection Inject 38 Units into the skin daily. 06/18/21   [provider]  ?insulin regular (NOVOLIN R) 100  units/mL injection Inject 5 Units into the skin 3 (three) times daily before meals. Sliding scale as needed    [provider]  ?ketoconazole (NIZORAL) 2 % cream Apply 1 application topically 2 (two) times daily. 07/05/18   Hoyt Koch, MD  ?ketotifen (ZADITOR) 0.025 % ophthalmic solution Place 1 drop into both eyes 2 (two) times daily as needed (irritation).    [provider]  ?losartan (COZAAR) 50 MG tablet Take 50 mg by mouth 2 (two) times daily.    [provider]  ?magnesium hydroxide (MILK OF MAGNESIA) 400 MG/5ML suspension Take 30 mLs by mouth daily as needed for moderate constipation.    [provider]  ?metFORMIN (GLUCOPHAGE) 1000 MG tablet Take 500 mg by mouth 2 (two) times daily with a meal.    [provider]  ?montelukast (SINGULAIR) 10 MG tablet Take 1 tablet (10 mg total) by mouth at bedtime. 02/10/17   Hoyt Koch, MD  ?mupirocin ointment (BACTROBAN) 2 % Apply 1 application. topically 2 (two) times daily. 08/06/21   Ailene Ards, NP  ?nystatin ointment (MYCOSTATIN) Apply 1 application topically 2 (two) times daily. 04/17/18   Hoyt Koch, MD  ?omeprazole (PRILOSEC) 20 MG capsule Take 40 mg by mouth 2 (two) times daily before a meal.    [provider]  ?polyvinyl alcohol (LIQUIFILM TEARS) 1.4 % ophthalmic solution Place  1 drop into both eyes 3 (three) times daily.     [provider]  ?ranitidine (ZANTAC) 150 MG tablet TAKE 1 TABLET (150 MG TOTAL) BY MOUTH AT BEDTIME. 06/04/18   Hoyt Koch, MD  ?simvastatin (ZOCOR) 40 MG tablet Take 20 mg by mouth every evening.    [provider]  ?Vitamin D, Ergocalciferol, (DRISDOL) 1.25 MG (50000 UNIT) CAPS capsule Take 1 capsule (50,000 Units total) by mouth every 7 (seven) days. 09/21/20   Hoyt Koch, MD  ?   ? ?Allergies    ?Ferrous sulfate, Lisinopril, and Glipizide   ? ?Review of Systems   ?Review of Systems  ?Constitutional:  Positive for  fever.  ? ?Physical Exam ?Updated Vital Signs ?BP (!) 121/58   Pulse 63   Temp 99.8 ?F (37.7 ?C) (Oral)   Resp (!) 25   Ht '5\' 11"'$  (1.803 m)   Wt 113.3 kg   SpO2 100%   BMI 34.84 kg/m?  ?Physical Exam ?Vitals and nursing note reviewed.  ?Constitutional:   ?   Appearance: He is well-developed.  ?HENT:  ?   Head: Normocephalic and atraumatic.  ?   Nose:  ?   Comments: Swollen turbinates ?Eyes:  ?   Pupils: Pupils are equal, round, and reactive to light.  ?Neck:  ?   Vascular: No JVD.  ?Cardiovascular:  ?   Rate and Rhythm: Normal rate and regular rhythm.  ?   Heart sounds: No murmur heard. ?  No friction rub. No gallop.  ?Pulmonary:  ?   Effort: No respiratory distress.  ?   Breath sounds: No wheezing.  ?Abdominal:  ?   General: There is no distension.  ?   Tenderness: There is no abdominal tenderness. There is no guarding or rebound.  ?   Comments: Urostomy site without obvious pain erythema fluctuance  ?Musculoskeletal:     ?   General: Normal range of motion.  ?   Cervical back: Normal range of motion and neck supple.  ?   Comments: Some hypopigmented areas to the left lower extremity.  Some scattered areas of erythema.  No fluctuance no induration  ?Skin: ?   Coloration: Skin is not pale.  ?   Findings: No rash.  ?Neurological:  ?   Mental Status: He is alert and oriented to person, place, and time.  ?Psychiatric:     ?   Behavior: Behavior normal.  ? ? ?ED Results / Procedures / Treatments   ?Labs ?(all labs ordered are listed, but only abnormal results are displayed) ?Labs Reviewed  ?COMPREHENSIVE METABOLIC PANEL - Abnormal; Notable for the following components:  ?    Result Value  ? CO2 20 (*)   ? Glucose, Bld 141 (*)   ? Creatinine, Ser 1.58 (*)   ? GFR, Estimated 42 (*)   ? All other components within normal limits  ?LACTIC ACID, PLASMA - Abnormal; Notable for the following components:  ? Lactic Acid, Venous 2.6 (*)   ? All other components within normal limits  ?LACTIC ACID, PLASMA - Abnormal; Notable  for the following components:  ? Lactic Acid, Venous 2.0 (*)   ? All other components within normal limits  ?CBC WITH DIFFERENTIAL/PLATELET - Abnormal; Notable for the following components:  ? WBC 11.4 (*)   ? RBC 3.37 (*)   ? Hemoglobin 8.8 (*)   ? HCT 29.6 (*)   ? MCHC 29.7 (*)   ? RDW 16.2 (*)   ? Neutro  Abs 10.7 (*)   ? Lymphs Abs 0.4 (*)   ? All other components within normal limits  ?URINALYSIS, ROUTINE W REFLEX MICROSCOPIC - Abnormal; Notable for the following components:  ? APPearance HAZY (*)   ? Hgb urine dipstick SMALL (*)   ? Protein, ur 100 (*)   ? Leukocytes,Ua MODERATE (*)   ? Bacteria, UA FEW (*)   ? All other components within normal limits  ?RESP PANEL BY RT-PCR (FLU A&B, COVID) ARPGX2  ?CULTURE, BLOOD (ROUTINE X 2)  ?CULTURE, BLOOD (ROUTINE X 2)  ?PROTIME-INR  ?ACETAMINOPHEN LEVEL  ? ? ?EKG ?EKG Interpretation ? ?Date/Time:  Thursday August 12 2021 08:11:24 EDT ?Ventricular Rate:  65 ?PR Interval:  239 ?QRS Duration: 85 ?QT Interval:  398 ?QTC Calculation: 414 ?R Axis:   65 ?Text Interpretation: Sinus rhythm Atrial premature complex Prolonged PR interval Borderline repolarization abnormality No significant change since last tracing Confirmed by Deno Etienne [phone]) on 08/12/2021 9:18:16 AM ? ?Radiology ?DG Chest 2 View ? ?Result Date: 08/12/2021 ?CLINICAL DATA:  Suspected Sepsis EXAM: CHEST - 2 VIEW COMPARISON:  Chest radiograph Sep 25, 2014. FINDINGS: No consolidation. No visible pleural effusions or pneumothorax. Cardiomediastinal silhouette is mildly enlarged, similar. No evidence of acute osseous abnormality. IMPRESSION: No evidence of acute cardiopulmonary disease. Electronically Signed   By: Margaretha Sheffield M.D.   On: 08/12/2021 09:09  ? ?CT ABDOMEN PELVIS W CONTRAST ? ?Result Date: 08/12/2021 ?CLINICAL DATA:  88 year old male with abdominal pain and fever of 102 F. left lower extremity infection. History of bladder cancer. EXAM: CT ABDOMEN AND PELVIS WITH CONTRAST TECHNIQUE: Multidetector CT  imaging of the abdomen and pelvis was performed using the standard protocol following bolus administration of intravenous contrast. RADIATION DOSE REDUCTION: This exam was performed according to the departmental do

## 2021-08-12 NOTE — ED Triage Notes (Signed)
Pt BIBA from home for fever of 102 and general malaise starting last night. Took 3 doses of '650mg'$  tylenol but meds noted to be expired. Ambulatory to bed. Being treated topically for LLE infection. Has RLQ ostomy. ? ?BP 200/70 to 148/77 ?HR 80 ?98% RA ?CBG 145 ? ? ?

## 2021-08-12 NOTE — Telephone Encounter (Signed)
[name] [name] is calling reporting fever and chills. Pt is states that the sxs started about 2 am this morning.  Pt says fever was 101.8 about 3am. Currently fever is 99.8. Pt states that he just don't feel good.  ? ?Pt went to the ER this morning. He says after running all these tests they didn't find anything and wouldn't give him any medication to help with his sxs. Pt is requesting to get something to help him feel better. ? ?Please advise ?

## 2021-08-12 NOTE — Telephone Encounter (Signed)
This sounds most likely like it was felt he has a viral illness if he was not tx with antibiotic.  Ok for tylenol as needed, rest and fluids but that is about the best we can otherwise do    thanks ?

## 2021-08-13 ENCOUNTER — Other Ambulatory Visit: Payer: Self-pay

## 2021-08-13 ENCOUNTER — Inpatient Hospital Stay (HOSPITAL_COMMUNITY): Payer: No Typology Code available for payment source

## 2021-08-13 ENCOUNTER — Encounter (HOSPITAL_COMMUNITY): Payer: Self-pay

## 2021-08-13 ENCOUNTER — Emergency Department (HOSPITAL_COMMUNITY): Payer: No Typology Code available for payment source

## 2021-08-13 ENCOUNTER — Inpatient Hospital Stay (HOSPITAL_COMMUNITY)
Admission: EM | Admit: 2021-08-13 | Discharge: 2021-08-16 | DRG: 872 | Disposition: A | Discharge status: Home / Self Care | Payer: No Typology Code available for payment source | Attending: Family Medicine | Admitting: Family Medicine

## 2021-08-13 ENCOUNTER — Telehealth (HOSPITAL_BASED_OUTPATIENT_CLINIC_OR_DEPARTMENT_OTHER): Payer: Self-pay | Admitting: *Deleted

## 2021-08-13 DIAGNOSIS — Z87891 Personal history of nicotine dependence: Secondary | ICD-10-CM

## 2021-08-13 DIAGNOSIS — Z66 Do not resuscitate: Secondary | ICD-10-CM | POA: Diagnosis present

## 2021-08-13 DIAGNOSIS — M159 Polyosteoarthritis, unspecified: Secondary | ICD-10-CM | POA: Diagnosis present

## 2021-08-13 DIAGNOSIS — E785 Hyperlipidemia, unspecified: Secondary | ICD-10-CM | POA: Diagnosis present

## 2021-08-13 DIAGNOSIS — Z833 Family history of diabetes mellitus: Secondary | ICD-10-CM

## 2021-08-13 DIAGNOSIS — E1022 Type 1 diabetes mellitus with diabetic chronic kidney disease: Secondary | ICD-10-CM | POA: Diagnosis present

## 2021-08-13 DIAGNOSIS — E1065 Type 1 diabetes mellitus with hyperglycemia: Secondary | ICD-10-CM | POA: Diagnosis present

## 2021-08-13 DIAGNOSIS — Z7982 Long term (current) use of aspirin: Secondary | ICD-10-CM

## 2021-08-13 DIAGNOSIS — K219 Gastro-esophageal reflux disease without esophagitis: Secondary | ICD-10-CM | POA: Diagnosis present

## 2021-08-13 DIAGNOSIS — B961 Klebsiella pneumoniae [K. pneumoniae] as the cause of diseases classified elsewhere: Secondary | ICD-10-CM | POA: Diagnosis present

## 2021-08-13 DIAGNOSIS — Z932 Ileostomy status: Secondary | ICD-10-CM

## 2021-08-13 DIAGNOSIS — F419 Anxiety disorder, unspecified: Secondary | ICD-10-CM | POA: Diagnosis present

## 2021-08-13 DIAGNOSIS — I10 Essential (primary) hypertension: Secondary | ICD-10-CM | POA: Diagnosis present

## 2021-08-13 DIAGNOSIS — Z794 Long term (current) use of insulin: Secondary | ICD-10-CM | POA: Diagnosis not present

## 2021-08-13 DIAGNOSIS — D3502 Benign neoplasm of left adrenal gland: Secondary | ICD-10-CM | POA: Diagnosis present

## 2021-08-13 DIAGNOSIS — Z8551 Personal history of malignant neoplasm of bladder: Secondary | ICD-10-CM | POA: Diagnosis not present

## 2021-08-13 DIAGNOSIS — N1832 Chronic kidney disease, stage 3b: Secondary | ICD-10-CM | POA: Diagnosis present

## 2021-08-13 DIAGNOSIS — Z7984 Long term (current) use of oral hypoglycemic drugs: Secondary | ICD-10-CM

## 2021-08-13 DIAGNOSIS — D631 Anemia in chronic kidney disease: Secondary | ICD-10-CM | POA: Diagnosis present

## 2021-08-13 DIAGNOSIS — Z888 Allergy status to other drugs, medicaments and biological substances status: Secondary | ICD-10-CM

## 2021-08-13 DIAGNOSIS — R7881 Bacteremia: Secondary | ICD-10-CM | POA: Diagnosis not present

## 2021-08-13 DIAGNOSIS — I129 Hypertensive chronic kidney disease with stage 1 through stage 4 chronic kidney disease, or unspecified chronic kidney disease: Secondary | ICD-10-CM | POA: Diagnosis present

## 2021-08-13 DIAGNOSIS — Z906 Acquired absence of other parts of urinary tract: Secondary | ICD-10-CM

## 2021-08-13 DIAGNOSIS — N179 Acute kidney failure, unspecified: Secondary | ICD-10-CM | POA: Diagnosis present

## 2021-08-13 DIAGNOSIS — N390 Urinary tract infection, site not specified: Secondary | ICD-10-CM | POA: Diagnosis present

## 2021-08-13 DIAGNOSIS — E109 Type 1 diabetes mellitus without complications: Secondary | ICD-10-CM | POA: Diagnosis present

## 2021-08-13 DIAGNOSIS — E1069 Type 1 diabetes mellitus with other specified complication: Secondary | ICD-10-CM | POA: Diagnosis present

## 2021-08-13 DIAGNOSIS — Z79899 Other long term (current) drug therapy: Secondary | ICD-10-CM

## 2021-08-13 DIAGNOSIS — Z6834 Body mass index (BMI) 34.0-34.9, adult: Secondary | ICD-10-CM

## 2021-08-13 LAB — CBC WITH DIFFERENTIAL/PLATELET
Abs Immature Granulocytes: 0.05 10*3/uL (ref 0.00–0.07)
Basophils Absolute: 0 10*3/uL (ref 0.0–0.1)
Basophils Relative: 0 %
Eosinophils Absolute: 0 10*3/uL (ref 0.0–0.5)
Eosinophils Relative: 0 %
HCT: 26.9 % — ABNORMAL LOW (ref 39.0–52.0)
Hemoglobin: 8 g/dL — ABNORMAL LOW (ref 13.0–17.0)
Immature Granulocytes: 1 %
Lymphocytes Relative: 13 %
Lymphs Abs: 1.4 10*3/uL (ref 0.7–4.0)
MCH: 25.8 pg — ABNORMAL LOW (ref 26.0–34.0)
MCHC: 29.7 g/dL — ABNORMAL LOW (ref 30.0–36.0)
MCV: 86.8 fL (ref 80.0–100.0)
Monocytes Absolute: 0.7 10*3/uL (ref 0.1–1.0)
Monocytes Relative: 6 %
Neutro Abs: 8.7 10*3/uL — ABNORMAL HIGH (ref 1.7–7.7)
Neutrophils Relative %: 80 %
Platelets: 192 10*3/uL (ref 150–400)
RBC: 3.1 MIL/uL — ABNORMAL LOW (ref 4.22–5.81)
RDW: 16.4 % — ABNORMAL HIGH (ref 11.5–15.5)
WBC: 10.9 10*3/uL — ABNORMAL HIGH (ref 4.0–10.5)
nRBC: 0 % (ref 0.0–0.2)

## 2021-08-13 LAB — URINALYSIS, ROUTINE W REFLEX MICROSCOPIC
Bilirubin Urine: NEGATIVE
Glucose, UA: NEGATIVE mg/dL
Ketones, ur: NEGATIVE mg/dL
Nitrite: NEGATIVE
Protein, ur: 100 mg/dL — AB
Specific Gravity, Urine: 1.006 (ref 1.005–1.030)
pH: 6 (ref 5.0–8.0)

## 2021-08-13 LAB — BLOOD CULTURE ID PANEL (REFLEXED) - BCID2

## 2021-08-13 LAB — COMPREHENSIVE METABOLIC PANEL
ALT: 23 U/L (ref 0–44)
AST: 33 U/L (ref 15–41)
Albumin: 3.3 g/dL — ABNORMAL LOW (ref 3.5–5.0)
Alkaline Phosphatase: 74 U/L (ref 38–126)
Anion gap: 9 (ref 5–15)
BUN: 25 mg/dL — ABNORMAL HIGH (ref 8–23)
CO2: 21 mmol/L — ABNORMAL LOW (ref 22–32)
Calcium: 8.5 mg/dL — ABNORMAL LOW (ref 8.9–10.3)
Chloride: 101 mmol/L (ref 98–111)
Creatinine, Ser: 1.89 mg/dL — ABNORMAL HIGH (ref 0.61–1.24)
GFR, Estimated: 34 mL/min — ABNORMAL LOW (ref >60)
Glucose, Bld: 270 mg/dL — ABNORMAL HIGH (ref 70–99)
Potassium: 3.9 mmol/L (ref 3.5–5.1)
Sodium: 131 mmol/L — ABNORMAL LOW (ref 135–145)
Total Bilirubin: 1 mg/dL (ref 0.3–1.2)
Total Protein: 7.1 g/dL (ref 6.5–8.1)

## 2021-08-13 LAB — LACTIC ACID, PLASMA
Lactic Acid, Venous: 1.7 mmol/L (ref 0.5–1.9)
Lactic Acid, Venous: 1 mmol/L (ref 0.5–1.9)

## 2021-08-13 LAB — GLUCOSE, CAPILLARY
Glucose-Capillary: 208 mg/dL — ABNORMAL HIGH (ref 70–99)
Glucose-Capillary: 237 mg/dL — ABNORMAL HIGH (ref 70–99)

## 2021-08-13 MED ORDER — INSULIN ASPART 100 UNIT/ML IJ SOLN
0.0000 [IU] | Freq: Three times a day (TID) | INTRAMUSCULAR | Status: DC
  Administered 2021-08-13: 5 [IU] via SUBCUTANEOUS
  Administered 2021-08-14: 8 [IU] via SUBCUTANEOUS
  Administered 2021-08-14: 5 [IU] via SUBCUTANEOUS
  Administered 2021-08-14: 8 [IU] via SUBCUTANEOUS
  Administered 2021-08-15 (×3): 5 [IU] via SUBCUTANEOUS
  Administered 2021-08-16: 3 [IU] via SUBCUTANEOUS

## 2021-08-13 MED ORDER — ACETAMINOPHEN 325 MG PO TABS
650.0000 mg | ORAL_TABLET | Freq: Four times a day (QID) | ORAL | Status: DC | PRN
  Administered 2021-08-13 – 2021-08-15 (×5): 650 mg via ORAL
  Filled 2021-08-13 (×5): qty 2

## 2021-08-13 MED ORDER — SODIUM CHLORIDE 0.9 % IV SOLN
2.0000 g | INTRAVENOUS | Status: DC
  Administered 2021-08-13 – 2021-08-15 (×3): 2 g via INTRAVENOUS
  Filled 2021-08-13 (×3): qty 20

## 2021-08-13 MED ORDER — OXYCODONE HCL 5 MG PO TABS
5.0000 mg | ORAL_TABLET | ORAL | Status: DC | PRN
  Administered 2021-08-14 – 2021-08-16 (×4): 5 mg via ORAL
  Filled 2021-08-13 (×4): qty 1

## 2021-08-13 MED ORDER — INSULIN ASPART 100 UNIT/ML IJ SOLN
0.0000 [IU] | Freq: Every day | INTRAMUSCULAR | Status: DC
  Administered 2021-08-13: 2 [IU] via SUBCUTANEOUS
  Administered 2021-08-14: 3 [IU] via SUBCUTANEOUS
  Administered 2021-08-15: 2 [IU] via SUBCUTANEOUS

## 2021-08-13 MED ORDER — ENOXAPARIN SODIUM 30 MG/0.3ML IJ SOSY
30.0000 mg | PREFILLED_SYRINGE | INTRAMUSCULAR | Status: DC
  Administered 2021-08-13 – 2021-08-14 (×2): 30 mg via SUBCUTANEOUS
  Filled 2021-08-13 (×2): qty 0.3

## 2021-08-13 MED ORDER — ACETAMINOPHEN 650 MG RE SUPP: 650.0000 mg | Freq: Four times a day (QID) | RECTAL | Status: DC | PRN

## 2021-08-13 MED ORDER — INSULIN GLARGINE-YFGN 100 UNIT/ML ~~LOC~~ SOLN
20.0000 [IU] | Freq: Every day | SUBCUTANEOUS | Status: DC
  Administered 2021-08-13 – 2021-08-16 (×4): 20 [IU] via SUBCUTANEOUS
  Filled 2021-08-13 (×4): qty 0.2

## 2021-08-13 NOTE — H&P (Signed)
?History and Physical  ? ? ?Patient: [name] [name] KWI:[id number] DOB: 10/22/1933 ?DOA: 08/13/2021 ?DOS: the patient was seen and examined on 08/13/2021 ?PCP: Hoyt Koch, MD  ?Patient coming from: Home ? ?Chief Complaint:  ?Chief Complaint  ?Patient presents with  ? Abnormal Lab  ? ?HPI: [name] [name] is a 88 y.o. male with medical history significant of HTN, DM2, HLD, GERD. Presenting with fevers and chills. He's had these symptoms for about a week. He initially thought it was due to a worsening of a rash he had on his left ankle. He went to his PCP and they recommended he use ketoconazole cream on it. He declined that advice. He says the rash improved, but his fevers and chills remained. He decided to come to the ED yesterday to get evaluated. He was thought to have a viral infection. He was given fluids and seemed to feel better. He was discharged to home. This morning it was noted that his blood cx from yesterday was positive for Klebsiella bacteremia. He was notified that he should come back to the ED for evaluation. He denies any other aggravating or alleviating factors.  ? ?Review of Systems: As mentioned in the history of present illness. All other systems reviewed and are negative. ?Past Medical History:  ?Diagnosis Date  ? Adenoma of left adrenal gland   ? STABLE PER UROLOGIST NOTE DR ESKRIDGE  ? Anemia   ? Anxiety   ? Bilateral dry eyes   ? Blood transfusion without reported diagnosis   ? Cataract   ? Generalized OA   ? GERD (gastroesophageal reflux disease)   ? History of bladder cancer   ? S/P RADICAL CYSTECTOMY W/ IDEAL LOOP URINARY DIVERSION  2000  ? Hyperlipidemia   ? Hypertension   ? S/P ileostomy ([place])   ? URINARY ILEAL  ? Type 2 diabetes mellitus ([place])   ? Urothelial cancer ([place])   ? Urothelial carcinoma ([place])   ? penile urethra  ? ?Past Surgical History:  ?Procedure Laterality Date  ? CATARACT EXTRACTION Right   ? CYSTOSCOPY WITH BIOPSY N/A 01/22/2013  ? Procedure: CYSTOSCOPY WITH  BIOPSY;  Surgeon: Fredricka Bonine, MD;  [facility];  Service: Urology;  Laterality: N/A;  Urethra Biopsy  ? CYSTOSCOPY WITH BIOPSY N/A 04/16/2013  ? Procedure: CYSTOSCOPY WITH BIOPSY OF URETHRA/FULGURATION;  Surgeon: Fredricka Bonine, MD;  Location: [facility];  Service: Urology;  Laterality: N/A;  ? RADICAL CYSTECTOMY AND ILEAL LOOP DIVERSION  2000  ? BLADDER CANCER  ? ?Social History:  reports that he quit smoking about 45 years ago. His smoking use included cigarettes. He has a 30.00 pack-year smoking history. He has never used smokeless tobacco. He reports current alcohol use. He reports that he does not use drugs. ? ?Allergies  ?Allergen Reactions  ? Ferrous Sulfate   ?  Other reaction(s): Constipation  ? Lisinopril Swelling  ?  : facial swelling  ? Glipizide Rash  ? ? ?Family History  ?Problem Relation Age of Onset  ? Diabetes Mother   ? Colon cancer Neg Hx   ? Esophageal cancer Neg Hx   ? Rectal cancer Neg Hx   ? Stomach cancer Neg Hx   ? ? ?Prior to Admission medications   ?Medication Sig Start Date End Date Taking? Authorizing Provider  ?acetaminophen (TYLENOL) 325 MG tablet Take 325-650 mg by mouth as needed for fever or headache.    [provider]  ?amLODipine (NORVASC) 2.5 MG tablet  Take 2.5 mg by mouth every morning.    [provider]  ?ascorbic acid (VITAMIN C) 250 MG tablet TAKE ONE TABLET BY MOUTH MONDAY,WEDNESDAY,FRIDAY FOR INADEQUATE VITAMIN C 07/14/21   [provider]  ?aspirin 325 MG tablet Take 1 tablet (325 mg total) by mouth daily. 01/24/13   Festus Aloe, MD  ?atenolol (TENORMIN) 25 MG tablet Take 25 mg by mouth every morning.    [provider]  ?Cholecalciferol 25 MCG (1000 UT) tablet Take 3 tablets by mouth daily. 06/22/21   [provider]  ?ferrous sulfate 220 (44 Fe) MG/5ML solution TAKE 1 TEASPOONFUL BY MOUTH MONDAY,WEDNESDAY,FRIDAY FOR IRON REPLACEMENT 07/14/21   [provider]  ?insulin glargine-yfgn (SEMGLEE) 100 UNIT/ML injection Inject 38 Units into the skin daily. 06/18/21   [provider]  ?insulin regular (NOVOLIN R) 100 units/mL injection Inject 5 Units into the skin 3 (three) times daily before meals. Sliding scale as needed    [provider]  ?ketoconazole (NIZORAL) 2 % cream Apply 1 application topically 2 (two) times daily. 07/05/18   Hoyt Koch, MD  ?ketotifen (ZADITOR) 0.025 % ophthalmic solution Place 1 drop into both eyes 2 (two) times daily as needed (irritation).    [provider]  ?losartan (COZAAR) 50 MG tablet Take 50 mg by mouth 2 (two) times daily.    [provider]  ?magnesium hydroxide (MILK OF MAGNESIA) 400 MG/5ML suspension Take 30 mLs by mouth daily as needed for moderate constipation.    [provider]  ?metFORMIN (GLUCOPHAGE) 1000 MG tablet Take 500 mg by mouth 2 (two) times daily with a meal.    [provider]  ?montelukast (SINGULAIR) 10 MG tablet Take 1 tablet (10 mg total) by mouth at bedtime. 02/10/17   Hoyt Koch, MD  ?mupirocin ointment (BACTROBAN) 2 % Apply 1 application. topically 2 (two) times daily. 08/06/21   Ailene Ards, NP  ?nystatin ointment (MYCOSTATIN) Apply 1 application topically 2 (two) times daily. 04/17/18   Hoyt Koch, MD  ?omeprazole (PRILOSEC) 20 MG capsule Take 40 mg by mouth 2 (two) times daily before a meal.    [provider]  ?polyvinyl alcohol (LIQUIFILM TEARS) 1.4 % ophthalmic solution Place 1 drop into both eyes 3 (three) times daily.     [provider]  ?ranitidine (ZANTAC) 150 MG tablet TAKE 1 TABLET (150 MG TOTAL) BY MOUTH AT BEDTIME. 06/04/18   Hoyt Koch, MD  ?simvastatin (ZOCOR) 40 MG tablet Take 20 mg by mouth every evening.    [provider]  ?Vitamin D, Ergocalciferol, (DRISDOL) 1.25 MG (50000 UNIT) CAPS capsule Take 1 capsule (50,000 Units total) by mouth every 7 (seven) days. 09/21/20    Hoyt Koch, MD  ? ? ?Physical Exam: ?Vitals:  ? 08/13/21 1030 08/13/21 1100 08/13/21 1130 08/13/21 1200  ?BP: 138/68 (!) 143/72 138/69 137/68  ?Pulse: 60 60 (!) 55 (!) 59  ?Resp: '16 15 16 15  '$ ?Temp:      ?TempSrc:      ?SpO2: 98% 99% 98% 97%  ?Weight:      ?Height:      ? ?General: 88 y.o. male resting in bed in NAD ?Eyes: PERRL, normal sclera ?ENMT: Nares patent w/o discharge, orophaynx clear, dentition normal, ears w/o discharge/lesions/ulcers ?Neck: Supple, trachea midline ?Cardiovascular: RRR, +S1, S2, no m/g/r, equal pulses throughout ?Respiratory: CTABL, no w/r/r, normal WOB ?GI: BS+, NDNT, no masses noted, urostomy ?MSK: No c/c; trace b/l edema ?Neuro:  A&O x 3, no focal deficits ?Psyc: Appropriate interaction and affect, calm/cooperative ? ?Data Reviewed: ? ?Na+  131 ?CO2  21 ?Glucose  270 ?SCR  1.89 ?Lactic acid 1.7 -> 1.0 ?WBC  10.9 ?Hgb  8.0 ? ?UA leukocyte positive ?CXR: No active disease. ? ?Assessment and Plan: ?No notes have been filed under this hospital service. ?Service: Hospitalist ?Klebsiella bacteremia ?    - admit to inpt, tele ?    - source is likely UTI ?    - continue rocephin for now ?    - follow previous Bld Cx for speciation; follow UCx ? ?DM2, uncontrolled ?    - A1c, SSI, glucose checks, DM diet ? ?AKI on CKD3b ?    - fluids, watch nephrotoxins, check renal US ? ?HTN ?    - resume home regimen when confirmed ? ?HLD ?    - statin ? ?GERD ?    - PPI ? ? Advance Care Planning:   Code Status: DNR ? ?Consults: EDP spoke with ID, they will follow ? ?Family Communication: None at bedside ? ?Severity of Illness: ?The appropriate patient status for this patient is INPATIENT. Inpatient status is judged to be reasonable and necessary in order to provide the required intensity of service to ensure the patient's safety. The patient's presenting symptoms, physical exam findings, and initial radiographic and laboratory data in the context of their chronic comorbidities is felt to place them  at high risk for further clinical deterioration. Furthermore, it is not anticipated that the patient will be medically stable for discharge from the hospital within 2 midnights of admission.  ? ?* I cer

## 2021-08-13 NOTE — Progress Notes (Signed)
Inpatient Diabetes Program Recommendations ? ?AACE/ADA: New Consensus Statement on Inpatient Glycemic Control (2015) ? ?Target Ranges:  Prepandial:   less than 140 mg/dL ?     Peak postprandial:   less than 180 mg/dL (1-2 hours) ?     Critically ill patients:  140 - 180 mg/dL  ? ?Lab Results  ?Component Value Date  ? GLUCAP 119 (H) 04/16/2013  ? HGBA1C 7.7 06/18/2021  ? ? ?Review of Glycemic Control ? Latest Reference Range & Units 08/13/21 09:24  ?Glucose 70 - 99 mg/dL 270 (H)  ? ?Diabetes history: DM 2 ?Outpatient Diabetes medications:  ?Semglee 38 units daily, Novolin R 5 units tid with meals, metformin 500 mg bid ?Current orders for Inpatient glycemic control:  ?None yet ? ?Inpatient Diabetes Program Recommendations:   ?Note plans for admit. Consider adding Semglee 20 units daily (approx. 1/2 of home dose) and Novolog moderate tid with meals and HS.  ? ?Thanks,  ?Adah Perl, RN, BC-ADM ?Inpatient Diabetes Coordinator ?Pager [phone]   ? ?

## 2021-08-13 NOTE — Telephone Encounter (Signed)
Received positive blood culture results from lab. Consulted with Dr. Karle Starch who advised pt needs to return to ED for follow up treatment. Will have day shift make contact with pt.  ?

## 2021-08-13 NOTE — ED Triage Notes (Signed)
Patient was seen in the ED yesterday for a fever. ? ?Patient was called today to return for IV antibiotics due + blood cultures. ?

## 2021-08-13 NOTE — Consult Note (Signed)
? ? ?[facility]  ?                                                                                     ? ?Patient Identification: ?Patient Name: [name] [name] MRN: [id number] [place] Date: 08/13/2021  8:50 AM ?Today's Date: 08/13/2021 ?Reason for consult: GNR bacteremia  ?Requesting provider: Silvana Newness ? ?Active Problems: ?  * No active hospital problems. * ? ? ?Antibiotics:  ?None  ? ?Lines/Hardware: ? ?Assessment ?# Kleb pneumo bacteremia - likely source is GI vs GU ?# AKI on CKD ?# Bladder ca s/o cystectomy with ileal loop urinary diversion ?# Type 2 DM ? ?Recommendations  ?Start ceftriaxone 2 g IV daily ?Follow-up sensitivities of Klebsiella pneumonia ?Follow-up renal ultrasound ?Monitor CBC and CMP ?Will switch to p.o. antibiotics depending on sensitivities over the weekend if clinically doing well.  Plan for 10 days total ( IV and PO) ? ?Dr Juleen China will monitor cultures over the weekend ? ?Rest of the management as per the primary team. Please call with questions or concerns.  ?Thank you for the consult ? ?Rosiland Oz, MD ?Infectious Disease Physician ?[facility] ?[address] ?[place], [place] [address] ?Phone: [phone]  Fax: [phone] ? ?__________________________________________________________________________________________________________ ?HPI and Hospital Course: ?88 Y O male with a h/o Bladder ca s/p cystectomy w ileal loop urinary diversion 2000, Type 2 DM, HLD, HTN, OA. GERD who was called back for blood cultures growing Klebsiella pneumoniae from 3/30. Seen in the ED 3/30 for fever where work up was largely unremarkable except mild leukocytosis. UA with many WBCs. CT abd/pelvis with no acute abnormality. However, blood cultures now with Kleb pneumo.  Patient states he has been having fevers and chills for a week, Also had constipation and  used miralax which later caused diarrhea.  Denies any respiratory symptoms.  He showed me a mupirocin ointment which she had applied for cellulitis of left lower extremity( resolved). ? ?At ED afebrile, WBC 10.9 ?Imagings as below ? ?ROS: ?General- Denies loss of appetite and loss of weight, fevers and chills + ?HEENT - Denies headache, blurry vision, neck pain, sinus pain ?Chest - Denies any chest pain, SOB or cough ?CVS- Denies any dizziness/lightheadedness, syncopal attacks, palpitations ?Abdomen- Denies any nausea, vomiting, abdominal pain, hematochezia; diarrhea+ ?Neuro - Denies any weakness, numbness, tingling sensation ?Psych - Denies any changes in mood irritability or depressive symptoms ?GU- Denies any burning, dysuria, hematuria or increased frequency of urination( ileal urinary diversion+) ?Skin - denies any rashes/lesions ?MSK - denies any joint pain/swelling or restricted ROM  ? ?Past Medical History:  ?Diagnosis Date  ? Adenoma of left adrenal gland   ? STABLE PER UROLOGIST NOTE DR ESKRIDGE  ? Anemia   ? Anxiety   ? Bilateral dry eyes   ? Blood transfusion without reported diagnosis   ? Cataract   ? Generalized OA   ? GERD (gastroesophageal reflux disease)   ? History of bladder cancer   ? S/P RADICAL CYSTECTOMY W/ IDEAL LOOP URINARY DIVERSION  2000  ? Hyperlipidemia   ? Hypertension   ? S/P ileostomy ([place])   ?  URINARY ILEAL  ? Type 2 diabetes mellitus ([place])   ? Urothelial cancer ([place])   ? Urothelial carcinoma ([place])   ? penile urethra  ? ?Past Surgical History:  ?Procedure Laterality Date  ? CATARACT EXTRACTION Right   ? CYSTOSCOPY WITH BIOPSY N/A 01/22/2013  ? Procedure: CYSTOSCOPY WITH BIOPSY;  Surgeon: Fredricka Bonine, MD;  Location[facility];  Service: Urology;  Laterality: N/A;  Urethra Biopsy  ? CYSTOSCOPY WITH BIOPSY N/A 04/16/2013  ? Procedure: CYSTOSCOPY WITH BIOPSY OF URETHRA/FULGURATION;  Surgeon: Fredricka Bonine, MD;  Location: [facility];   Service: Urology;  Laterality: N/A;  ? RADICAL CYSTECTOMY AND ILEAL LOOP DIVERSION  2000  ? BLADDER CANCER  ? ? ? ?Scheduled Meds: ?Continuous Infusions: ? cefTRIAXone (ROCEPHIN)  IV    ? ?PRN Meds:. ? ?Allergies  ?Allergen Reactions  ? Ferrous Sulfate   ?  Other reaction(s): Constipation  ? Lisinopril Swelling  ?  : facial swelling  ? Glipizide Rash  ? ?Social History  ? ?Socioeconomic History  ? Marital status: Widowed  ?  Spouse name: Not on file  ? Number of children: 2  ? Years of education: Not on file  ? Highest education level: Not on file  ?Occupational History  ? Occupation: Art gallery manager  ? Occupation: CMA  ? Occupation: businessman  ?Tobacco Use  ? Smoking status: Former  ?  Packs/day: 1.50  ?  Years: 20.00  ?  Pack years: 30.00  ?  Types: Cigarettes  ?  Quit date: 01/19/1976  ?  Years since quitting: 45.5  ? Smokeless tobacco: Never  ?Vaping Use  ? Vaping Use: Never used  ?Substance and Sexual Activity  ? Alcohol use: Yes  ?  Comment: OCCASIONAL  ? Drug use: No  ? Sexual activity: Not on file  ?Other Topics Concern  ? Not on file  ?Social History Narrative  ?  He has a grandson.  ? Lives alone. [place], but doesn't make long trips.  ? ?Social Determinants of Health  ? ?Financial Resource Strain: Not on file  ?Food Insecurity: Not on file  ?Transportation Needs: Not on file  ?Physical Activity: Not on file  ?Stress: Not on file  ?Social Connections: Not on file  ?Intimate Partner Violence: Not on file  ? ?Breast Cancer-relatedfamily history is not on file. ? ?Vitals ?BP (!) 161/75 (BP Location: Left Arm)   Pulse 69   Temp 98.4 ?F (36.9 ?C) (Oral)   Resp 18   Ht '5\' 11"'$  (1.803 m)   Wt 113.3 kg   SpO2 96%   BMI 34.84 kg/m?  ? ?Physical Exam ?Constitutional:  lying in the bed, not in acute distress ?   Comments:  ? ?Cardiovascular:  ?   Rate and Rhythm: Normal rate and regular rhythm.  ?   Heart sounds: ? ?Pulmonary:  ?   Effort: Pulmonary effort is normal on room air ?   Comments: Clear lung sounds  bilaterally ? ?Abdominal:  ?   Palpations: Abdomen is soft.  ?   Tenderness: Nontender and nondistended, right lower quadrant colostomy with clear urine in the bag ? ?Musculoskeletal:     ?   General: No swelling or tenderness.  Left ankle-no cellulitis ? ?Skin: ?   Comments:  ? ?Neurological:  ?   General: Grossly nonfocal, awake alert and oriented ? ?Psychiatric:     ?   Mood and Affect: Mood normal.  ? ? ?Pertinent Microbiology ?Results for orders placed or  performed during the hospital encounter of 08/12/21  ?Culture, blood (Routine x 2)     Status: None (Preliminary result)  ? Collection Time: 08/12/21  8:18 AM  ? Specimen: BLOOD  ?Result Value Ref Range Status  ? Specimen Description   Final  ?  BLOOD LEFT ANTECUBITAL ?Performed at [facility] [place] [address] [place], [place] [address] ?  ? Special Requests   Final  ?  BOTTLES DRAWN AEROBIC AND ANAEROBIC Blood Culture results may not be optimal due to an excessive volume of blood received in culture bottles ?Performed at [facility], [place] [address], [place], [place] [address] ?  ? Culture  Setup Time   Final  ?  GRAM NEGATIVE RODS ?ANAEROBIC BOTTLE ONLY ?CRITICAL VALUE NOTED.  VALUE IS CONSISTENT WITH PREVIOUSLY REPORTED AND CALLED VALUE. ?Performed at [facility], [place] [address] [place], [place] [address] ?  ? Culture GRAM NEGATIVE RODS  Final  ? Report Status PENDING  Incomplete  ?Blood Culture ID Panel (Reflexed)     Status: Abnormal  ? Collection Time: 08/12/21  8:18 AM  ?Result Value Ref Range Status  ? Enterococcus faecalis NOT DETECTED NOT DETECTED Final  ? Enterococcus Faecium NOT DETECTED NOT DETECTED Final  ? Listeria monocytogenes NOT DETECTED NOT DETECTED Final  ? Staphylococcus species NOT DETECTED NOT DETECTED Final  ? Staphylococcus aureus (BCID) NOT DETECTED NOT DETECTED Final  ? Staphylococcus epidermidis NOT DETECTED NOT DETECTED Final  ? Staphylococcus lugdunensis NOT DETECTED NOT DETECTED  Final  ? Streptococcus species NOT DETECTED NOT DETECTED Final  ? Streptococcus agalactiae NOT DETECTED NOT DETECTED Final  ? Streptococcus pneumoniae NOT DETECTED NOT DETECTED Final  ? Streptococcus pyogenes NOT DETECTED NOT D

## 2021-08-13 NOTE — Assessment & Plan Note (Signed)
Reviewed medications he has for rash and advised it is safe to try ketoconazole ([facility] gave [facility]) or mupiricin he also was given by our office.  ?

## 2021-08-13 NOTE — Assessment & Plan Note (Signed)
Unclear historian but chronically ill and he is not sure if he is feeling more fatigued than normal. [facility] and [facility] which are reviewed and reviewed with him. Recent labs and echo normal from [place]. ?

## 2021-08-13 NOTE — ED Provider Notes (Signed)
?[facility] ?Provider Note ? ? ?CSN: [id number] ?Arrival date & time: 08/13/21  0848 ? ?  ? ?History ? ?Chief Complaint  ?Patient presents with  ? Abnormal Lab  ? ? ?[name] [name] is a 88 y.o. male. ? ?Patient presents after postive blood culture. Patient seen here yesterday for fever and chill.s Hx of bladder cancer, in remisson, not on chemotherapy. On bactroban for lower leg infection that has improved. Deneis any cough or sputum production or pain with urination. No ETOH or drug use. Still having chills. Nothing has made it worse or better.  Patient was called back after he had abnormal blood culture. ? ?The history is provided by the patient.  ? ?  ? ?Home Medications ?Prior to Admission medications   ?Medication Sig Start Date End Date Taking? Authorizing Provider  ?acetaminophen (TYLENOL) 325 MG tablet Take 325-650 mg by mouth as needed for fever or headache.    [provider]  ?amLODipine (NORVASC) 2.5 MG tablet Take 2.5 mg by mouth every morning.    [provider]  ?ascorbic acid (VITAMIN C) 250 MG tablet TAKE ONE TABLET BY MOUTH MONDAY,WEDNESDAY,FRIDAY FOR INADEQUATE VITAMIN C 07/14/21   [provider]  ?aspirin 325 MG tablet Take 1 tablet (325 mg total) by mouth daily. 01/24/13   Festus Aloe, MD  ?atenolol (TENORMIN) 25 MG tablet Take 25 mg by mouth every morning.    [provider]  ?Cholecalciferol 25 MCG (1000 UT) tablet Take 3 tablets by mouth daily. 06/22/21   [provider]  ?ferrous sulfate 220 (44 Fe) MG/5ML solution TAKE 1 TEASPOONFUL BY MOUTH MONDAY,WEDNESDAY,FRIDAY FOR IRON REPLACEMENT 07/14/21   [provider]  ?insulin glargine-yfgn (SEMGLEE) 100 UNIT/ML injection Inject 38 Units into the skin daily. 06/18/21   [provider]  ?insulin regular (NOVOLIN R) 100 units/mL injection Inject 5 Units into the skin 3 (three) times daily before meals. Sliding scale as needed    [provider]  ?ketoconazole (NIZORAL) 2 % cream Apply 1 application topically 2 (two) times daily. 07/05/18   Hoyt Koch, MD  ?ketotifen (ZADITOR) 0.025 % ophthalmic solution Place 1 drop into both eyes 2 (two) times daily as needed (irritation).    [provider]  ?losartan (COZAAR) 50 MG tablet Take 50 mg by mouth 2 (two) times daily.    [provider]  ?magnesium hydroxide (MILK OF MAGNESIA) 400 MG/5ML suspension Take 30 mLs by mouth daily as needed for moderate constipation.    [provider]  ?metFORMIN (GLUCOPHAGE) 1000 MG tablet Take 500 mg by mouth 2 (two) times daily with a meal.    [provider]  ?montelukast (SINGULAIR) 10 MG tablet Take 1 tablet (10 mg total) by mouth at bedtime. 02/10/17   Hoyt Koch, MD  ?mupirocin ointment (BACTROBAN) 2 % Apply 1 application. topically 2 (two) times daily. 08/06/21   Ailene Ards, NP  ?nystatin ointment (MYCOSTATIN) Apply 1 application topically 2 (two) times daily. 04/17/18   Hoyt Koch, MD  ?omeprazole (PRILOSEC) 20 MG capsule Take 40 mg by mouth 2 (two) times daily before a meal.    [provider]  ?polyvinyl alcohol (LIQUIFILM TEARS) 1.4 % ophthalmic solution Place 1 drop into both eyes 3 (three) times daily.     [provider]  ?ranitidine (ZANTAC) 150 MG tablet TAKE 1 TABLET (150 MG TOTAL) BY MOUTH AT BEDTIME. 06/04/18   Hoyt Koch, MD  ?simvastatin (ZOCOR) 40 MG  tablet Take 20 mg by mouth every evening.    [provider]  ?Vitamin D, Ergocalciferol, (DRISDOL) 1.25 MG (50000 UNIT) CAPS capsule Take 1 capsule (50,000 Units total) by mouth every 7 (seven) days. 09/21/20   Hoyt Koch, MD  ?   ? ?Allergies    ?Ferrous sulfate, Lisinopril, and Glipizide   ? ?Review of Systems   ?Review of Systems ? ?Physical Exam ?Updated Vital Signs ?BP 128/70   Pulse (!) 44   Temp 98.4 ?F (36.9 ?C) (Oral)   Resp 18   Ht '5\' 11"'$  (1.803 m)   Wt 113.3 kg   SpO2 100%    BMI 34.84 kg/m?  ?Physical Exam ?Vitals and nursing note reviewed.  ?Constitutional:   ?   General: He is not in acute distress. ?   Appearance: He is well-developed. He is not ill-appearing.  ?HENT:  ?   Head: Normocephalic and atraumatic.  ?   Nose: Nose normal.  ?   Mouth/Throat:  ?   Mouth: Mucous membranes are moist.  ?Eyes:  ?   Extraocular Movements: Extraocular movements intact.  ?   Conjunctiva/sclera: Conjunctivae normal.  ?   Pupils: Pupils are equal, round, and reactive to light.  ?Cardiovascular:  ?   Rate and Rhythm: Normal rate and regular rhythm.  ?   Pulses: Normal pulses.  ?   Heart sounds: Normal heart sounds. No murmur heard. ?Pulmonary:  ?   Effort: Pulmonary effort is normal. No respiratory distress.  ?   Breath sounds: Normal breath sounds.  ?Abdominal:  ?   Palpations: Abdomen is soft.  ?   Tenderness: There is no abdominal tenderness.  ?Musculoskeletal:     ?   General: No swelling. Normal range of motion.  ?   Cervical back: Normal range of motion and neck supple.  ?Skin: ?   General: Skin is warm and dry.  ?   Capillary Refill: Capillary refill takes less than 2 seconds.  ?   Findings: No erythema.  ?Neurological:  ?   General: No focal deficit present.  ?   Mental Status: He is alert and oriented to person, place, and time.  ?   Cranial Nerves: No cranial nerve deficit.  ?   Sensory: No sensory deficit.  ?   Motor: No weakness.  ?   Coordination: Coordination normal.  ?Psychiatric:     ?   Mood and Affect: Mood normal.  ? ? ?ED Results / Procedures / Treatments   ?Labs ?(all labs ordered are listed, but only abnormal results are displayed) ?Labs Reviewed  ?CBC WITH DIFFERENTIAL/PLATELET - Abnormal; Notable for the following components:  ?    Result Value  ? WBC 10.9 (*)   ? RBC 3.10 (*)   ? Hemoglobin 8.0 (*)   ? HCT 26.9 (*)   ? MCH 25.8 (*)   ? MCHC 29.7 (*)   ? RDW 16.4 (*)   ? Neutro Abs 8.7 (*)   ? All other components within normal limits  ?COMPREHENSIVE METABOLIC PANEL - Abnormal;  Notable for the following components:  ? Sodium 131 (*)   ? CO2 21 (*)   ? Glucose, Bld 270 (*)   ? BUN 25 (*)   ? Creatinine, Ser 1.89 (*)   ? Calcium 8.5 (*)   ? Albumin 3.3 (*)   ? GFR, Estimated 34 (*)   ? All other components within normal limits  ?URINE CULTURE  ?LACTIC ACID, PLASMA  ?LACTIC ACID,  PLASMA  ?URINALYSIS, ROUTINE W REFLEX MICROSCOPIC  ? ? ?EKG ?None ? ?Radiology ?DG Chest 2 View ? ?Result Date: 08/12/2021 ?CLINICAL DATA:  Suspected Sepsis EXAM: CHEST - 2 VIEW COMPARISON:  Chest radiograph Sep 25, 2014. FINDINGS: No consolidation. No visible pleural effusions or pneumothorax. Cardiomediastinal silhouette is mildly enlarged, similar. No evidence of acute osseous abnormality. IMPRESSION: No evidence of acute cardiopulmonary disease. Electronically Signed   By: Margaretha Sheffield M.D.   On: 08/12/2021 09:09  ? ?CT ABDOMEN PELVIS W CONTRAST ? ?Result Date: 08/12/2021 ?CLINICAL DATA:  88 year old male with abdominal pain and fever of 102 F. left lower extremity infection. History of bladder cancer. EXAM: CT ABDOMEN AND PELVIS WITH CONTRAST TECHNIQUE: Multidetector CT imaging of the abdomen and pelvis was performed using the standard protocol following bolus administration of intravenous contrast. RADIATION DOSE REDUCTION: This exam was performed according to the departmental dose-optimization program which includes automated exposure control, adjustment of the mA and/or kV according to patient size and/or use of iterative reconstruction technique. CONTRAST:  74m OMNIPAQUE IOHEXOL 300 MG/ML  SOLN COMPARISON:  CT Abdomen and Pelvis 07/11/2019 and earlier. FINDINGS: Lower chest: Chronic subpleural scarring at the lung bases which are otherwise negative. No pericardial or pleural effusion. Hepatobiliary: Negative liver and gallbladder. Pancreas: Negative. Spleen: Negative. Adrenals/Urinary Tract: Status post cystectomy with ileal conduit. Conduit loop is nondilated and exits the right lower ventral abdominal  wall as in 2021. Nonobstructed kidneys with symmetric renal enhancement and early contrast excretion. Chronic pararenal space inflammatory stranding appears stable. Occasional small benign renal cysts appear s

## 2021-08-14 DIAGNOSIS — R7881 Bacteremia: Secondary | ICD-10-CM | POA: Diagnosis not present

## 2021-08-14 LAB — CBC
HCT: 26.1 % — ABNORMAL LOW (ref 39.0–52.0)
Hemoglobin: 7.8 g/dL — ABNORMAL LOW (ref 13.0–17.0)
MCH: 26.2 pg (ref 26.0–34.0)
MCHC: 29.9 g/dL — ABNORMAL LOW (ref 30.0–36.0)
MCV: 87.6 fL (ref 80.0–100.0)
Platelets: 197 10*3/uL (ref 150–400)
RBC: 2.98 MIL/uL — ABNORMAL LOW (ref 4.22–5.81)
RDW: 16.4 % — ABNORMAL HIGH (ref 11.5–15.5)
WBC: 7.5 10*3/uL (ref 4.0–10.5)
nRBC: 0 % (ref 0.0–0.2)

## 2021-08-14 LAB — COMPREHENSIVE METABOLIC PANEL
ALT: 21 U/L (ref 0–44)
AST: 26 U/L (ref 15–41)
Albumin: 3.1 g/dL — ABNORMAL LOW (ref 3.5–5.0)
Alkaline Phosphatase: 68 U/L (ref 38–126)
Anion gap: 7 (ref 5–15)
BUN: 20 mg/dL (ref 8–23)
CO2: 24 mmol/L (ref 22–32)
Calcium: 8.8 mg/dL — ABNORMAL LOW (ref 8.9–10.3)
Chloride: 105 mmol/L (ref 98–111)
Creatinine, Ser: 1.59 mg/dL — ABNORMAL HIGH (ref 0.61–1.24)
GFR, Estimated: 42 mL/min — ABNORMAL LOW (ref >60)
Glucose, Bld: 244 mg/dL — ABNORMAL HIGH (ref 70–99)
Potassium: 4.1 mmol/L (ref 3.5–5.1)
Sodium: 136 mmol/L (ref 135–145)
Total Bilirubin: 0.9 mg/dL (ref 0.3–1.2)
Total Protein: 6.8 g/dL (ref 6.5–8.1)

## 2021-08-14 LAB — URINE CULTURE

## 2021-08-14 LAB — GLUCOSE, CAPILLARY
Glucose-Capillary: 262 mg/dL — ABNORMAL HIGH (ref 70–99)
Glucose-Capillary: 262 mg/dL — ABNORMAL HIGH (ref 70–99)
Glucose-Capillary: 270 mg/dL — ABNORMAL HIGH (ref 70–99)

## 2021-08-14 MED ORDER — BISMUTH SUBSALICYLATE 262 MG/15ML PO SUSP
30.0000 mL | Freq: Four times a day (QID) | ORAL | Status: DC | PRN
  Filled 2021-08-14: qty 236

## 2021-08-14 MED ORDER — LOSARTAN POTASSIUM 50 MG PO TABS
50.0000 mg | ORAL_TABLET | Freq: Two times a day (BID) | ORAL | Status: DC
  Administered 2021-08-14 – 2021-08-16 (×5): 50 mg via ORAL
  Filled 2021-08-14 (×5): qty 1

## 2021-08-14 MED ORDER — MAGNESIUM HYDROXIDE 400 MG/5ML PO SUSP
30.0000 mL | Freq: Every day | ORAL | Status: DC | PRN
  Administered 2021-08-15: 30 mL via ORAL
  Filled 2021-08-14: qty 30

## 2021-08-14 MED ORDER — ENOXAPARIN SODIUM 40 MG/0.4ML IJ SOSY
40.0000 mg | PREFILLED_SYRINGE | Freq: Every day | INTRAMUSCULAR | Status: DC
  Administered 2021-08-15 – 2021-08-16 (×2): 40 mg via SUBCUTANEOUS
  Filled 2021-08-14 (×2): qty 0.4

## 2021-08-14 MED ORDER — ATENOLOL 25 MG PO TABS
25.0000 mg | ORAL_TABLET | Freq: Every morning | ORAL | Status: DC
  Administered 2021-08-14 – 2021-08-16 (×3): 25 mg via ORAL
  Filled 2021-08-14 (×3): qty 1

## 2021-08-14 MED ORDER — SIMVASTATIN 10 MG PO TABS
20.0000 mg | ORAL_TABLET | Freq: Every evening | ORAL | Status: DC
Start: 2021-08-14 — End: 2021-08-16
  Administered 2021-08-14 – 2021-08-15 (×2): 20 mg via ORAL
  Filled 2021-08-14 (×2): qty 2

## 2021-08-14 MED ORDER — AMLODIPINE BESYLATE 5 MG PO TABS
2.5000 mg | ORAL_TABLET | Freq: Every morning | ORAL | Status: DC
  Administered 2021-08-14 – 2021-08-16 (×3): 2.5 mg via ORAL
  Filled 2021-08-14 (×3): qty 1

## 2021-08-14 MED ORDER — GUAIFENESIN-DM 100-10 MG/5ML PO SYRP
5.0000 mL | ORAL_SOLUTION | ORAL | Status: DC | PRN
  Administered 2021-08-14 – 2021-08-15 (×3): 5 mL via ORAL
  Filled 2021-08-14 (×3): qty 5

## 2021-08-14 MED ORDER — PANTOPRAZOLE SODIUM 40 MG PO TBEC
40.0000 mg | DELAYED_RELEASE_TABLET | Freq: Every day | ORAL | Status: DC
  Administered 2021-08-14 – 2021-08-16 (×3): 40 mg via ORAL
  Filled 2021-08-14 (×3): qty 1

## 2021-08-14 NOTE — Assessment & Plan Note (Addendum)
Patient presented with ongoing fever and chills. ?Blood cultures growing Klebsiella pneumonia. ?Likely source is urinary tract infection. ?Continued ceftriaxone 2 g IV daily. ?Infectious disease consulted, recommended to continue same. ?Patient remains afebrile,  overall doing better. ?ID recommended patient can be discharged on Duricef 1 g twice daily for 6 more days to complete 10-day treatment for Klebsiella bacteremia. ?

## 2021-08-14 NOTE — Assessment & Plan Note (Addendum)
Continued Semglee 20 units daily. ?Continue regular insulin sliding scale. ?

## 2021-08-14 NOTE — Assessment & Plan Note (Signed)
Continue Protonix °

## 2021-08-14 NOTE — Assessment & Plan Note (Signed)
Continue simvastatin. 

## 2021-08-14 NOTE — Hospital Course (Addendum)
This 88 years old male with PMH significant for hypertension, diabetes, hyperlipidemia, GERD presented with fever and chills.  Patient reports having symptoms for about a week, he initially thought it was due to the worsening of rash that he has on his left ankle,  he went to his PCP who has prescribed him ketoconazole cream , he continues to have fever and chills, he came to the ED and he was discharged after labs were drawn.  Patient continues to have fever, blood cultures came back positive for Klebsiella bacteremia,  Patient was called to come and admitted for Klebsiella bacteremia.  Patient was continued on ceftriaxone.  Infectious disease consulted.  Patient remained afebrile,  doing much better.  Repeat blood cultures negative.  ID recommended patient can be discharged on Duricef 1 g twice daily for 6 more days to complete 10-day treatment for bacteremia.  Patient felt much better and wants to be discharged.  Patient is being discharged home. ?

## 2021-08-14 NOTE — Assessment & Plan Note (Addendum)
Continue amlodipine and atenolol. ?Continue Losartan. ?

## 2021-08-14 NOTE — Progress Notes (Addendum)
?  Progress Note ? ? ?Patient: [name] [name] AXE:[id number] DOB: 1933/08/07 DOA: 08/13/2021     1 ? ?DOS: the patient was seen and examined on 08/14/2021 ?  ?Brief hospital course: ?This 88 years old male with PMH significant for hypertension, diabetes, hyperlipidemia, GERD presented with fever and chills.  Patient reports symptoms for about a week, he initially thought it was due to the worsening of rash that he has on his left ankle he went to his PCP who has prescribed him ketoconazole cream , he continues to have fever and chills, he came to the ED and he was discharged after labs were drawn.  Patient continues to have fever, blood cultures came back positive for Klebsiella bacteremia patient was called to come and admitted. ? ?Assessment and Plan: ?* Bacteremia ?Patient presented with ongoing fever and chills. ?Blood cultures growing Klebsiella pneumonia. ?Likely source is urinary tract infection. ?Continue ceftriaxone 2 g IV daily. ?Infectious disease consulted, recommended to continue same. ?Patient remains afebrile,  overall doing better. ?If doing better can be discharged on 10 days of oral antibiotics. ?Follow-up sensitivity. ? ?AKI (acute kidney injury) ([place]) ?Baseline serum creatinine 1.34 presented with 1.89. ?Likely prerenal.  Continue IV hydration.  Avoid nephrotoxic medication. ?Serum creatinine improving. ? ?GERD ?Continue Protonix. ? ?Essential hypertension ?Continue amlodipine and atenolol. ? ?OBESITY, CLASS III ?Diet and exercise discussed in detail. ? ?HLD (hyperlipidemia) ?Continue simvastatin. ? ?Type 1 diabetes mellitus ([place]) ?Continue Semglee 20 units daily, ?Continue regular insulin sliding scale. ? ? ? ?Subjective: Patient was seen and examined at bedside.  Overnight events noted.   ?Patient reports feeling much improved.  He remained afebrile since yesterday. ? ?Physical Exam: ?Vitals:  ? 08/14/21 0153 08/14/21 0344 08/14/21 0508 08/14/21 0845  ?BP: (!) 163/83     ?Pulse: 67     ?Resp: 18      ?Temp: 98.3 ?F (36.8 ?C) 100.2 ?F (37.9 ?C) 99.3 ?F (37.4 ?C)   ?TempSrc: Oral Oral Oral   ?SpO2: 99%   99%  ?Weight:      ?Height:      ? ?General exam: Appears comfortable, not in any acute distress.  Deconditioned ?Respiratory system: CTA bilaterally, no wheezing, no crackles, normal respiratory effort. ?Cardiovascular system: S1-S2 heard, regular rate and rhythm, no murmur. ?Gastrointestinal system: Abdomen is soft, nontender, nondistended, BS+ ?Central nervous system: Alert, oriented x3, no focal neurological deficits. ?Extremities: No edema, no cyanosis, no clubbing. ?Psychiatry: Mood, insight, judgment normal ? ? ? ?Data Reviewed: ?I have Reviewed nursing notes, Vitals, and Lab results since pt's last encounter. Pertinent lab results CBC, BMP, mag, Phos ?I have ordered test including CBC, BMP ?I have reviewed the last note from hospitalist,  ?I have discussed pt's care plan and test results with patient.  ? ?Family Communication: No family at bedside ? ?Disposition: ?Status is: Inpatient ?Remains inpatient appropriate because:  ?Admitted for Klebsiella bacteremia infectious disease consulted recommended to continue ceftriaxone follow blood cultures. ? Planned Discharge Destination: Home ? ? ? ?Time spent: 50 minutes ? ?Author: ?Shawna Clamp, MD ?08/14/2021 12:21 PM ? ?For on call review [email or web address].  ?

## 2021-08-14 NOTE — Assessment & Plan Note (Signed)
Diet and exercise discussed in detail. ?

## 2021-08-14 NOTE — Assessment & Plan Note (Addendum)
Baseline serum creatinine 1.34 presented with 1.89. ?Likely prerenal.  Continue IV hydration.   ?Avoid nephrotoxic medication. ?Serum creatinine improving.1.59>1.41 ?

## 2021-08-15 DIAGNOSIS — R7881 Bacteremia: Secondary | ICD-10-CM | POA: Diagnosis not present

## 2021-08-15 LAB — GLUCOSE, CAPILLARY
Glucose-Capillary: 234 mg/dL — ABNORMAL HIGH (ref 70–99)
Glucose-Capillary: 248 mg/dL — ABNORMAL HIGH (ref 70–99)
Glucose-Capillary: 209 mg/dL — ABNORMAL HIGH (ref 70–99)
Glucose-Capillary: 198 mg/dL — ABNORMAL HIGH (ref 70–99)
Glucose-Capillary: 218 mg/dL — ABNORMAL HIGH (ref 70–99)

## 2021-08-15 MED ORDER — CEFAZOLIN SODIUM-DEXTROSE 2-4 GM/100ML-% IV SOLN
2.0000 g | Freq: Three times a day (TID) | INTRAVENOUS | Status: DC
  Administered 2021-08-16: 2 g via INTRAVENOUS
  Filled 2021-08-15 (×2): qty 100

## 2021-08-15 MED ORDER — HYDROXYZINE HCL 25 MG PO TABS
25.0000 mg | ORAL_TABLET | Freq: Three times a day (TID) | ORAL | Status: DC | PRN
  Administered 2021-08-15 – 2021-08-16 (×2): 25 mg via ORAL
  Filled 2021-08-15 (×2): qty 1

## 2021-08-15 NOTE — Progress Notes (Signed)
Pt called writer to room to say "call the doctor--something is not right". ?VS WNL, except BP high. MD aware. ?EKG done. ?Monitoring closely. ?Pt denies pain. Stating he feels like he has "the worst flu ever--I have never been this sick". ?Have given report to night nurse.  ? ?

## 2021-08-15 NOTE — Progress Notes (Addendum)
Brief ID note: ? ?Patient overall appears to be doing better in the setting of bacteremia due to Klebsiella pneumonia likely from a urinary source.  He is afebrile over the last 24 hours with a Tmax of 99.4 ?F.  Blood cultures from 3/30 are pansensitive and he is currently on ceftriaxone.  Will narrow to cefazolin.  If he continues to improve can transition to oral antibiotics at discharge with cefadroxil 1gm BID x 10 days total (End date 08/22/21).  Will sign off please call as needed.  ? ?[name] [name] ?[facility] ?[facility] ?08/15/2021, 3:14 PM ? ?

## 2021-08-15 NOTE — Progress Notes (Addendum)
?  Progress Note ? ? ?Patient: [name] [name] VAN:[id number] DOB: 07/26/1933 DOA: 08/13/2021     2 ? ?DOS: the patient was seen and examined on 08/15/2021 ?  ?Brief hospital course: ?This 88 years old male with PMH significant for hypertension, diabetes, hyperlipidemia, GERD presented with fever and chills.  Patient reports symptoms for about a week, he initially thought it was due to the worsening of rash that he has on his left ankle he went to his PCP who has prescribed him ketoconazole cream , he continues to have fever and chills, he came to the ED and he was discharged after labs were drawn.  Patient continues to have fever, blood cultures came back positive for Klebsiella bacteremia,  Patient was called to come and admitted. ? ?Assessment and Plan: ?* Bacteremia ?Patient presented with ongoing fever and chills. ?Blood cultures growing Klebsiella pneumonia. ?Likely source is urinary tract infection. ?Continue ceftriaxone 2 g IV daily. ?Infectious disease consulted, recommended to continue same. ?Patient remains afebrile,  overall doing better. ?If doing better can be discharged on 10 days of oral antibiotics. ?Follow-up sensitivity. ? ?AKI (acute kidney injury) ([place]) ?Baseline serum creatinine 1.34 presented with 1.89. ?Likely prerenal.  Continue IV hydration.  Avoid nephrotoxic medication. ?Serum creatinine improving.1.59 ? ?GERD ?Continue Protonix. ? ?Essential hypertension ?Continue amlodipine and atenolol. ?Continue Losartan. ? ?OBESITY, CLASS III ?Diet and exercise discussed in detail. ? ?HLD (hyperlipidemia) ?Continue simvastatin. ? ?Type 1 diabetes mellitus ([place]) ?Continue Semglee 20 units daily. ?Continue regular insulin sliding scale. ? ? ? ?Subjective: Patient was seen and examined at bedside.  Overnight events noted.   ?Patient reports feeling better, he remained afebrile since admission. ?He reports pain is manageable. ? ?Physical Exam: ?Vitals:  ? 08/14/21 0845 08/14/21 1600 08/14/21 2037 08/15/21  0531  ?BP:  (!) 147/73 134/67 (!) 160/80  ?Pulse:  61 (!) 50 (!) 55  ?Resp:   18 18  ?Temp:  99.4 ?F (37.4 ?C) 97.8 ?F (36.6 ?C) 98.9 ?F (37.2 ?C)  ?TempSrc:  Oral Oral Oral  ?SpO2: 99% 97% 96% 95%  ?Weight:      ?Height:      ? ?General exam: Appears comfortable, not in any acute distress.  Deconditioned ?Respiratory system: CTA bilaterally, no wheezing, no crackles, normal respiratory effort. ?Cardiovascular system: S1-S2 heard, regular rate and rhythm, no murmur. ?Gastrointestinal system: Abdomen is soft, non tender, non distended, BS+ ?Central nervous system: Alert, oriented x 3, no focal neurological deficits. ?Extremities: No edema, no cyanosis, no clubbing. ?Psychiatry: Mood, insight, judgment normal ? ? ? ?Data Reviewed: ?I have Reviewed nursing notes, Vitals, and Lab results since pt's last encounter. Pertinent lab results CBC, BMP ?I have ordered test including BC, BMP ?I have reviewed the last note from hospitalist,  ?I have discussed pt's care plan and test results with patient.  ? ?Family Communication: No family at bedside ? ?Disposition: ?Status is: Inpatient ?Remains inpatient appropriate because:  ?Admitted for Klebsiella bacteremia,  infectious disease consulted,  recommended to continue ceftriaxone,  follow blood cultures. ? Planned Discharge Destination: Home ? ? ? ? ?Time spent:35 minutes ? ?Author: ?Shawna Clamp, MD ?08/15/2021 12:00 PM ? ?For on call review [email or web address].  ?

## 2021-08-16 DIAGNOSIS — R7881 Bacteremia: Secondary | ICD-10-CM | POA: Diagnosis not present

## 2021-08-16 LAB — GLUCOSE, CAPILLARY
Glucose-Capillary: 232 mg/dL — ABNORMAL HIGH (ref 70–99)
Glucose-Capillary: 218 mg/dL — ABNORMAL HIGH (ref 70–99)
Glucose-Capillary: 187 mg/dL — ABNORMAL HIGH (ref 70–99)

## 2021-08-16 LAB — BASIC METABOLIC PANEL
Anion gap: 8 (ref 5–15)
BUN: 16 mg/dL (ref 8–23)
CO2: 27 mmol/L (ref 22–32)
Calcium: 9.3 mg/dL (ref 8.9–10.3)
Chloride: 99 mmol/L (ref 98–111)
Creatinine, Ser: 1.43 mg/dL — ABNORMAL HIGH (ref 0.61–1.24)
GFR, Estimated: 48 mL/min — ABNORMAL LOW (ref >60)
Glucose, Bld: 193 mg/dL — ABNORMAL HIGH (ref 70–99)
Potassium: 4 mmol/L (ref 3.5–5.1)
Sodium: 134 mmol/L — ABNORMAL LOW (ref 135–145)

## 2021-08-16 LAB — CBC
HCT: 27.4 % — ABNORMAL LOW (ref 39.0–52.0)
Hemoglobin: 8.2 g/dL — ABNORMAL LOW (ref 13.0–17.0)
MCH: 25.7 pg — ABNORMAL LOW (ref 26.0–34.0)
MCHC: 29.9 g/dL — ABNORMAL LOW (ref 30.0–36.0)
MCV: 85.9 fL (ref 80.0–100.0)
Platelets: 243 10*3/uL (ref 150–400)
RBC: 3.19 MIL/uL — ABNORMAL LOW (ref 4.22–5.81)
RDW: 15.9 % — ABNORMAL HIGH (ref 11.5–15.5)
WBC: 7.4 10*3/uL (ref 4.0–10.5)
nRBC: 0.3 % — ABNORMAL HIGH (ref 0.0–0.2)

## 2021-08-16 LAB — CULTURE, BLOOD (ROUTINE X 2)

## 2021-08-16 MED ORDER — GUAIFENESIN-DM 100-10 MG/5ML PO SYRP: 5.0000 mL | ORAL_SOLUTION | ORAL | 0 refills | Status: DC | PRN

## 2021-08-16 MED ORDER — CEFADROXIL 1 G PO TABS: 1.0000 g | ORAL_TABLET | Freq: Two times a day (BID) | ORAL | 0 refills | Status: AC

## 2021-08-16 NOTE — Discharge Instructions (Signed)
Advised to follow-up with primary care physician in 1 week. ?Advised to take Duricef 1 g twice daily for 6 days to complete 10-day treatment for Klebsiella bacteremia. ?Advised to continue current medications as prescribed. ?

## 2021-08-16 NOTE — Plan of Care (Signed)
Pt aox4, pleasant and cooperative.  ? ?Problem: Education: ?Goal: Knowledge of General Education information will improve ?Description: Including pain rating scale, medication(s)/side effects and non-pharmacologic comfort measures ?Outcome: Progressing ?  ?Problem: Health Behavior/Discharge Planning: ?Goal: Ability to manage health-related needs will improve ?Outcome: Progressing ?  ?Problem: Clinical Measurements: ?Goal: Ability to maintain clinical measurements within normal limits will improve ?Outcome: Progressing ?Goal: Will remain free from infection ?Outcome: Progressing ?Goal: Diagnostic test results will improve ?Outcome: Progressing ?Goal: Respiratory complications will improve ?Outcome: Progressing ?Goal: Cardiovascular complication will be avoided ?Outcome: Progressing ?  ?Problem: Activity: ?Goal: Risk for activity intolerance will decrease ?Outcome: Progressing ?  ?Problem: Nutrition: ?Goal: Adequate nutrition will be maintained ?Outcome: Progressing ?  ?Problem: Coping: ?Goal: Level of anxiety will decrease ?Outcome: Progressing ?  ?Problem: Elimination: ?Goal: Will not experience complications related to bowel motility ?Outcome: Progressing ?Goal: Will not experience complications related to urinary retention ?Outcome: Progressing ?  ?Problem: Pain Managment: ?Goal: General experience of comfort will improve ?Outcome: Progressing ?  ?Problem: Safety: ?Goal: Ability to remain free from injury will improve ?Outcome: Progressing ?  ?Problem: Skin Integrity: ?Goal: Risk for impaired skin integrity will decrease ?Outcome: Progressing ?  ?

## 2021-08-16 NOTE — Discharge Summary (Signed)
?Physician Discharge Summary ?  ?Patient: [name] [name] MRN: [id number] DOB: 01/08/1934  ?Admit date:     08/13/2021  ?Discharge date: 08/16/21  ?Discharge Physician: Shawna Clamp  ? ?PCP: Hoyt Koch, MD  ? ?Recommendations at discharge:  ?Advised to follow-up with primary care physician in 1 week. ?Advised to take Duricef 1 g twice daily for 6 days to complete 10-day treatment for Klebsiella bacteremia. ?Advised to continue current medications as prescribed. ? ?Discharge Diagnoses: ?Principal Problem: ?  Bacteremia ?Active Problems: ?  Type 1 diabetes mellitus ([place]) ?  HLD (hyperlipidemia) ?  OBESITY, CLASS III ?  Essential hypertension ?  GERD ?  AKI (acute kidney injury) ([place]) ?  Stage 3b chronic kidney disease (CKD) ([place]) ? ?Resolved Problems: ?  * No resolved hospital problems. * ? ?Hospital Course: ?This 88 years old male with PMH significant for hypertension, diabetes, hyperlipidemia, GERD presented with fever and chills.  Patient reports having symptoms for about a week, he initially thought it was due to the worsening of rash that he has on his left ankle,  he went to his PCP who has prescribed him ketoconazole cream , he continues to have fever and chills, he came to the ED and he was discharged after labs were drawn.  Patient continues to have fever, blood cultures came back positive for Klebsiella bacteremia,  Patient was called to come and admitted for Klebsiella bacteremia.  Patient was continued on ceftriaxone.  Infectious disease consulted.  Patient remained afebrile,  doing much better.  Repeat blood cultures negative.  ID recommended patient can be discharged on Duricef 1 g twice daily for 6 more days to complete 10-day treatment for bacteremia.  Patient felt much better and wants to be discharged.  Patient is being discharged home. ? ?Assessment and Plan: ?* Bacteremia ?Patient presented with ongoing fever and chills. ?Blood cultures growing Klebsiella pneumonia. ?Likely source is  urinary tract infection. ?Continued ceftriaxone 2 g IV daily. ?Infectious disease consulted, recommended to continue same. ?Patient remains afebrile,  overall doing better. ?ID recommended patient can be discharged on Duricef 1 g twice daily for 6 more days to complete 10-day treatment for Klebsiella bacteremia. ? ?AKI (acute kidney injury) ([place]) ?Baseline serum creatinine 1.34 presented with 1.89. ?Likely prerenal.  Continue IV hydration.   ?Avoid nephrotoxic medication. ?Serum creatinine improving.1.59>1.41 ? ?GERD ?Continue Protonix. ? ?Essential hypertension ?Continue amlodipine and atenolol. ?Continue Losartan. ? ?OBESITY, CLASS III ?Diet and exercise discussed in detail. ? ?HLD (hyperlipidemia) ?Continue simvastatin. ? ?Type 1 diabetes mellitus ([place]) ?Continued Semglee 20 units daily. ?Continue regular insulin sliding scale. ? ? ? ?Pain control - [employer] Controlled Substance Reporting System database was reviewed. and patient was instructed, not to drive, operate heavy machinery, perform activities at heights, swimming or participation in water activities or provide baby-sitting services while on Pain, Sleep and Anxiety Medications; until their outpatient Physician has advised to do so again. Also recommended to not to take more than prescribed Pain, Sleep and Anxiety Medications.  ? ?Consultants: Infectious diseases ? ?Procedures performed: None ? ?Disposition: Home ? ?Diet recommendation:  ?Discharge Diet Orders (From admission, onward)  ? ?  Start     Ordered  ? 08/16/21 0000  Diet - low sodium heart healthy       ? 08/16/21 0957  ? 08/16/21 0000  Diet Carb Modified       ? 08/16/21 0957  ? ?  ?  ? ?  ? ?Cardiac diet ?DISCHARGE MEDICATION: ?Allergies as of  08/16/2021   ? ?   Reactions  ? Ferrous Sulfate Other (See Comments)  ? Constipation  ? Lisinopril Swelling, Other (See Comments)  ? Facial swelling  ? Glipizide Rash  ? ?  ? ?  ?Medication List  ?  ? ?STOP taking these medications   ? ?ferrous  sulfate 220 (44 Fe) MG/5ML solution ?  ?ketoconazole 2 % cream ?Commonly known as: NIZORAL ?  ?montelukast 10 MG tablet ?Commonly known as: SINGULAIR ?  ?nystatin ointment ?Commonly known as: MYCOSTATIN ?  ?ranitidine 150 MG tablet ?Commonly known as: ZANTAC ?  ?Vitamin D (Ergocalciferol) 1.25 MG (50000 UNIT) Caps capsule ?Commonly known as: DRISDOL ?  ? ?  ? ?TAKE these medications   ? ?acetaminophen 325 MG tablet ?Commonly known as: TYLENOL ?Take 650 mg by mouth every 6 (six) hours as needed for fever or headache. ?  ?amLODipine 2.5 MG tablet ?Commonly known as: NORVASC ?Take 2.5 mg by mouth every morning. ?  ?ascorbic acid 250 MG tablet ?Commonly known as: VITAMIN C ?Take 250 mg by mouth every Monday, Wednesday, and Friday. ?  ?aspirin 325 MG tablet ?Take 1 tablet (325 mg total) by mouth daily. ?  ?atenolol 25 MG tablet ?Commonly known as: TENORMIN ?Take 25 mg by mouth every morning. ?  ?bismuth subsalicylate 517 OH/60VP suspension ?Commonly known as: PEPTO BISMOL ?Take 30 mLs by mouth every 6 (six) hours as needed for diarrhea or loose stools. ?  ?cefadroxil 1 g tablet ?Commonly known as: DURICEF ?Take 1 tablet (1 g total) by mouth 2 (two) times daily for 6 days. ?  ?cetirizine 10 MG tablet ?Commonly known as: ZYRTEC ?Take 10 mg by mouth daily. ?  ?desonide 0.05 % cream ?Commonly known as: DESOWEN ?Apply 1 application. topically 2 (two) times daily as needed (for irritation/itching- skin folds). ?  ?guaiFENesin-dextromethorphan 100-10 MG/5ML syrup ?Commonly known as: ROBITUSSIN DM ?Take 5 mLs by mouth every 4 (four) hours as needed for cough (chest congestion). ?  ?INSULIN GLARGINE [place] ?Inject 38 Units into the skin at bedtime. ?  ?insulin regular 100 units/mL injection ?Commonly known as: NOVOLIN R ?Inject 5 Units into the skin 3 (three) times daily before meals. Sliding scale as needed ?  ?ketotifen 0.025 % ophthalmic solution ?Commonly known as: ZADITOR ?Place 1 drop into both eyes 2 (two) times daily as  needed (irritation). ?  ?losartan 50 MG tablet ?Commonly known as: COZAAR ?Take 50 mg by mouth 2 (two) times daily. ?  ?magnesium hydroxide 400 MG/5ML suspension ?Commonly known as: MILK OF MAGNESIA ?Take 30 mLs by mouth daily as needed for moderate constipation. ?  ?metFORMIN 500 MG 24 hr tablet ?Commonly known as: GLUCOPHAGE-XR ?Take 500 mg by mouth 2 (two) times daily. ?  ?mupirocin ointment 2 % ?Commonly known as: BACTROBAN ?Apply 1 application. topically 2 (two) times daily. ?  ?omeprazole 20 MG capsule ?Commonly known as: PRILOSEC ?Take 20 mg by mouth 2 (two) times daily before a meal. ?  ?polyvinyl alcohol 1.4 % ophthalmic solution ?Commonly known as: LIQUIFILM TEARS ?Place 1 drop into both eyes 3 (three) times daily. ?  ?simvastatin 40 MG tablet ?Commonly known as: ZOCOR ?Take 20 mg by mouth every evening. ?  ?Vitamin D-3 25 MCG (1000 UT) Caps ?Take 1,000 Units by mouth daily with breakfast. ?  ? ?  ? ? Follow-up Information   ? ? Hoyt Koch, MD Follow up in 1 week(s).   ?Specialty: Internal Medicine ?Contact information: ?[facility]  [place] [address] ?[phone] ? ? ?  ?  ? ?  ?  ? ?  ? ?Discharge Exam: ?Filed Weights  ? 08/13/21 0854  ?Weight: 113.3 kg  ? ? ?General exam: Appears comfortable, not in any acute distress. ?Respiratory system: CTA bilaterally, no wheezing, no crackles. ?Cardiovascular system: S1-S2 heard, regular rate and rhythm, no murmur. ?Gastrointestinal system: Abdomen is soft, nontender, nondistended, BS+. ?Central nervous system: Alert, oriented x 3, no focal neurological deficits. ?Extremities: No edema, no cyanosis, no clubbing. ?Psychiatry: Mood, insight, judgment normal. ? ? ?Condition at discharge: good ? ?The results of significant diagnostics from this hospitalization (including imaging, microbiology, ancillary and laboratory) are listed below for reference.  ? ?Imaging Studies: ?DG Chest 2 View ? ?Result Date: 08/12/2021 ?CLINICAL DATA:  Suspected Sepsis  EXAM: CHEST - 2 VIEW COMPARISON:  Chest radiograph Sep 25, 2014. FINDINGS: No consolidation. No visible pleural effusions or pneumothorax. Cardiomediastinal silhouette is mildly enlarged, similar. No evidence of

## 2021-08-16 NOTE — Plan of Care (Signed)
Pt for discharge this date.  ?AVS printed and reviewed with patient at bedside.  ?New medications and follow ups discussed in detail.  ?All questions addressed, verbalized understanding. ?Friend/family member to transport home this afternoon, she is working until 330 per pt report.  ?Pt is not discharge lounge appropriate r/t fall risk and age.  ? ?Problem: Education: ?Goal: Knowledge of General Education information will improve ?Description: Including pain rating scale, medication(s)/side effects and non-pharmacologic comfort measures ?08/16/2021 1004 by Rutherford Guys, RN ?Outcome: Adequate for Discharge ?  ?Problem: Clinical Measurements: ?Goal: Ability to maintain clinical measurements within normal limits will improve ?08/16/2021 1004 by Rutherford Guys, RN ?Outcome: Adequate for Discharge ?  ?Problem: Clinical Measurements: ?Goal: Diagnostic test results will improve ?08/16/2021 1004 by Rutherford Guys, RN ?Outcome: Adequate for Discharge ?  ?Problem: Activity: ?Goal: Risk for activity intolerance will decrease ?08/16/2021 1004 by Rutherford Guys, RN ?Outcome: Adequate for Discharge ?  ?Problem: Safety: ?Goal: Ability to remain free from injury will improve ?08/16/2021 1004 by Rutherford Guys, RN ?Outcome: Adequate for Discharge ?  ?

## 2021-08-17 ENCOUNTER — Telehealth: Payer: Self-pay

## 2021-08-17 ENCOUNTER — Telehealth: Payer: Self-pay | Admitting: *Deleted

## 2021-08-17 NOTE — Telephone Encounter (Signed)
Transition Care Management Follow-up Telephone Call ?Date of discharge and from where: [place] 08-16-21 Dx: Bacteremia ?How have you been since you were released from the hospital? Just weak  ?Any questions or concerns? No ? ?Items Reviewed: ?Did the pt receive and understand the discharge instructions provided? Yes  ?Medications obtained and verified? Yes  ?Other? No  ?Any new allergies since your discharge? No  ?Dietary orders reviewed? Yes ?Do you have support at home? Yes  ? ?Home Care and Equipment/Supplies: ?Were home health services ordered? no ?If so, what is the name of the agency? na  ?Has the agency set up a time to come to the patient's home? not applicable ?Were any new equipment or medical supplies ordered?  No ?What is the name of the medical supply agency? na ?Were you able to get the supplies/equipment? not applicable ?Do you have any questions related to the use of the equipment or supplies? No ? ?Functional Questionnaire: (I = Independent and D = Dependent) ?ADLs: I ? ?Bathing/Dressing- I ? ?Meal Prep- I ? ?Eating- I ? ?Maintaining continence- I ? ?Transferring/Ambulation- I ? ?Managing Meds- I ? ?Follow up appointments reviewed: ? ?PCP Hospital f/u appt confirmed? Yes  Scheduled to see Dr Sharlet Salina on 08-19-21 @ 1040am. ?[facility] f/u appt confirmed? No . ?Are transportation arrangements needed? No  ?If their condition worsens, is the pt aware to call PCP or go to the Emergency Dept.? Yes ?Was the patient provided with contact information for the PCP's office or ED? Yes ?Was to pt encouraged to call back with questions or concerns? Yes  ?

## 2021-08-17 NOTE — Telephone Encounter (Signed)
Post ED Visit - Positive Culture Follow-up ? ?Culture report reviewed by antimicrobial stewardship pharmacist: ?[place] Team ?'[]'$  Elenor Quinones, Pharm.D. ?'[]'$  Heide Guile, Pharm.D., BCPS AQ-ID ?'[]'$  Parks Neptune, Pharm.D., BCPS ?'[]'$  Alycia Rossetti, Pharm.D., BCPS ?'[]'$  [place], Pharm.D., BCPS, AAHIVP ?'[]'$  Legrand Como, Pharm.D., BCPS, AAHIVP ?'[]'$  Salome Arnt, PharmD, BCPS ?'[]'$  Johnnette Gourd, PharmD, BCPS ?'[]'$  Hughes Better, PharmD, BCPS ?'[]'$  Leeroy Cha, PharmD ?'[]'$  Laqueta Linden, PharmD, BCPS ?'[]'$  Albertina Parr, PharmD ? ?[place] Team ?'[]'$  Leodis Sias, PharmD ?'[]'$  Lindell Spar, PharmD ?'[]'$  Royetta Asal, PharmD ?'[]'$  Graylin Shiver, Rph ?'[]'$  Rema Fendt) Glennon Mac, PharmD ?'[]'$  Arlyn Dunning, PharmD ?'[]'$  Netta Cedars, PharmD ?'[]'$  Dia Sitter, PharmD ?'[]'$  Leone Haven, PharmD ?'[]'$  Gretta Arab, PharmD ?'[]'$  Theodis Shove, PharmD ?'[]'$  Peggyann Juba, PharmD ?'[]'$  Reuel Boom, PharmD ? ? ?Positive blood culture ?Treated with Cefadroxil, organism sensitive to the same and no further patient follow-up is required at this time.  Jimmy Footman, Pharm D ? ?Ardeen Fillers ?08/17/2021, 9:14 AM ?  ?

## 2021-08-19 ENCOUNTER — Encounter: Payer: Self-pay | Admitting: Internal Medicine

## 2021-08-19 ENCOUNTER — Ambulatory Visit (INDEPENDENT_AMBULATORY_CARE_PROVIDER_SITE_OTHER): Payer: Medicare Other | Admitting: Internal Medicine

## 2021-08-19 VITALS — BP 130/68 | HR 59 | Resp 18 | Ht 71.0 in | Wt 245.0 lb

## 2021-08-19 DIAGNOSIS — R7881 Bacteremia: Secondary | ICD-10-CM | POA: Diagnosis not present

## 2021-08-19 DIAGNOSIS — R5383 Other fatigue: Secondary | ICD-10-CM | POA: Diagnosis not present

## 2021-08-19 DIAGNOSIS — D509 Iron deficiency anemia, unspecified: Secondary | ICD-10-CM | POA: Diagnosis not present

## 2021-08-19 DIAGNOSIS — N179 Acute kidney failure, unspecified: Secondary | ICD-10-CM

## 2021-08-19 LAB — COMPREHENSIVE METABOLIC PANEL
ALT: 34 U/L (ref 0–53)
AST: 29 U/L (ref 0–37)
Albumin: 3.9 g/dL (ref 3.5–5.2)
Alkaline Phosphatase: 97 U/L (ref 39–117)
BUN: 22 mg/dL (ref 6–23)
CO2: 26 mEq/L (ref 19–32)
Calcium: 9.6 mg/dL (ref 8.4–10.5)
Chloride: 99 mEq/L (ref 96–112)
Creatinine, Ser: 1.71 mg/dL — ABNORMAL HIGH (ref 0.40–1.50)
GFR: 35.9 mL/min — ABNORMAL LOW (ref >60.00)
Glucose, Bld: 203 mg/dL — ABNORMAL HIGH (ref 70–99)
Potassium: 4.5 mEq/L (ref 3.5–5.1)
Sodium: 134 mEq/L — ABNORMAL LOW (ref 135–145)
Total Bilirubin: 0.5 mg/dL (ref 0.2–1.2)
Total Protein: 7.4 g/dL (ref 6.0–8.3)

## 2021-08-19 LAB — CBC
HCT: 26.8 % — ABNORMAL LOW (ref 39.0–52.0)
Hemoglobin: 8.6 g/dL — ABNORMAL LOW (ref 13.0–17.0)
MCHC: 31.9 g/dL (ref 30.0–36.0)
MCV: 82.7 fl (ref 78.0–100.0)
Platelets: 302 10*3/uL (ref 150.0–400.0)
RBC: 3.24 Mil/uL — ABNORMAL LOW (ref 4.22–5.81)
RDW: 16.4 % — ABNORMAL HIGH (ref 11.5–15.5)
WBC: 7.7 10*3/uL (ref 4.0–10.5)

## 2021-08-19 NOTE — Assessment & Plan Note (Addendum)
Checking CBC and CMP today to assess kidney and liver function. He did have slight worsening of anemia and leukocytosis in the hospital. Continue cefadroxil total course of 10 days which is another 4 days.  ?

## 2021-08-19 NOTE — Assessment & Plan Note (Signed)
Checking CMP today, did have this in hospital likely related to his bacteremia.  ?

## 2021-08-19 NOTE — Assessment & Plan Note (Signed)
Blood counts were down in the hospital likely due to critical illness. No clinical signs of bleeding on exam today. Checking CBC for stability. He is taking iron already.  ?

## 2021-08-19 NOTE — Patient Instructions (Signed)
We will check the labs today. ? ?We will get home health to the house and call the [facility] to see if they can help long term with care for you. ?

## 2021-08-19 NOTE — Progress Notes (Signed)
? ?  Subjective:  ? ?Patient ID: [name] [name], male    DOB: 1933-12-17, 88 y.o.   MRN: [id number] ? ?HPI ?The patient is an 88 YO man coming in for hospital follow up (admitted to [facility] for bacteremia and treated for UTI acquired bacteremia on antibiotics still). He is feeling improved from before. Is still taking his cefadoxil for antibiotics and denies side effects.  ? ?PMH, [facility], social history reviewed and updated ? ?Review of Systems  ?Constitutional:  Positive for activity change, appetite change and fatigue.  ?HENT: Negative.    ?Eyes: Negative.   ?Respiratory:  Negative for cough, chest tightness and shortness of breath.   ?Cardiovascular:  Negative for chest pain, palpitations and leg swelling.  ?Gastrointestinal:  Negative for abdominal distention, abdominal pain, constipation, diarrhea, nausea and vomiting.  ?Musculoskeletal:  Positive for arthralgias, gait problem and myalgias. Negative for joint swelling.  ?Skin: Negative.   ?Psychiatric/Behavioral: Negative.    ? ?Objective:  ?Physical Exam ?Constitutional:   ?   Appearance: He is well-developed. He is obese. He is ill-appearing.  ?HENT:  ?   Head: Normocephalic and atraumatic.  ?Cardiovascular:  ?   Rate and Rhythm: Normal rate and regular rhythm.  ?Pulmonary:  ?   Effort: Pulmonary effort is normal. No respiratory distress.  ?   Breath sounds: Normal breath sounds. No wheezing or rales.  ?Abdominal:  ?   General: Bowel sounds are normal. There is no distension.  ?   Palpations: Abdomen is soft.  ?   Tenderness: There is no abdominal tenderness. There is no rebound.  ?Musculoskeletal:  ?   Cervical back: Normal range of motion.  ?Skin: ?   General: Skin is warm and dry.  ?Neurological:  ?   Mental Status: He is alert and oriented to person, place, and time.  ?   Coordination: Coordination abnormal.  ?   Comments: Cane, slow gait and unsteady  ? ? ?Vitals:  ? 08/19/21 1043  ?BP: 130/68  ?Pulse: (!) 59  ?Resp: 18  ?SpO2: 98%  ?Weight: 245 lb (111.1 kg)   ?Height: '5\' 11"'$  (1.803 m)  ? ? ?This visit occurred during the SARS-CoV-2 public health emergency.  Safety protocols were in place, including screening questions prior to the visit, additional usage of staff PPE, and extensive cleaning of exam room while observing appropriate contact time as indicated for disinfecting solutions.  ? ?Assessment & Plan:  ? ?

## 2021-08-19 NOTE — Assessment & Plan Note (Signed)
Still moderate and has improved slightly with treatment of his bacteremia.  ?

## 2021-10-19 ENCOUNTER — Encounter: Payer: Self-pay | Admitting: Podiatry

## 2021-10-19 ENCOUNTER — Ambulatory Visit (INDEPENDENT_AMBULATORY_CARE_PROVIDER_SITE_OTHER): Payer: Medicare Other | Admitting: Podiatry

## 2021-10-19 DIAGNOSIS — B351 Tinea unguium: Secondary | ICD-10-CM | POA: Diagnosis not present

## 2021-10-19 DIAGNOSIS — E119 Type 2 diabetes mellitus without complications: Secondary | ICD-10-CM | POA: Diagnosis not present

## 2021-10-19 DIAGNOSIS — M79676 Pain in unspecified toe(s): Secondary | ICD-10-CM

## 2021-10-19 NOTE — Progress Notes (Signed)
This patient returns to my office for at risk foot care.  This patient requires this care by a professional since this patient will be at risk due to having type 1 diabetes.   This patient is unable to cut nails himself since the patient cannot reach his nails.These nails are painful walking and wearing shoes.  This patient presents for at risk foot care today.  General Appearance  Alert, conversant and in no acute stress.  Vascular  Dorsalis pedis and posterior tibial  pulses are palpable  bilaterally.  Capillary return is within normal limits  bilaterally. Temperature is within normal limits  bilaterally.  Neurologic  Senn-Weinstein monofilament wire test within normal limits  bilaterally. Muscle power within normal limits bilaterally.  Nails Thick disfigured discolored nails with subungual debris  from hallux to fifth toes bilaterally. No evidence of bacterial infection or drainage bilaterally.  Orthopedic  No limitations of motion  feet .  No crepitus or effusions noted.  No bony pathology or digital deformities noted.  Skin  normotropic skin with no porokeratosis noted bilaterally.  No signs of infections or ulcers noted.     Onychomycosis  Pain in right toes  Pain in left toes  Consent was obtained for treatment procedures.   Mechanical debridement of nails 1-5  bilaterally performed with a nail nipper.  Filed with dremel without incident.    Return office visit    12 weeks                  Told patient to return for periodic foot care and evaluation due to potential at risk complications.   Keiarra Charon DPM  

## 2021-11-22 ENCOUNTER — Encounter (INDEPENDENT_AMBULATORY_CARE_PROVIDER_SITE_OTHER): Payer: Medicare Other | Admitting: Ophthalmology

## 2021-11-22 DIAGNOSIS — H43813 Vitreous degeneration, bilateral: Secondary | ICD-10-CM | POA: Diagnosis not present

## 2021-11-22 DIAGNOSIS — I10 Essential (primary) hypertension: Secondary | ICD-10-CM | POA: Diagnosis not present

## 2021-11-22 DIAGNOSIS — H35033 Hypertensive retinopathy, bilateral: Secondary | ICD-10-CM

## 2021-11-22 DIAGNOSIS — E113593 Type 2 diabetes mellitus with proliferative diabetic retinopathy without macular edema, bilateral: Secondary | ICD-10-CM | POA: Diagnosis not present

## 2021-12-02 ENCOUNTER — Encounter (INDEPENDENT_AMBULATORY_CARE_PROVIDER_SITE_OTHER): Payer: Medicare Other | Admitting: Ophthalmology

## 2021-12-02 DIAGNOSIS — E113592 Type 2 diabetes mellitus with proliferative diabetic retinopathy without macular edema, left eye: Secondary | ICD-10-CM | POA: Diagnosis not present

## 2021-12-17 DIAGNOSIS — Z8551 Personal history of malignant neoplasm of bladder: Secondary | ICD-10-CM | POA: Diagnosis not present

## 2021-12-17 DIAGNOSIS — N189 Chronic kidney disease, unspecified: Secondary | ICD-10-CM | POA: Diagnosis not present

## 2021-12-17 DIAGNOSIS — N5201 Erectile dysfunction due to arterial insufficiency: Secondary | ICD-10-CM | POA: Diagnosis not present

## 2021-12-17 DIAGNOSIS — N281 Cyst of kidney, acquired: Secondary | ICD-10-CM | POA: Diagnosis not present

## 2022-01-03 ENCOUNTER — Telehealth: Payer: Self-pay | Admitting: Internal Medicine

## 2022-01-03 ENCOUNTER — Other Ambulatory Visit: Payer: Self-pay | Admitting: Internal Medicine

## 2022-01-03 ENCOUNTER — Other Ambulatory Visit: Payer: Self-pay

## 2022-01-03 MED ORDER — FREESTYLE LITE TEST VI STRP: ORAL_STRIP | 12 refills | Status: AC

## 2022-01-03 NOTE — Telephone Encounter (Signed)
Pt is requesting rx for test strips. He said he has not tested his glucose in 7 days because he does not have any test strips.    Please advise

## 2022-01-03 NOTE — Telephone Encounter (Signed)
Spoke with patient today. He states Freestyle Libre Freedom Susa Day was ordered through [place] but they did not send him any testing strips and he asked if we could send some in until his shipment arrives which is going to take several weeks. Informed patient I would send in one box to hold him over but that he should be using his sensors in place of the testing strips so he doesn't have to continue to stick himself everyday.

## 2022-01-19 ENCOUNTER — Ambulatory Visit (INDEPENDENT_AMBULATORY_CARE_PROVIDER_SITE_OTHER): Payer: Medicare Other | Admitting: Podiatry

## 2022-01-19 ENCOUNTER — Encounter: Payer: Self-pay | Admitting: Podiatry

## 2022-01-19 DIAGNOSIS — E119 Type 2 diabetes mellitus without complications: Secondary | ICD-10-CM

## 2022-01-19 DIAGNOSIS — M79676 Pain in unspecified toe(s): Secondary | ICD-10-CM | POA: Diagnosis not present

## 2022-01-19 DIAGNOSIS — B351 Tinea unguium: Secondary | ICD-10-CM | POA: Diagnosis not present

## 2022-01-19 DIAGNOSIS — N179 Acute kidney failure, unspecified: Secondary | ICD-10-CM

## 2022-01-19 DIAGNOSIS — N1832 Chronic kidney disease, stage 3b: Secondary | ICD-10-CM

## 2022-01-19 NOTE — Progress Notes (Signed)
This patient returns to my office for at risk foot care.  This patient requires this care by a professional since this patient will be at risk due to having type 1 diabetes.   This patient is unable to cut nails himself since the patient cannot reach his nails.These nails are painful walking and wearing shoes.  This patient presents for at risk foot care today.  General Appearance  Alert, conversant and in no acute stress.  Vascular  Dorsalis pedis and posterior tibial  pulses are palpable  bilaterally.  Capillary return is within normal limits  bilaterally. Temperature is within normal limits  bilaterally.  Neurologic  Senn-Weinstein monofilament wire test within normal limits  bilaterally. Muscle power within normal limits bilaterally.  Nails Thick disfigured discolored nails with subungual debris  from hallux to fifth toes bilaterally. No evidence of bacterial infection or drainage bilaterally.  Orthopedic  No limitations of motion  feet .  No crepitus or effusions noted.  No bony pathology or digital deformities noted.  Skin  normotropic skin with no porokeratosis noted bilaterally.  No signs of infections or ulcers noted.     Onychomycosis  Pain in right toes  Pain in left toes  Consent was obtained for treatment procedures.   Mechanical debridement of nails 1-5  bilaterally performed with a nail nipper.  Filed with dremel without incident.    Return office visit    12 weeks                  Told patient to return for periodic foot care and evaluation due to potential at risk complications.   Gardiner Barefoot DPM

## 2022-02-22 ENCOUNTER — Ambulatory Visit (INDEPENDENT_AMBULATORY_CARE_PROVIDER_SITE_OTHER): Payer: Medicare Other | Admitting: *Deleted

## 2022-02-22 DIAGNOSIS — Z23 Encounter for immunization: Secondary | ICD-10-CM

## 2022-02-22 NOTE — Progress Notes (Signed)
Administered high dose flu shot left deltoid, pt tolerated well.

## 2022-03-03 ENCOUNTER — Telehealth: Payer: Self-pay | Admitting: Internal Medicine

## 2022-03-03 NOTE — Telephone Encounter (Signed)
Left message for patient to call back and schedule Medicare Annual Wellness Visit (AWV).   Please offer to do virtually or by telephone.  Left office number and my jabber [phone].  Last AWV:09/25/2014  Please schedule at anytime with Nurse Health Advisor.

## 2022-03-10 ENCOUNTER — Encounter (INDEPENDENT_AMBULATORY_CARE_PROVIDER_SITE_OTHER): Payer: Medicare Other | Admitting: Ophthalmology

## 2022-03-10 DIAGNOSIS — E113593 Type 2 diabetes mellitus with proliferative diabetic retinopathy without macular edema, bilateral: Secondary | ICD-10-CM | POA: Diagnosis not present

## 2022-03-10 DIAGNOSIS — H43813 Vitreous degeneration, bilateral: Secondary | ICD-10-CM | POA: Diagnosis not present

## 2022-03-10 DIAGNOSIS — H35033 Hypertensive retinopathy, bilateral: Secondary | ICD-10-CM

## 2022-03-10 DIAGNOSIS — I10 Essential (primary) hypertension: Secondary | ICD-10-CM | POA: Diagnosis not present

## 2022-03-10 DIAGNOSIS — H4311 Vitreous hemorrhage, right eye: Secondary | ICD-10-CM | POA: Diagnosis not present

## 2022-03-22 ENCOUNTER — Telehealth: Payer: Self-pay | Admitting: Internal Medicine

## 2022-03-22 NOTE — Telephone Encounter (Signed)
Left message for patient to call back to schedule Medicare Annual Wellness Visit   Last AWV  09/25/14  Please schedule at anytime with [facility] if patient calls the office back.      Any questions, please call me at [phone]

## 2022-04-04 ENCOUNTER — Encounter (INDEPENDENT_AMBULATORY_CARE_PROVIDER_SITE_OTHER): Payer: Medicare Other | Admitting: Ophthalmology

## 2022-04-14 ENCOUNTER — Encounter (INDEPENDENT_AMBULATORY_CARE_PROVIDER_SITE_OTHER): Payer: Medicare Other | Admitting: Ophthalmology

## 2022-04-20 ENCOUNTER — Ambulatory Visit (INDEPENDENT_AMBULATORY_CARE_PROVIDER_SITE_OTHER): Payer: Medicare Other | Admitting: Podiatry

## 2022-04-20 ENCOUNTER — Encounter: Payer: Self-pay | Admitting: Podiatry

## 2022-04-20 VITALS — BP 182/82

## 2022-04-20 DIAGNOSIS — B351 Tinea unguium: Secondary | ICD-10-CM | POA: Diagnosis not present

## 2022-04-20 DIAGNOSIS — E119 Type 2 diabetes mellitus without complications: Secondary | ICD-10-CM | POA: Diagnosis not present

## 2022-04-20 DIAGNOSIS — N1832 Chronic kidney disease, stage 3b: Secondary | ICD-10-CM

## 2022-04-20 DIAGNOSIS — M79676 Pain in unspecified toe(s): Secondary | ICD-10-CM | POA: Diagnosis not present

## 2022-04-20 DIAGNOSIS — N179 Acute kidney failure, unspecified: Secondary | ICD-10-CM

## 2022-04-20 NOTE — Progress Notes (Signed)
This patient returns to my office for at risk foot care.  This patient requires this care by a professional since this patient will be at risk due to having type 1 diabetes.   This patient is unable to cut nails himself since the patient cannot reach his nails.These nails are painful walking and wearing shoes.  This patient presents for at risk foot care today.  General Appearance  Alert, conversant and in no acute stress.  Vascular  Dorsalis pedis and posterior tibial  pulses are palpable  bilaterally.  Capillary return is within normal limits  bilaterally. Temperature is within normal limits  bilaterally.  Neurologic  Senn-Weinstein monofilament wire test within normal limits  bilaterally. Muscle power within normal limits bilaterally.  Nails Thick disfigured discolored nails with subungual debris  from hallux to fifth toes bilaterally. No evidence of bacterial infection or drainage bilaterally.  Orthopedic  No limitations of motion  feet .  No crepitus or effusions noted.  No bony pathology or digital deformities noted. Palpable pain left heel.  Skin  normotropic skin with no porokeratosis noted bilaterally.  No signs of infections or ulcers noted.     Onychomycosis  Pain in right toes  Pain in left toes  Consent was obtained for treatment procedures.   Mechanical debridement of nails 1-5  bilaterally performed with a nail nipper.  Filed with dremel without incident.  Told him to use pillow behind his left heel.   Return office visit    12 weeks                  Told patient to return for periodic foot care and evaluation due to potential at risk complications.   Gardiner Barefoot DPM

## 2022-06-03 ENCOUNTER — Ambulatory Visit: Payer: Medicare Other | Admitting: Podiatry

## 2022-07-13 ENCOUNTER — Ambulatory Visit (INDEPENDENT_AMBULATORY_CARE_PROVIDER_SITE_OTHER): Payer: Medicare Other | Admitting: Podiatry

## 2022-07-13 DIAGNOSIS — B353 Tinea pedis: Secondary | ICD-10-CM

## 2022-07-13 DIAGNOSIS — B351 Tinea unguium: Secondary | ICD-10-CM | POA: Diagnosis not present

## 2022-07-13 DIAGNOSIS — M79676 Pain in unspecified toe(s): Secondary | ICD-10-CM | POA: Diagnosis not present

## 2022-07-13 MED ORDER — CLOTRIMAZOLE-BETAMETHASONE 1-0.05 % EX CREA: 1.0000 | TOPICAL_CREAM | Freq: Every day | CUTANEOUS | 0 refills | Status: DC

## 2022-07-14 ENCOUNTER — Encounter: Payer: Self-pay | Admitting: Podiatry

## 2022-07-14 NOTE — Progress Notes (Signed)
  Subjective:  Patient ID: [name] [name], male    DOB: [birth year],  MRN: [id number]  Chief Complaint  Patient presents with   Foot Pain    Left heel pain - skin is cracked around the left heel   Callouses   Nail Problem    Thick painful toenails, 3 month follow up    89 y.o. male presents with the above complaint. History confirmed with patient.  Nails are thickened elongated and uncomfortable causing pain in shoe gear  Objective:  Physical Exam: warm, good capillary refill, no trophic changes or ulcerative lesions, normal DP and PT pulses, normal sensory exam, and fissuring of bilateral heels with dry cracking peeling skin on both feet. Left Foot: dystrophic yellowed discolored nail plates with subungual debris Right Foot: dystrophic yellowed discolored nail plates with subungual debris   Assessment:   1. Pain due to onychomycosis of toenail   2. Tinea pedis of both feet      Plan:  Patient was evaluated and treated and all questions answered.   Discussed the etiology and treatment options for the condition in detail with the patient. Educated patient on the topical and oral treatment options for mycotic nails. Recommended debridement of the nails today. Sharp and mechanical debridement performed of all painful and mycotic nails today. Nails debrided in length and thickness using a nail nipper to level of comfort. Discussed treatment options including appropriate shoe gear. Follow up as needed for painful nails.  Discussed the etiology and treatment options for tinea pedis.  Discussed topical and oral treatment.  Recommended topical treatment with Lotrisone cream.  This was sent to the patient's pharmacy.  Also discussed appropriate foot hygiene, use of antifungal spray such as Tinactin in shoes, as well as cleaning her foot surfaces such as showers and bathroom floors with bleach.   Return in about 3 months (around 10/11/2022) for painful thick fungal nails.

## 2022-07-25 ENCOUNTER — Ambulatory Visit: Payer: Medicare Other | Admitting: Podiatry

## 2022-07-25 ENCOUNTER — Telehealth: Payer: Self-pay

## 2022-07-25 NOTE — Telephone Encounter (Signed)
Contacted [name] [name] to schedule their annual wellness visit. Appointment made for 08/02/22.  Norton Blizzard, [place] ([facility])  [place] [facility] [phone]

## 2022-07-29 ENCOUNTER — Ambulatory Visit: Payer: Medicare Other | Admitting: Podiatry

## 2022-08-08 ENCOUNTER — Ambulatory Visit: Payer: Medicare Other | Admitting: Podiatry

## 2022-08-29 DIAGNOSIS — Z936 Other artificial openings of urinary tract status: Secondary | ICD-10-CM | POA: Diagnosis not present

## 2022-08-29 DIAGNOSIS — R7881 Bacteremia: Secondary | ICD-10-CM | POA: Diagnosis not present

## 2022-08-29 DIAGNOSIS — R8271 Bacteriuria: Secondary | ICD-10-CM | POA: Diagnosis not present

## 2022-09-13 DIAGNOSIS — N390 Urinary tract infection, site not specified: Secondary | ICD-10-CM | POA: Diagnosis not present

## 2022-09-13 DIAGNOSIS — B9689 Other specified bacterial agents as the cause of diseases classified elsewhere: Secondary | ICD-10-CM | POA: Diagnosis not present

## 2022-09-13 DIAGNOSIS — K9410 Enterostomy complication, unspecified: Secondary | ICD-10-CM | POA: Diagnosis not present

## 2022-10-12 ENCOUNTER — Ambulatory Visit (INDEPENDENT_AMBULATORY_CARE_PROVIDER_SITE_OTHER): Payer: Medicare Other | Admitting: Podiatry

## 2022-10-12 ENCOUNTER — Encounter: Payer: Self-pay | Admitting: Podiatry

## 2022-10-12 DIAGNOSIS — E119 Type 2 diabetes mellitus without complications: Secondary | ICD-10-CM

## 2022-10-12 DIAGNOSIS — B351 Tinea unguium: Secondary | ICD-10-CM | POA: Diagnosis not present

## 2022-10-12 DIAGNOSIS — M79676 Pain in unspecified toe(s): Secondary | ICD-10-CM | POA: Diagnosis not present

## 2022-10-12 NOTE — Progress Notes (Signed)
This patient returns to my office for at risk foot care.  This patient requires this care by a professional since this patient will be at risk due to having type 1 diabetes.   This patient is unable to cut nails himself since the patient cannot reach his nails.These nails are painful walking and wearing shoes.  This patient presents for at risk foot care today.  General Appearance  Alert, conversant and in no acute stress.  Vascular  Dorsalis pedis and posterior tibial  pulses are palpable  bilaterally.  Capillary return is within normal limits  bilaterally. Temperature is within normal limits  bilaterally.  Neurologic  Senn-Weinstein monofilament wire test within normal limits  bilaterally. Muscle power within normal limits bilaterally.  Nails Thick disfigured discolored nails with subungual debris  from hallux to fifth toes bilaterally. No evidence of bacterial infection or drainage bilaterally.  Orthopedic  No limitations of motion  feet .  No crepitus or effusions noted.  No bony pathology or digital deformities noted. Palpable pain left heel.  Skin  normotropic skin with no porokeratosis noted bilaterally.  No signs of infections or ulcers noted.     Onychomycosis  Pain in right toes  Pain in left toes  Consent was obtained for treatment procedures.   Mechanical debridement of nails 1-5  bilaterally performed with a nail nipper.  Filed with dremel without incident.  Patient has healing skin laceration fifth toe left foot.   Return office visit    12 weeks                  Told patient to return for periodic foot care and evaluation due to potential at risk complications.   Helane Gunther DPM

## 2022-12-29 DIAGNOSIS — N281 Cyst of kidney, acquired: Secondary | ICD-10-CM | POA: Diagnosis not present

## 2022-12-29 DIAGNOSIS — K9410 Enterostomy complication, unspecified: Secondary | ICD-10-CM | POA: Diagnosis not present

## 2022-12-29 DIAGNOSIS — Z8551 Personal history of malignant neoplasm of bladder: Secondary | ICD-10-CM | POA: Diagnosis not present

## 2023-01-11 ENCOUNTER — Ambulatory Visit: Payer: Medicare Other | Admitting: Podiatry

## 2023-01-11 ENCOUNTER — Encounter: Payer: Self-pay | Admitting: Podiatry

## 2023-01-11 DIAGNOSIS — B351 Tinea unguium: Secondary | ICD-10-CM | POA: Diagnosis not present

## 2023-01-11 DIAGNOSIS — M79676 Pain in unspecified toe(s): Secondary | ICD-10-CM

## 2023-01-11 DIAGNOSIS — B353 Tinea pedis: Secondary | ICD-10-CM | POA: Diagnosis not present

## 2023-01-11 DIAGNOSIS — E119 Type 2 diabetes mellitus without complications: Secondary | ICD-10-CM

## 2023-01-11 MED ORDER — CLOTRIMAZOLE-BETAMETHASONE 1-0.05 % EX CREA: 1.0000 | TOPICAL_CREAM | Freq: Every day | CUTANEOUS | 0 refills | Status: DC

## 2023-01-11 NOTE — Progress Notes (Signed)
This patient returns to my office for at risk foot care.  This patient requires this care by a professional since this patient will be at risk due to having type 1 diabetes.   This patient is unable to cut nails himself since the patient cannot reach his nails.These nails are painful walking and wearing shoes.  This patient presents for at risk foot care today.  General Appearance  Alert, conversant and in no acute stress.  Vascular  Dorsalis pedis and posterior tibial  pulses are palpable  bilaterally.  Capillary return is within normal limits  bilaterally. Temperature is within normal limits  bilaterally.  Neurologic  Senn-Weinstein monofilament wire test within normal limits  bilaterally. Muscle power within normal limits bilaterally.  Nails Thick disfigured discolored nails with subungual debris  from hallux to fifth toes bilaterally. No evidence of bacterial infection or drainage bilaterally.  Orthopedic  No limitations of motion  feet .  No crepitus or effusions noted.  No bony pathology or digital deformities noted  Skin  normotropic skin with no porokeratosis noted bilaterally.  No signs of infections or ulcers noted.     Onychomycosis  Pain in right toes  Pain in left toes  Consent was obtained for treatment procedures.   Mechanical debridement of nails 1-5  bilaterally performed with a nail nipper.  Filed with dremel without incident.  Patient requests lotrisone for his tinea pedis.   Return office visit    12 weeks                  Told patient to return for periodic foot care and evaluation due to potential at risk complications.   Helane Gunther DPM

## 2023-01-26 LAB — HEMOGLOBIN A1C: Hemoglobin A1C: 8.9

## 2023-03-06 ENCOUNTER — Ambulatory Visit: Payer: Medicare Other | Admitting: Internal Medicine

## 2023-03-06 ENCOUNTER — Encounter: Payer: Self-pay | Admitting: Internal Medicine

## 2023-03-06 VITALS — BP 160/86 | HR 83 | Temp 98.6°F | Ht 71.0 in | Wt 250.0 lb

## 2023-03-06 DIAGNOSIS — N1832 Chronic kidney disease, stage 3b: Secondary | ICD-10-CM

## 2023-03-06 DIAGNOSIS — I10 Essential (primary) hypertension: Secondary | ICD-10-CM

## 2023-03-06 DIAGNOSIS — E782 Mixed hyperlipidemia: Secondary | ICD-10-CM

## 2023-03-06 DIAGNOSIS — D509 Iron deficiency anemia, unspecified: Secondary | ICD-10-CM | POA: Diagnosis not present

## 2023-03-06 DIAGNOSIS — E1069 Type 1 diabetes mellitus with other specified complication: Secondary | ICD-10-CM

## 2023-03-06 DIAGNOSIS — Z23 Encounter for immunization: Secondary | ICD-10-CM

## 2023-03-06 LAB — COMPREHENSIVE METABOLIC PANEL
ALT: 10 U/L (ref 0–53)
AST: 16 U/L (ref 0–37)
Albumin: 4 g/dL (ref 3.5–5.2)
Alkaline Phosphatase: 71 U/L (ref 39–117)
BUN: 20 mg/dL (ref 6–23)
CO2: 23 meq/L (ref 19–32)
Calcium: 9.7 mg/dL (ref 8.4–10.5)
Chloride: 108 meq/L (ref 96–112)
Creatinine, Ser: 1.66 mg/dL — ABNORMAL HIGH (ref 0.40–1.50)
GFR: 36.8 mL/min — ABNORMAL LOW (ref >60.00)
Glucose, Bld: 131 mg/dL — ABNORMAL HIGH (ref 70–99)
Potassium: 4.2 meq/L (ref 3.5–5.1)
Sodium: 139 meq/L (ref 135–145)
Total Bilirubin: 0.4 mg/dL (ref 0.2–1.2)
Total Protein: 7.5 g/dL (ref 6.0–8.3)

## 2023-03-06 LAB — MAGNESIUM: Magnesium: 1.4 mg/dL — ABNORMAL LOW (ref 1.5–2.5)

## 2023-03-06 NOTE — Progress Notes (Signed)
Subjective:   Patient ID: [name] [name], male    DOB: 08-29-1933, 89 y.o.   MRN: [id number]  HPI The patient is an 89 YO man coming in for follow up. Gets labs and most care through [place].  Review of Systems  Constitutional: Negative.   HENT: Negative.    Eyes: Negative.   Respiratory:  Negative for cough, chest tightness and shortness of breath.   Cardiovascular:  Negative for chest pain, palpitations and leg swelling.  Gastrointestinal:  Negative for abdominal distention, abdominal pain, constipation, diarrhea, nausea and vomiting.  Musculoskeletal: Negative.   Skin: Negative.   Neurological: Negative.   Psychiatric/Behavioral: Negative.      Objective:  Physical Exam Constitutional:      Appearance: He is well-developed.  HENT:     Head: Normocephalic and atraumatic.  Cardiovascular:     Rate and Rhythm: Normal rate and regular rhythm.  Pulmonary:     Effort: Pulmonary effort is normal. No respiratory distress.     Breath sounds: Normal breath sounds. No wheezing or rales.  Abdominal:     General: Bowel sounds are normal. There is no distension.     Palpations: Abdomen is soft.     Tenderness: There is no abdominal tenderness. There is no rebound.  Musculoskeletal:     Cervical back: Normal range of motion.  Skin:    General: Skin is warm and dry.     Comments: Foot exam done  Neurological:     Mental Status: He is alert and oriented to person, place, and time.     Coordination: Coordination normal.     Vitals:   03/06/23 1338 03/06/23 1343  BP: (!) 160/86 (!) 160/86  Pulse: 83   Temp: 98.6 F (37 C)   TempSrc: Oral   SpO2: 97%   Weight: 250 lb (113.4 kg)   Height: 5\' 11"  (1.803 m)     Assessment & Plan:  Flu shot given at visit

## 2023-03-06 NOTE — Assessment & Plan Note (Signed)
Associated with type 1 diabetes and hypertension. Most recent labs from [place] stable and will continue to get there. Checking CMP today and magnesium for follow up. He is taking amlodipine 2.5 mg daily and losartan 50 mg BID and atenolol 25 mg daily.

## 2023-03-06 NOTE — Assessment & Plan Note (Signed)
BP mildly elevated which is acceptable for age and co-morbidities. He has had pre-syncope and syncope in the past with overcontrolled. His home readings for last month with average 130s/70s so likely actually controlled.

## 2023-03-06 NOTE — Assessment & Plan Note (Signed)
Stable blood counts. Prior poor reaction with both oral and IV iron. Will avoid iron products for now and encourage nutritional sources. No bleeding clinically.

## 2023-03-06 NOTE — Assessment & Plan Note (Signed)
Reviewed recent labs through [place] HgA1c 8.9 which is acceptable for age and complicated insulin regimen with daily and mealtime. He has had sugar 64 in review of log but he has not eaten as much that night before. Otherwise no lows and we discussed low blood sugar more dangerous for him that elevated readings.

## 2023-03-06 NOTE — Assessment & Plan Note (Addendum)
[facility] and at goal. Taking simvastatin 40 mg daily.

## 2023-03-06 NOTE — Patient Instructions (Signed)
We will check the labs today and have given you the flu shot. 

## 2023-04-19 ENCOUNTER — Ambulatory Visit (INDEPENDENT_AMBULATORY_CARE_PROVIDER_SITE_OTHER): Payer: Medicare Other | Admitting: Podiatry

## 2023-04-19 ENCOUNTER — Encounter: Payer: Self-pay | Admitting: Podiatry

## 2023-04-19 DIAGNOSIS — E119 Type 2 diabetes mellitus without complications: Secondary | ICD-10-CM | POA: Diagnosis not present

## 2023-04-19 DIAGNOSIS — B351 Tinea unguium: Secondary | ICD-10-CM | POA: Diagnosis not present

## 2023-04-19 DIAGNOSIS — M79676 Pain in unspecified toe(s): Secondary | ICD-10-CM | POA: Diagnosis not present

## 2023-04-19 NOTE — Progress Notes (Signed)
This patient returns to my office for at risk foot care.  This patient requires this care by a professional since this patient will be at risk due to having type 1 diabetes.   This patient is unable to cut nails himself since the patient cannot reach his nails.These nails are painful walking and wearing shoes.  This patient presents for at risk foot care today.  General Appearance  Alert, conversant and in no acute stress.  Vascular  Dorsalis pedis and posterior tibial  pulses are palpable  bilaterally.  Capillary return is within normal limits  bilaterally. Temperature is within normal limits  bilaterally.  Neurologic  Senn-Weinstein monofilament wire test within normal limits  bilaterally. Muscle power within normal limits bilaterally.  Nails Thick disfigured discolored nails with subungual debris  from hallux to fifth toes bilaterally. No evidence of bacterial infection or drainage bilaterally.  Orthopedic  No limitations of motion  feet .  No crepitus or effusions noted.  No bony pathology or digital deformities noted  Skin  normotropic skin with no porokeratosis noted bilaterally.  No signs of infections or ulcers noted.     Onychomycosis  Pain in right toes  Pain in left toes  Consent was obtained for treatment procedures.   Mechanical debridement of nails 1-5  bilaterally performed with a nail nipper.  Filed with dremel without incident.  Patient requests lotrisone for his tinea pedis.   Return office visit    12 weeks                  Told patient to return for periodic foot care and evaluation due to potential at risk complications.   Helane Gunther DPM

## 2023-06-06 ENCOUNTER — Telehealth: Payer: Self-pay

## 2023-06-06 NOTE — Progress Notes (Signed)
Care Guide Pharmacy Note  06/06/2023 Name: [name] [name] MRN: [id number] DOB: [birth year]  Referred By: Myrlene Broker, MD Reason for referral: Care Coordination ([facility] Diabetes.)   [name] [name] is a 89 y.o. year old male who is a primary care patient of Myrlene Broker, MD.  [name] [name] was referred to the pharmacist for assistance related to: DMII  Successful contact was made with the patient to discuss pharmacy services.  Patient declines engagement at this time. Contact information was provided to the patient should they wish to reach out for assistance at a later time.  [facility]  [facility], [facility] Health Care Management Assistant Direct Dial: [phone]  Fax: [phone]

## 2023-07-19 ENCOUNTER — Encounter: Payer: Self-pay | Admitting: Podiatry

## 2023-07-19 ENCOUNTER — Ambulatory Visit (INDEPENDENT_AMBULATORY_CARE_PROVIDER_SITE_OTHER): Payer: Medicare Other | Admitting: Podiatry

## 2023-07-19 DIAGNOSIS — B351 Tinea unguium: Secondary | ICD-10-CM | POA: Diagnosis not present

## 2023-07-19 DIAGNOSIS — M79676 Pain in unspecified toe(s): Secondary | ICD-10-CM

## 2023-07-19 DIAGNOSIS — E119 Type 2 diabetes mellitus without complications: Secondary | ICD-10-CM | POA: Diagnosis not present

## 2023-07-19 NOTE — Progress Notes (Signed)
This patient returns to my office for at risk foot care.  This patient requires this care by a professional since this patient will be at risk due to having type 1 diabetes.   This patient is unable to cut nails himself since the patient cannot reach his nails.These nails are painful walking and wearing shoes.  This patient presents for at risk foot care today.  General Appearance  Alert, conversant and in no acute stress.  Vascular  Dorsalis pedis and posterior tibial  pulses are palpable  bilaterally.  Capillary return is within normal limits  bilaterally. Temperature is within normal limits  bilaterally.  Neurologic  Senn-Weinstein monofilament wire test within normal limits  bilaterally. Muscle power within normal limits bilaterally.  Nails Thick disfigured discolored nails with subungual debris  from hallux to fifth toes bilaterally. No evidence of bacterial infection or drainage bilaterally.  Orthopedic  No limitations of motion  feet .  No crepitus or effusions noted.  No bony pathology or digital deformities noted.  Skin  normotropic skin with no porokeratosis noted bilaterally.  No signs of infections or ulcers noted.     Onychomycosis  Pain in right toes  Pain in left toes  Consent was obtained for treatment procedures.   Mechanical debridement of nails 1-5  bilaterally performed with a nail nipper.  Filed with dremel without incident.    Return office visit    12 weeks                  Told patient to return for periodic foot care and evaluation due to potential at risk complications.   Naomie Crow DPM  

## 2023-09-18 ENCOUNTER — Telehealth: Payer: Self-pay

## 2023-09-18 NOTE — Telephone Encounter (Signed)
 Patient was identified as falling into the [place] Measure - Diabetes.   Patient was: Left voicemail to schedule with primary care provider.

## 2023-09-28 ENCOUNTER — Telehealth: Payer: Self-pay

## 2023-09-28 NOTE — Telephone Encounter (Signed)
 Patient was identified as falling into the [place] Measure - Diabetes.   Patient was: Patient refuses intervention:  No reason given/other. Patient states he sees the [place] for everything. He comes and sees Dr.Crawford maybe once a year. He says Dr.Crawford is aware of this, he will bring updated labs when he comes back to see us . He declined scheduling at this time.

## 2023-10-19 ENCOUNTER — Ambulatory Visit (INDEPENDENT_AMBULATORY_CARE_PROVIDER_SITE_OTHER): Admitting: Podiatry

## 2023-10-19 ENCOUNTER — Encounter: Payer: Self-pay | Admitting: Podiatry

## 2023-10-19 DIAGNOSIS — B351 Tinea unguium: Secondary | ICD-10-CM | POA: Diagnosis not present

## 2023-10-19 DIAGNOSIS — M79676 Pain in unspecified toe(s): Secondary | ICD-10-CM

## 2023-10-19 DIAGNOSIS — E119 Type 2 diabetes mellitus without complications: Secondary | ICD-10-CM

## 2023-10-19 NOTE — Progress Notes (Signed)
This patient returns to my office for at risk foot care.  This patient requires this care by a professional since this patient will be at risk due to having type 1 diabetes.   This patient is unable to cut nails himself since the patient cannot reach his nails.These nails are painful walking and wearing shoes.  This patient presents for at risk foot care today.  General Appearance  Alert, conversant and in no acute stress.  Vascular  Dorsalis pedis and posterior tibial  pulses are palpable  bilaterally.  Capillary return is within normal limits  bilaterally. Temperature is within normal limits  bilaterally.  Neurologic  Senn-Weinstein monofilament wire test within normal limits  bilaterally. Muscle power within normal limits bilaterally.  Nails Thick disfigured discolored nails with subungual debris  from hallux to fifth toes bilaterally. No evidence of bacterial infection or drainage bilaterally.  Orthopedic  No limitations of motion  feet .  No crepitus or effusions noted.  No bony pathology or digital deformities noted.  Skin  normotropic skin with no porokeratosis noted bilaterally.  No signs of infections or ulcers noted.     Onychomycosis  Pain in right toes  Pain in left toes  Consent was obtained for treatment procedures.   Mechanical debridement of nails 1-5  bilaterally performed with a nail nipper.  Filed with dremel without incident.    Return office visit    12 weeks                  Told patient to return for periodic foot care and evaluation due to potential at risk complications.   Naomie Crow DPM  

## 2023-10-30 ENCOUNTER — Ambulatory Visit: Payer: Self-pay | Admitting: *Deleted

## 2023-10-30 NOTE — Telephone Encounter (Signed)
 Copied from CRM [id number]. Topic: Clinical - Red Word Triage >> Oct 30, 2023 10:57 AM Howard Macho wrote: Reason for CRM: patient called to make an appointment but he stated his blood pressure has been dropping low off and on for the last month and he almost passed out. Patient stated he has not been taking his medication because it has been so low.  BP readings: 96/40, 68/40, 70/40 Reason for Disposition  Brief (now gone) dizziness or lightheadedness after standing up or eating  Answer Assessment - Initial Assessment Questions 1. BLOOD PRESSURE: What is the blood pressure? Did you take at least two measurements 5 minutes apart?     Pt sounding very weak.   121/55 this morning.    It was 98/60 the other day.   100/52 last night.    I get my medication from the [facility].   It was changed in Feb.   I'm on Losartan  twice a day he cut it down to once a day.   Also on Coreg 12.5 mg twice a day.   This is a new medicine.  I started this Feb. 8 or 9, 2025.      I go to the [place].    2. ONSET: When did you take your blood pressure?     It's been at least a month or month and a half that my BP was low.   The losartan  was cut down to one a day from twice a day.    I'm still having too low BP.   The losartan  was reduced 2 weeks ago to once a day.    The doctor at the [facility] changed my BP medication.   I have not talked with my [facility].   It's hard to get through to my [facility] doctor.   I stopped taking the losartan  and my blood sugar medicine on June 3 because I felt like I was going to pass out.   I talked with the nurse at the [facility] and she told me to stop taking the BP medicine.   I was getting dizzy too with all that.     That's why I'm calling Dr. Nicolette Barrio.   I just want to see her and get all this straightened out.  I hardly got to the bed because I was so dizzy a month ago due to my BP being so low.   I felt like I was going to pass out.   I stayed in the bed a while and rested.   3. HOW: How did you obtain the blood  pressure? (e.g., visiting nurse, automatic home BP monitor)     Home BP kit.    4. HISTORY: Do you have a history of low blood pressure? What is your blood pressure normally?     Yes 5. MEDICINES: Are you taking any medications for blood pressure? If Yes, ask: Have they been changed recently?     See above 6. PULSE RATE: Do you know what your pulse rate is?      Not asked 7. OTHER SYMPTOMS: Have you been sick recently? Have you had a recent injury?     Dizziness and felt like I was going to pass out a month ago. I got a new doctor at the [place] and he isn't as good.   I just want to see Dr. Nicolette Barrio. 8. PREGNANCY: Is there any chance you are pregnant? When was your last menstrual period?     N/A  Protocols  used: Blood Pressure - Low-A-AH  Is it possible this pt can be worked in during the next day or two?   No appts with Dr. Nicolette Barrio until July.   See triage notes for BP and medication details.     FYI Only or Action Required?: Action required by provider  Patient was last seen in primary care on 03/06/2023 by Adelia Homestead, MD. Called Nurse Triage reporting Hypertension. Symptoms began about a month ago. Interventions attempted: Prescription medications: losartan  and Coreg. Symptoms are: unchanged.  Triage Disposition: See PCP When Office is Open (Within 3 Days)  Patient/caregiver understands and will follow disposition?: FYI Only or Action Required?: Yes    Pt requesting Dr. Nicolette Barrio maintain his medications instead of the [place].   Action required by provider  Patient was last seen in primary care on 03/06/2023 by Adelia Homestead, MD. Called Nurse Triage reporting Hypertension. Symptoms began about a month ago. Interventions attempted: Prescription medications: BP medication was adjusted by [facility] provider due to BP being low.  Still running lower than his normal.   Wants Dr. Nicolette Barrio to control BP medications instead of [facility] provider.. Symptoms are:  unchanged.  Triage Disposition: See PCP When Office is Open (Within 3 Days)  Patient/caregiver understands and will follow disposition?:  Yes.

## 2023-11-01 ENCOUNTER — Encounter (HOSPITAL_COMMUNITY): Payer: Self-pay

## 2023-11-01 ENCOUNTER — Emergency Department (HOSPITAL_COMMUNITY)
Admission: EM | Admit: 2023-11-01 | Discharge: 2023-11-01 | Disposition: A | Discharge status: Home / Self Care | Attending: Emergency Medicine | Admitting: Emergency Medicine

## 2023-11-01 DIAGNOSIS — I10 Essential (primary) hypertension: Secondary | ICD-10-CM

## 2023-11-01 DIAGNOSIS — I129 Hypertensive chronic kidney disease with stage 1 through stage 4 chronic kidney disease, or unspecified chronic kidney disease: Secondary | ICD-10-CM | POA: Diagnosis not present

## 2023-11-01 DIAGNOSIS — Z79899 Other long term (current) drug therapy: Secondary | ICD-10-CM | POA: Insufficient documentation

## 2023-11-01 DIAGNOSIS — R42 Dizziness and giddiness: Secondary | ICD-10-CM | POA: Diagnosis present

## 2023-11-01 DIAGNOSIS — N189 Chronic kidney disease, unspecified: Secondary | ICD-10-CM | POA: Diagnosis not present

## 2023-11-01 LAB — CBC
HCT: 31.5 % — ABNORMAL LOW (ref 39.0–52.0)
Hemoglobin: 8.9 g/dL — ABNORMAL LOW (ref 13.0–17.0)
MCH: 24.5 pg — ABNORMAL LOW (ref 26.0–34.0)
MCHC: 28.3 g/dL — ABNORMAL LOW (ref 30.0–36.0)
MCV: 86.5 fL (ref 80.0–100.0)
Platelets: 257 10*3/uL (ref 150–400)
RBC: 3.64 MIL/uL — ABNORMAL LOW (ref 4.22–5.81)
RDW: 17.8 % — ABNORMAL HIGH (ref 11.5–15.5)
WBC: 6.5 10*3/uL (ref 4.0–10.5)
nRBC: 0 % (ref 0.0–0.2)

## 2023-11-01 LAB — COMPREHENSIVE METABOLIC PANEL WITH GFR
ALT: 9 U/L (ref 0–44)
AST: 14 U/L — ABNORMAL LOW (ref 15–41)
Albumin: 3.5 g/dL (ref 3.5–5.0)
Alkaline Phosphatase: 77 U/L (ref 38–126)
Anion gap: 6 (ref 5–15)
BUN: 22 mg/dL (ref 8–23)
CO2: 19 mmol/L — ABNORMAL LOW (ref 22–32)
Calcium: 9.1 mg/dL (ref 8.9–10.3)
Chloride: 111 mmol/L (ref 98–111)
Creatinine, Ser: 1.93 mg/dL — ABNORMAL HIGH (ref 0.61–1.24)
GFR, Estimated: 33 mL/min — ABNORMAL LOW (ref >60)
Glucose, Bld: 99 mg/dL (ref 70–99)
Potassium: 4.1 mmol/L (ref 3.5–5.1)
Sodium: 136 mmol/L (ref 135–145)
Total Bilirubin: 0.7 mg/dL (ref 0.0–1.2)
Total Protein: 7.6 g/dL (ref 6.5–8.1)

## 2023-11-01 NOTE — ED Triage Notes (Signed)
 Pt c/o intermittent dizziness and hypotension xa while.  Pt reports he has seen the doctor at the [place] twice previously for same but still continues to feel bad.  Pt reports he had his medications most recently changed in June.

## 2023-11-01 NOTE — Discharge Instructions (Signed)
 [facility] to [facility]-up visit and for them to modify your blood pressure medication

## 2023-11-01 NOTE — ED Provider Notes (Addendum)
 [place] EMERGENCY DEPARTMENT AT [facility] Provider Note   CSN: [id number] Arrival date & time: 11/01/23  1524     Patient presents with: Hypotension and Dizziness   [name] [name] is a 89 y.o. male.   89 year old male presents due to experiencing labile blood pressures.  [facility]  He called their nurse hotline today.  I was able to review the old records at the [place] clinic and patient has had his blood pressure medications adjusted recently.  According to their notes today, the patient recorded blood pressures that were within normal limits x 3 for the last several days.  Patient states that he has had a longstanding history of weakness and dizziness.  He denies any syncope.  No cardiac symptomatology.  Would like further information about his blood pressure medication which is why he came here       Prior to Admission medications   Medication Sig Start Date End Date Taking? Authorizing Provider  acetaminophen  (TYLENOL ) 325 MG tablet Take 650 mg by mouth every 6 (six) hours as needed for fever or headache.    [provider]  amLODipine  (NORVASC ) 2.5 MG tablet Take 2.5 mg by mouth every morning.    [provider]  ascorbic acid (VITAMIN C) 250 MG tablet Take 250 mg by mouth every Monday, Wednesday, and Friday. 07/14/21   [provider]  aspirin  325 MG tablet Take 1 tablet (325 mg total) by mouth daily. 01/24/13   Christina Coyer, MD  atenolol  (TENORMIN ) 25 MG tablet Take 25 mg by mouth every morning.    [provider]  bismuth  subsalicylate (PEPTO BISMOL) 262 MG/15ML suspension Take 30 mLs by mouth every 6 (six) hours as needed for diarrhea or loose stools.    [provider]  cetirizine (ZYRTEC) 10 MG tablet Take 10 mg by mouth daily.    [provider]  Cholecalciferol (VITAMIN D -3) 25 MCG (1000 UT) CAPS Take 1,000 Units by mouth daily with breakfast.    [provider]   clotrimazole -betamethasone  (LOTRISONE ) cream Apply 1 Application topically daily. 01/11/23   Ruffin Cotton, DPM  desonide (DESOWEN) 0.05 % cream Apply 1 application. topically 2 (two) times daily as needed (for irritation/itching- skin folds).    [provider]  glucose blood (FREESTYLE LITE) test strip Use as instructed 01/03/22   Adelia Homestead, MD  guaiFENesin -dextromethorphan  (ROBITUSSIN DM) 100-10 MG/5ML syrup Take 5 mLs by mouth every 4 (four) hours as needed for cough (chest congestion). 08/16/21   Magdalene School, MD  INSULIN  GLARGINE [place] Inject 38 Units into the skin at bedtime.    [provider]  insulin  regular (NOVOLIN R) 100 units/mL injection Inject 5 Units into the skin 3 (three) times daily before meals. Sliding scale as needed    [provider]  ketotifen (ZADITOR) 0.025 % ophthalmic solution Place 1 drop into both eyes 2 (two) times daily as needed (irritation).    [provider]  losartan  (COZAAR ) 50 MG tablet Take 50 mg by mouth 2 (two) times daily.    [provider]  magnesium  hydroxide (MILK OF MAGNESIA) 400 MG/5ML suspension Take 30 mLs by mouth daily as needed for moderate constipation.    [provider]  metFORMIN (GLUCOPHAGE-XR) 500 MG 24 hr tablet Take 500 mg by mouth 2 (two) times daily.    [provider]  mupirocin  ointment (BACTROBAN ) 2 % Apply 1 application. topically 2 (two) times daily. 08/06/21   Martina Sledge,  Sarah E, NP  omeprazole (PRILOSEC) 20 MG capsule Take 20 mg by mouth 2 (two) times daily before a meal.    [provider]  polyvinyl alcohol (LIQUIFILM TEARS) 1.4 % ophthalmic solution Place 1 drop into both eyes 3 (three) times daily.     [provider]  simvastatin  (ZOCOR ) 40 MG tablet Take 20 mg by mouth every evening.    [provider]    Allergies: Lisinopril, Ferrous sulfate, and Glipizide    Review of Systems  All other systems reviewed and are  negative.   Updated Vital Signs BP (!) 174/67 (BP Location: Left Arm)   Pulse (!) 59   Temp 98.6 F (37 C) (Oral)   Resp 18   Ht 1.803 m (5' 11)   Wt 113.4 kg   SpO2 100%   BMI 34.87 kg/m   Physical Exam Vitals and nursing note reviewed.  Constitutional:      General: He is not in acute distress.    Appearance: Normal appearance. He is well-developed. He is not toxic-appearing.  HENT:     Head: Normocephalic and atraumatic.   Eyes:     General: Lids are normal.     Conjunctiva/sclera: Conjunctivae normal.     Pupils: Pupils are equal, round, and reactive to light.   Neck:     Thyroid : No thyroid  mass.     Trachea: No tracheal deviation.   Cardiovascular:     Rate and Rhythm: Normal rate and regular rhythm.     Heart sounds: Normal heart sounds. No murmur heard.    No gallop.  Pulmonary:     Effort: Pulmonary effort is normal. No respiratory distress.     Breath sounds: Normal breath sounds. No stridor. No decreased breath sounds, wheezing, rhonchi or rales.  Abdominal:     General: There is no distension.     Palpations: Abdomen is soft.     Tenderness: There is no abdominal tenderness. There is no rebound.   Musculoskeletal:        General: No tenderness. Normal range of motion.     Cervical back: Normal range of motion and neck supple.   Skin:    General: Skin is warm and dry.     Findings: No abrasion or rash.   Neurological:     Mental Status: He is alert and oriented to person, place, and time. Mental status is at baseline.     GCS: GCS eye subscore is 4. GCS verbal subscore is 5. GCS motor subscore is 6.     Cranial Nerves: No cranial nerve deficit.     Sensory: No sensory deficit.     Motor: Motor function is intact.   Psychiatric:        Attention and Perception: Attention normal.        Speech: Speech normal.        Behavior: Behavior normal.     (all labs ordered are listed, but only abnormal results are displayed) Labs Reviewed   COMPREHENSIVE METABOLIC PANEL WITH GFR - Abnormal; Notable for the following components:      Result Value   CO2 19 (*)    Creatinine, Ser 1.93 (*)    AST 14 (*)    GFR, Estimated 33 (*)    All other components within normal limits  CBC - Abnormal; Notable for the following components:   RBC 3.64 (*)    Hemoglobin 8.9 (*)    HCT 31.5 (*)    MCH 24.5 (*)  MCHC 28.3 (*)    RDW 17.8 (*)    All other components within normal limits  URINALYSIS, ROUTINE W REFLEX MICROSCOPIC    EKG: EKG Interpretation Date/Time:  Wednesday November 01 2023 17:06:22 EDT Ventricular Rate:  49 PR Interval:    QRS Duration:  96 QT Interval:  435 QTC Calculation: 393 R Axis:   72  Text Interpretation: Atrial fibrillation Borderline repolarization abnormality Confirmed by Lind Repine ([id number]) on 11/01/2023 8:01:01 PM  Radiology: No results found.   Procedures   Medications Ordered in the ED - No data to display                                  Medical Decision Making Amount and/or Complexity of Data Reviewed Labs: ordered.   Patient's EKG shows an indeterminate rhythm.  Patient has had no cardiac symptomatology here.  Creatinine today is 1.93.  I reviewed the patient's records from the [place] and he had a creatinine of 2.25 which was 15 days ago.  Patient's hemoglobin is at his baseline at around 8.9.  Patient has known history of chronic kidney disease and scheduled to see a nephrologist in the near future.  His blood pressure here has been stable.  I see no indication to adjust any of his blood pressure medication as he has pretty much stopped everything except for the losartan .  I informed the patient that he will need to call the [place] tomorrow to schedule a follow-up visit.  Patient does have a urostomy bag but has not had any urinary symptoms.  He has had no fever or chills.  Will not do a UA at this time.  Patient states his weakness that he has right now is something is been to lift quite some  time.  But maybe it was from his metformin being stopped.  His blood sugars stable here at 99.     Final diagnoses:  None    ED Discharge Orders     None          Lind Repine, MD 11/01/23 2045    Lind Repine, MD 11/01/23 2045

## 2023-11-08 NOTE — Progress Notes (Signed)
 11/06/23   CHIEF COMPLAINT Patient presents for  Chief Complaint  Patient presents with  . Diabetic Eye Exam      HISTORY OF PRESENT ILLNESS: [name] [name] is a 89 y.o. male who present to the clinic today for:  HPI     Diabetic Eye Exam   The patient is here for an initial visit.  The patient's treatment for diabetes includes oral medications and insulin .  The patient has been diabetic for 25 years.  The patient's last A1C was 10.  The patient reports vision fluctuates and blurred vision.        Comments   89 y.o. male is here for diabetic eye exam -referred by Particia Brave, MD -BS levels 194 1:58 (at appt) -pt states he has hx of cat left eye -cat sx right eye-(2-3 yrs ago) -no irritation with both eyes -c/o occas pain with both eyes -Tears/prn both eyes  Also followed by Dr. Alvia for retina/diabetic care - last visit 2023. Hx of diabetic retinopathy both eyes s/p laser        Last edited by Inocente FORBES Pao, OD on 11/06/2023  3:10 PM.      HISTORICAL INFORMATION:  CURRENT MEDICATIONS: Medications Ordered Prior to Encounter[1]  Referring physician: Particia Barnet Brave, MD [phone] [place],  [place] [address]  REVIEW OF SYSTEMS ROS   Positive for: Eyes Last edited by Marko LITTIE Husband, COT on 11/06/2023  1:52 PM.      ALLERGIES Allergies[2]  PAST MEDICAL HISTORY Medical History[3]  PAST SURGICAL HISTORY Surgical History[4]  FAMILY HISTORY Family History[5]  SOCIAL HISTORY Social History[6]  OPHTHALMIC EXAM Base Eye Exam     Visual Acuity (Snellen - Linear)       Right Left   Dist cc 20/60 20/30 -2   Dist ph cc 20/30 -1 NI    Correction: Glasses         Tonometry ([facility], 2:13 PM)       Right Left   Pressure 15 11         Pupils       Pupils   Right PERRL   Left PERRL         Visual Fields       Left Right    Full Full         Extraocular Movement       Right Left    Full  Full         Neuro/Psych     Oriented x3: Yes   Mood/Affect: Normal         Dilation     Both eyes: 2.5% Phenylephrine, 1.0% Tropicamide @ 2:15 PM           Slit Lamp and Fundus Exam     External Exam       Right Left   External Normal Normal         Slit Lamp Exam       Right Left   Lids/Lashes Dermatochalasis - upper lid Dermatochalasis - upper lid   Conjunctiva/Sclera White and quiet White and quiet   Cornea Clear Clear   Anterior Chamber Deep and quiet Deep and quiet   Iris Round and reactive Round and reactive   Lens Posterior chamber intraocular lens 2-3+ Nuclear sclerosis, 1+ Cortical cataract   Anterior Vitreous Normal Normal         Fundus Exam       Right Left   Disc Normal Normal  C/D Ratio 0.3 0.3   Macula Normal Normal   Vessels Arteriolar narrowing Arteriolar narrowing   Periphery s/p prp laser s/p prp laser           Refraction     Wearing Rx       Sphere Cylinder Axis Add   Right -1.25 +1.00 005 +2.75   Left -0.50 +1.00 023 +2.75    Age: 42yr   Type: Bifocal         Manifest Refraction (Subjective)       Sphere Cylinder Axis Dist [facility]   Right [place] +1.25 013 20/25   Left [place] +1.00 174 20/30-         Final Rx       Sphere Cylinder Axis Dist VA Add   Right [place] +1.25 013 20/25 +2.75   Left [place] +1.00 174 20/30- +2.75    Expiration Date: 11/05/2024             IMAGING AND PROCEDURES  Imaging and Procedures for 11/08/2023:    Prior Imaging and Procedures:    ASSESSMENT/PLAN:  1. Stable proliferative diabetic retinopathy of both eyes associated with type 2 diabetes mellitus    (CMD) (Primary) S/p prp both eyes Prior retina care with Dr. Alvia, prior records scanned in to media, reviewed Patient wants to establish retina care with [facility], will refer to Dr. Greven or Dr. Maree  2. Pseudophakia, right eye stable  3. Combined form of age-related cataract, left eye Moderate  Affecting  vision; may want to pursue cataract surgery. Will decide after retina visit and decide if wants Cataract [facility] with [facility] or [facility]  4. Regular astigmatism of both eyes with presbyopia Rx glasses dispensed  Ophthalmic Meds Ordered this visit: No orders of the defined types were placed in this encounter.     Return for establish retina care with Dr. Maree or Dr. Greven, prior care with Dr. Alvia .  There are no Patient Instructions on file for this visit.   Explained the diagnoses, plan, and follow up with the patient and they expressed understanding.  Patient expressed understanding of the importance of proper follow up care.    Abbreviations: M myopia (nearsighted); A astigmatism; H hyperopia (farsighted); P presbyopia; Mrx spectacle prescription;  CTL contact lenses; OD right eye; OS left eye; OU both eyes  XT exotropia; ET esotropia; PEK punctate epithelial keratitis; PEE punctate epithelial erosions; DES dry eye syndrome; MGD meibomian gland dysfunction; ATs artificial tears; PFAT's preservative free artificial tears; NSC nuclear sclerotic cataract; PSC posterior subcapsular cataract; ERM epi-retinal membrane; PVD posterior vitreous detachment; RD retinal detachment; DM diabetes mellitus; DR diabetic retinopathy; NPDR non-proliferative diabetic retinopathy; PDR proliferative diabetic retinopathy; CSME clinically significant macular edema; DME diabetic macular edema; dbh dot blot hemorrhages; CWS cotton wool spot; POAG primary open angle glaucoma; C/D cup-to-disc ratio; HVF humphrey visual field; GVF goldmann visual field; OCT optical coherence tomography; IOP intraocular pressure; BRVO Branch retinal vein occlusion; CRVO central retinal vein occlusion; CRAO central retinal artery occlusion; BRAO branch retinal artery occlusion; RT retinal tear; SB scleral buckle; PPV pars plana vitrectomy; VH Vitreous hemorrhage; PRP panretinal laser photocoagulation; IVK intravitreal kenalog ; VMT  vitreomacular traction; MH Macular hole;  NVD neovascularization of the disc; NVE neovascularization elsewhere; AREDS age related eye disease study; ARMD age related macular degeneration; POAG primary open angle glaucoma; EBMD epithelial/anterior basement membrane dystrophy; ACIOL anterior chamber intraocular lens; IOL intraocular lens; PCIOL posterior chamber intraocular lens; Phaco/IOL phacoemulsification with intraocular lens placement; PRK  photorefractive keratectomy; LASIK laser assisted in situ keratomileusis; HTN hypertension; DM diabetes mellitus; COPD chronic obstructive pulmonary disease      [1] Current Outpatient Medications on File Prior to Visit  Medication Sig Dispense Refill  . acetaminophen  (TYLENOL ) 325 mg tablet Take 650 mg by mouth.    . atenoloL  (TENORMIN ) 25 mg tablet Take 25 mg by mouth every morning.    . metFORMIN (GLUCOPHAGE-XR) 500 mg 24 hr tablet Take 500 mg by mouth.    . simvastatin  (ZOCOR ) 40 mg tablet Take 20 mg by mouth every evening.     No current facility-administered medications on file prior to visit.  [2] Allergies Allergen Reactions  . Lisinopril Other (See Comments) and Swelling    Facial swelling  . Ferrous Sulfate Other (See Comments)    Constipation  . Glipizide Rash  [3] History reviewed. No pertinent past medical history. [4] History reviewed. No pertinent surgical history. [5] Family History Problem Relation Name Age of Onset  . Hypertension Mother    . Diabetes Mother    . Hypertension Brother    . Diabetes Brother    . Hypertension Maternal Grandmother    . Diabetes Maternal Grandmother    [6]

## 2023-11-21 ENCOUNTER — Other Ambulatory Visit: Payer: Self-pay | Admitting: Nephrology

## 2023-11-21 DIAGNOSIS — N184 Chronic kidney disease, stage 4 (severe): Secondary | ICD-10-CM

## 2023-11-23 ENCOUNTER — Ambulatory Visit
Admission: RE | Admit: 2023-11-23 | Discharge: 2023-11-23 | Disposition: A | Discharge status: Home / Self Care | Source: Ambulatory Visit | Attending: Nephrology | Admitting: Nephrology

## 2023-11-23 DIAGNOSIS — N189 Chronic kidney disease, unspecified: Secondary | ICD-10-CM | POA: Diagnosis not present

## 2023-11-23 DIAGNOSIS — N184 Chronic kidney disease, stage 4 (severe): Secondary | ICD-10-CM

## 2024-01-18 ENCOUNTER — Ambulatory Visit: Admitting: Podiatry

## 2024-01-18 ENCOUNTER — Encounter: Payer: Self-pay | Admitting: Podiatry

## 2024-01-18 DIAGNOSIS — E119 Type 2 diabetes mellitus without complications: Secondary | ICD-10-CM

## 2024-01-18 DIAGNOSIS — B351 Tinea unguium: Secondary | ICD-10-CM

## 2024-01-18 DIAGNOSIS — M79676 Pain in unspecified toe(s): Secondary | ICD-10-CM

## 2024-01-18 NOTE — Progress Notes (Signed)
 This patient returns to my office for at risk foot care.  This patient requires this care by a professional since this patient will be at risk due to having type 1 diabetes.   This patient is unable to cut nails himself since the patient cannot reach his nails.These nails are painful walking and wearing shoes.  This patient presents for at risk foot care today.  General Appearance  Alert, conversant and in no acute stress.  Vascular  Dorsalis pedis and posterior tibial  pulses are  weakly palpable  bilaterally.  Capillary return is within normal limits  bilaterally. Temperature is within normal limits  bilaterally.  Neurologic  Senn-Weinstein monofilament wire test within normal limits  bilaterally. Muscle power within normal limits bilaterally.  Nails Thick disfigured discolored nails with subungual debris  from hallux to fifth toes bilaterally. No evidence of bacterial infection or drainage bilaterally.  Orthopedic  No limitations of motion  feet .  No crepitus or effusions noted.  No bony pathology or digital deformities noted  Skin  normotropic skin with no porokeratosis noted bilaterally.  No signs of infections or ulcers noted.     Onychomycosis  Pain in right toes  Pain in left toes  Consent was obtained for treatment procedures.   Mechanical debridement of nails 1-5  bilaterally performed with a nail nipper.  Filed with dremel without incident.     Return office visit    10 weeks                  Told patient to return for periodic foot care and evaluation due to potential at risk complications.   Cordella Bold DPM

## 2024-01-22 ENCOUNTER — Ambulatory Visit: Admitting: Podiatry

## 2024-01-23 ENCOUNTER — Other Ambulatory Visit: Payer: Self-pay

## 2024-01-23 ENCOUNTER — Inpatient Hospital Stay (HOSPITAL_COMMUNITY)
Admission: EM | Admit: 2024-01-23 | Discharge: 2024-01-24 | DRG: 948 | Disposition: A | Discharge status: Home / Self Care | Attending: Internal Medicine | Admitting: Internal Medicine

## 2024-01-23 ENCOUNTER — Emergency Department (HOSPITAL_COMMUNITY)

## 2024-01-23 ENCOUNTER — Encounter (HOSPITAL_COMMUNITY): Payer: Self-pay

## 2024-01-23 DIAGNOSIS — Z7984 Long term (current) use of oral hypoglycemic drugs: Secondary | ICD-10-CM

## 2024-01-23 DIAGNOSIS — E1122 Type 2 diabetes mellitus with diabetic chronic kidney disease: Secondary | ICD-10-CM | POA: Diagnosis present

## 2024-01-23 DIAGNOSIS — R1013 Epigastric pain: Secondary | ICD-10-CM | POA: Diagnosis present

## 2024-01-23 DIAGNOSIS — K7682 Hepatic encephalopathy: Secondary | ICD-10-CM | POA: Diagnosis not present

## 2024-01-23 DIAGNOSIS — Z7982 Long term (current) use of aspirin: Secondary | ICD-10-CM | POA: Diagnosis not present

## 2024-01-23 DIAGNOSIS — Z923 Personal history of irradiation: Secondary | ICD-10-CM

## 2024-01-23 DIAGNOSIS — Z860100 Personal history of colon polyps, unspecified: Secondary | ICD-10-CM | POA: Diagnosis not present

## 2024-01-23 DIAGNOSIS — E785 Hyperlipidemia, unspecified: Secondary | ICD-10-CM | POA: Diagnosis present

## 2024-01-23 DIAGNOSIS — Z833 Family history of diabetes mellitus: Secondary | ICD-10-CM

## 2024-01-23 DIAGNOSIS — E66811 Obesity, class 1: Secondary | ICD-10-CM | POA: Diagnosis present

## 2024-01-23 DIAGNOSIS — N1832 Chronic kidney disease, stage 3b: Secondary | ICD-10-CM | POA: Diagnosis present

## 2024-01-23 DIAGNOSIS — R079 Chest pain, unspecified: Secondary | ICD-10-CM

## 2024-01-23 DIAGNOSIS — K219 Gastro-esophageal reflux disease without esophagitis: Secondary | ICD-10-CM | POA: Diagnosis present

## 2024-01-23 DIAGNOSIS — Z79899 Other long term (current) drug therapy: Secondary | ICD-10-CM

## 2024-01-23 DIAGNOSIS — I700 Atherosclerosis of aorta: Secondary | ICD-10-CM | POA: Diagnosis present

## 2024-01-23 DIAGNOSIS — Z932 Ileostomy status: Secondary | ICD-10-CM

## 2024-01-23 DIAGNOSIS — Z6833 Body mass index (BMI) 33.0-33.9, adult: Secondary | ICD-10-CM | POA: Diagnosis not present

## 2024-01-23 DIAGNOSIS — R7401 Elevation of levels of liver transaminase levels: Principal | ICD-10-CM | POA: Diagnosis present

## 2024-01-23 DIAGNOSIS — T391X5A Adverse effect of 4-Aminophenol derivatives, initial encounter: Secondary | ICD-10-CM | POA: Diagnosis present

## 2024-01-23 DIAGNOSIS — Z87891 Personal history of nicotine dependence: Secondary | ICD-10-CM | POA: Diagnosis not present

## 2024-01-23 DIAGNOSIS — M159 Polyosteoarthritis, unspecified: Secondary | ICD-10-CM | POA: Diagnosis present

## 2024-01-23 DIAGNOSIS — Z66 Do not resuscitate: Secondary | ICD-10-CM | POA: Diagnosis present

## 2024-01-23 DIAGNOSIS — R7989 Other specified abnormal findings of blood chemistry: Principal | ICD-10-CM | POA: Diagnosis present

## 2024-01-23 DIAGNOSIS — Z9221 Personal history of antineoplastic chemotherapy: Secondary | ICD-10-CM

## 2024-01-23 DIAGNOSIS — I129 Hypertensive chronic kidney disease with stage 1 through stage 4 chronic kidney disease, or unspecified chronic kidney disease: Secondary | ICD-10-CM | POA: Diagnosis present

## 2024-01-23 DIAGNOSIS — Z794 Long term (current) use of insulin: Secondary | ICD-10-CM

## 2024-01-23 DIAGNOSIS — Z888 Allergy status to other drugs, medicaments and biological substances status: Secondary | ICD-10-CM

## 2024-01-23 DIAGNOSIS — E1165 Type 2 diabetes mellitus with hyperglycemia: Secondary | ICD-10-CM | POA: Diagnosis present

## 2024-01-23 DIAGNOSIS — Z8551 Personal history of malignant neoplasm of bladder: Secondary | ICD-10-CM

## 2024-01-23 DIAGNOSIS — R0789 Other chest pain: Secondary | ICD-10-CM | POA: Diagnosis present

## 2024-01-23 LAB — CBC
HCT: 31.5 % — ABNORMAL LOW (ref 39.0–52.0)
Hemoglobin: 8.6 g/dL — ABNORMAL LOW (ref 13.0–17.0)
MCH: 23.4 pg — ABNORMAL LOW (ref 26.0–34.0)
MCHC: 27.3 g/dL — ABNORMAL LOW (ref 30.0–36.0)
MCV: 85.8 fL (ref 80.0–100.0)
Platelets: 250 K/uL (ref 150–400)
RBC: 3.67 MIL/uL — ABNORMAL LOW (ref 4.22–5.81)
RDW: 16.8 % — ABNORMAL HIGH (ref 11.5–15.5)
WBC: 4.3 K/uL (ref 4.0–10.5)
nRBC: 0 % (ref 0.0–0.2)

## 2024-01-23 LAB — PROTIME-INR
INR: 1 (ref 0.8–1.2)
Prothrombin Time: 14 s (ref 11.4–15.2)

## 2024-01-23 LAB — BASIC METABOLIC PANEL WITH GFR
Anion gap: 15 (ref 5–15)
BUN: 23 mg/dL (ref 8–23)
CO2: 15 mmol/L — ABNORMAL LOW (ref 22–32)
Calcium: 9.4 mg/dL (ref 8.9–10.3)
Chloride: 104 mmol/L (ref 98–111)
Creatinine, Ser: 1.95 mg/dL — ABNORMAL HIGH (ref 0.61–1.24)
GFR, Estimated: 32 mL/min — ABNORMAL LOW (ref >60)
Glucose, Bld: 237 mg/dL — ABNORMAL HIGH (ref 70–99)
Potassium: 4 mmol/L (ref 3.5–5.1)
Sodium: 135 mmol/L (ref 135–145)

## 2024-01-23 LAB — ACETAMINOPHEN LEVEL: Acetaminophen (Tylenol), Serum: <10 ug/mL — ABNORMAL LOW (ref 10–30)

## 2024-01-23 LAB — APTT: aPTT: 32 s (ref 24–36)

## 2024-01-23 LAB — HEPATIC FUNCTION PANEL
ALT: 353 U/L — ABNORMAL HIGH (ref 0–44)
AST: 890 U/L — ABNORMAL HIGH (ref 15–41)
Albumin: 3.9 g/dL (ref 3.5–5.0)
Alkaline Phosphatase: 174 U/L — ABNORMAL HIGH (ref 38–126)
Bilirubin, Direct: 0.5 mg/dL — ABNORMAL HIGH (ref 0.0–0.2)
Indirect Bilirubin: 0.5 mg/dL (ref 0.3–0.9)
Total Bilirubin: 0.9 mg/dL (ref 0.0–1.2)
Total Protein: 7.6 g/dL (ref 6.5–8.1)

## 2024-01-23 LAB — GLUCOSE, CAPILLARY: Glucose-Capillary: 149 mg/dL — ABNORMAL HIGH (ref 70–99)

## 2024-01-23 LAB — LIPASE, BLOOD: Lipase: 70 U/L — ABNORMAL HIGH (ref 11–51)

## 2024-01-23 LAB — PRO BRAIN NATRIURETIC PEPTIDE: Pro Brain Natriuretic Peptide: 248 pg/mL (ref <300.0)

## 2024-01-23 LAB — CBG MONITORING, ED: Glucose-Capillary: 168 mg/dL — ABNORMAL HIGH (ref 70–99)

## 2024-01-23 LAB — TROPONIN T, HIGH SENSITIVITY
Troponin T High Sensitivity: 31 ng/L — ABNORMAL HIGH (ref 0–19)
Troponin T High Sensitivity: 31 ng/L — ABNORMAL HIGH (ref 0–19)

## 2024-01-23 MED ORDER — PANTOPRAZOLE SODIUM 40 MG PO TBEC
40.0000 mg | DELAYED_RELEASE_TABLET | Freq: Every day | ORAL | Status: DC
  Administered 2024-01-24: 40 mg via ORAL
  Filled 2024-01-23: qty 1

## 2024-01-23 MED ORDER — SODIUM CHLORIDE 0.9 % IV BOLUS
500.0000 mL | Freq: Once | INTRAVENOUS | Status: AC
  Administered 2024-01-23: 500 mL via INTRAVENOUS

## 2024-01-23 MED ORDER — IOHEXOL 300 MG/ML  SOLN
80.0000 mL | Freq: Once | INTRAMUSCULAR | Status: AC | PRN
  Administered 2024-01-23: 80 mL via INTRAVENOUS

## 2024-01-23 MED ORDER — IBUPROFEN 200 MG PO TABS: 400.0000 mg | ORAL_TABLET | Freq: Four times a day (QID) | ORAL | Status: DC | PRN

## 2024-01-23 MED ORDER — INSULIN ASPART 100 UNIT/ML IJ SOLN
0.0000 [IU] | Freq: Three times a day (TID) | INTRAMUSCULAR | Status: DC
  Administered 2024-01-24: 8 [IU] via SUBCUTANEOUS
  Administered 2024-01-24: 2 [IU] via SUBCUTANEOUS

## 2024-01-23 MED ORDER — ALBUTEROL SULFATE (2.5 MG/3ML) 0.083% IN NEBU: 2.5000 mg | INHALATION_SOLUTION | RESPIRATORY_TRACT | Status: DC | PRN

## 2024-01-23 MED ORDER — ENOXAPARIN SODIUM 60 MG/0.6ML IJ SOSY
50.0000 mg | PREFILLED_SYRINGE | INTRAMUSCULAR | Status: DC
  Administered 2024-01-23: 50 mg via SUBCUTANEOUS
  Filled 2024-01-23: qty 0.6

## 2024-01-23 MED ORDER — ONDANSETRON HCL 4 MG PO TABS: 4.0000 mg | ORAL_TABLET | Freq: Four times a day (QID) | ORAL | Status: DC | PRN

## 2024-01-23 MED ORDER — TRAZODONE HCL 50 MG PO TABS: 25.0000 mg | ORAL_TABLET | Freq: Every evening | ORAL | Status: DC | PRN

## 2024-01-23 MED ORDER — INSULIN ASPART 100 UNIT/ML IJ SOLN: 0.0000 [IU] | Freq: Every day | INTRAMUSCULAR | Status: DC

## 2024-01-23 MED ORDER — ONDANSETRON HCL 4 MG/2ML IJ SOLN: 4.0000 mg | Freq: Four times a day (QID) | INTRAMUSCULAR | Status: DC | PRN

## 2024-01-23 MED ORDER — GADOBUTROL 1 MMOL/ML IV SOLN
10.0000 mL | Freq: Once | INTRAVENOUS | Status: AC | PRN
Start: 2024-01-23 — End: 2024-01-23
  Administered 2024-01-23: 10 mL via INTRAVENOUS

## 2024-01-23 MED ORDER — SIMVASTATIN 20 MG PO TABS
20.0000 mg | ORAL_TABLET | Freq: Every day | ORAL | Status: DC
Start: 2024-01-23 — End: 2024-01-24
  Administered 2024-01-23: 20 mg via ORAL
  Filled 2024-01-23: qty 1

## 2024-01-23 MED ORDER — INSULIN GLARGINE 100 UNIT/ML ~~LOC~~ SOLN
44.0000 [IU] | Freq: Every day | SUBCUTANEOUS | Status: DC
  Filled 2024-01-23: qty 0.44

## 2024-01-23 NOTE — ED Provider Triage Note (Signed)
 Emergency Medicine Provider Triage Evaluation Note  [name] [name] , a 89 y.o. male  was evaluated in triage.  Pt complains of shortness of breath and chest fullness.  Started this morning.  Denies chest pain or palpitations.  Denies cough or fever.  Review of Systems  Positive: See above Negative: See above  Physical Exam  BP (!) 158/78 (BP Location: Right Arm)   Pulse 73   Temp 98.3 F (36.8 C) (Oral)   Resp (!) 22   Ht 5' 11 (1.803 m)   Wt 108.9 kg   SpO2 100%   BMI 33.47 kg/m  Gen:   Awake, no distress   Resp:  Normal effort  MSK:   Moves extremities without difficulty  Other:    Medical Decision Making  Medically screening exam initiated at 11:16 AM.  Appropriate orders placed.  [name] [name] was informed that the remainder of the evaluation will be completed by another provider, this initial triage assessment does not replace that evaluation, and the importance of remaining in the ED until their evaluation is complete.  Work up started   Lang Norleen POUR, PA-C 01/23/24 1117

## 2024-01-23 NOTE — ED Triage Notes (Signed)
 Pt reports with chest tightening, pressure, and shob since 0200 this morning.

## 2024-01-23 NOTE — ED Provider Notes (Signed)
 Case discussed with Dr Roxane regarding admission.   Randol Simmonds, MD 01/23/24 [phone]

## 2024-01-23 NOTE — ED Provider Notes (Signed)
 [place] EMERGENCY DEPARTMENT AT [facility] Provider Note   CSN: [id number] Arrival date & time: 01/23/24  1007     Patient presents with: Chest Pain   [name] [name] is a 89 y.o. male.    Chest Pain   Patient presents with  chest discomfort.  Patient states that woke up last night with some chest discomfort.  He describes the feeling as more of a fullness.  It was feels like he is having like GERD or acid reflux.  Patient also endorsing some right upper quadrant pain at the same time.  Patient's not sure whether or not he has ever had any kind of abdominal surgery other than the urostomy bag but he still thinks he has his gallbladder and other right upper quad organs.  No nausea or vomiting.  No fever or chills.  Bowel moods been regular.  Last bowel movement was yesterday.  No sick contacts he is aware of.  No exertional chest pain.  No pleuritic chest pain.  No hemoptysis.  Denies any history of ACS.  No history of DVT or PE.     Previous medical history reviewed : Patient last seen in the ED on November 01, 2023.  Was seen because of hypotension and dizziness.  Unremarkable workup at that time.   Prior to Admission medications   Medication Sig Start Date End Date Taking? Authorizing Provider  acetaminophen  (TYLENOL ) 325 MG tablet Take 650 mg by mouth every 6 (six) hours as needed for fever or headache.    [provider]  amLODipine  (NORVASC ) 2.5 MG tablet Take 2.5 mg by mouth every morning.    [provider]  ascorbic acid (VITAMIN C) 250 MG tablet Take 250 mg by mouth every Monday, Wednesday, and Friday. 07/14/21   [provider]  aspirin  325 MG tablet Take 1 tablet (325 mg total) by mouth daily. 01/24/13   Nieves Cough, MD  atenolol  (TENORMIN ) 25 MG tablet Take 25 mg by mouth every morning.    [provider]  bismuth  subsalicylate (PEPTO BISMOL) 262 MG/15ML suspension Take 30 mLs by mouth every 6 (six) hours as needed for  diarrhea or loose stools.    [provider]  cetirizine (ZYRTEC) 10 MG tablet Take 10 mg by mouth daily.    [provider]  Cholecalciferol (VITAMIN D -3) 25 MCG (1000 UT) CAPS Take 1,000 Units by mouth daily with breakfast.    [provider]  clotrimazole -betamethasone  (LOTRISONE ) cream Apply 1 Application topically daily. 01/11/23   Loreda Hacker, DPM  desonide (DESOWEN) 0.05 % cream Apply 1 application. topically 2 (two) times daily as needed (for irritation/itching- skin folds).    [provider]  glucose blood (FREESTYLE LITE) test strip Use as instructed 01/03/22   Rollene Almarie LABOR, MD  guaiFENesin -dextromethorphan  (ROBITUSSIN DM) 100-10 MG/5ML syrup Take 5 mLs by mouth every 4 (four) hours as needed for cough (chest congestion). 08/16/21   Leotis Bogus, MD  INSULIN  GLARGINE [place] Inject 38 Units into the skin at bedtime.    [provider]  insulin  regular (NOVOLIN R) 100 units/mL injection Inject 5 Units into the skin 3 (three) times daily before meals. Sliding scale as needed    [provider]  ketotifen (ZADITOR) 0.025 % ophthalmic solution Place 1 drop into both eyes 2 (two) times daily as needed (irritation).    [provider]  losartan  (COZAAR ) 50 MG tablet Take 50 mg by mouth 2 (two) times daily.    [provider]  magnesium  hydroxide (MILK OF MAGNESIA) 400 MG/5ML suspension Take 30 mLs by mouth daily as needed for moderate constipation.    [provider]  metFORMIN (GLUCOPHAGE-XR) 500 MG 24 hr tablet Take 500 mg by mouth 2 (two) times daily.    [provider]  mupirocin  ointment (BACTROBAN ) 2 % Apply 1 application. topically 2 (two) times daily. 08/06/21   Gray, Sarah E, NP  omeprazole (PRILOSEC) 20 MG capsule Take 20 mg by mouth 2 (two) times daily before a meal.    [provider]  polyvinyl alcohol (LIQUIFILM TEARS) 1.4 % ophthalmic solution Place 1 drop into both eyes 3  (three) times daily.     [provider]  simvastatin  (ZOCOR ) 40 MG tablet Take 20 mg by mouth every evening.    [provider]    Allergies: Lisinopril, Ferrous sulfate, and Glipizide    Review of Systems  Cardiovascular:  Positive for chest pain.    Updated Vital Signs BP (!) 155/83 (BP Location: Right Arm)   Pulse 60   Temp 98 F (36.7 C) (Oral)   Resp 17   Ht 5' 11 (1.803 m)   Wt 108.9 kg   SpO2 100%   BMI 33.47 kg/m   Physical Exam Vitals and nursing note reviewed.  Constitutional:      General: He is not in acute distress.    Appearance: He is well-developed.  HENT:     Head: Normocephalic and atraumatic.  Eyes:     Conjunctiva/sclera: Conjunctivae normal.  Cardiovascular:     Rate and Rhythm: Normal rate and regular rhythm.     Heart sounds: No murmur heard. Pulmonary:     Effort: Pulmonary effort is normal. No respiratory distress.     Breath sounds: Normal breath sounds.  Abdominal:     Palpations: Abdomen is soft.     Tenderness: There is no abdominal tenderness.  Musculoskeletal:        General: No swelling.     Cervical back: Neck supple.  Skin:    General: Skin is warm and dry.     Capillary Refill: Capillary refill takes less than 2 seconds.  Neurological:     Mental Status: He is alert.  Psychiatric:        Mood and Affect: Mood normal.     (all labs ordered are listed, but only abnormal results are displayed) Labs Reviewed  BASIC METABOLIC PANEL WITH GFR - Abnormal; Notable for the following components:      Result Value   CO2 15 (*)    Glucose, Bld 237 (*)    Creatinine, Ser 1.95 (*)    GFR, Estimated 32 (*)    All other components within normal limits  CBC - Abnormal; Notable for the following components:   RBC 3.67 (*)    Hemoglobin 8.6 (*)    HCT 31.5 (*)    MCH 23.4 (*)    MCHC 27.3 (*)    RDW 16.8 (*)    All other components within normal limits  HEPATIC FUNCTION PANEL - Abnormal; Notable for the following  components:   AST 890 (*)    ALT 353 (*)    Alkaline Phosphatase 174 (*)    Bilirubin, Direct 0.5 (*)    All other components within normal limits  LIPASE, BLOOD - Abnormal; Notable for the following components:   Lipase 70 (*)    All other components within normal limits  CBG MONITORING, ED - Abnormal; Notable for  the following components:   Glucose-Capillary 168 (*)    All other components within normal limits  TROPONIN T, HIGH SENSITIVITY - Abnormal; Notable for the following components:   Troponin T High Sensitivity 31 (*)    All other components within normal limits  TROPONIN T, HIGH SENSITIVITY - Abnormal; Notable for the following components:   Troponin T High Sensitivity 31 (*)    All other components within normal limits  PRO BRAIN NATRIURETIC PEPTIDE  PROTIME-INR  APTT    EKG: EKG Interpretation Date/Time:  Tuesday January 23 2024 10:16:06 EDT Ventricular Rate:  78 PR Interval:  281 QRS Duration:  84 QT Interval:  368 QTC Calculation: 420 R Axis:   72  Text Interpretation: Sinus rhythm Prolonged PR interval Nonspecific repol abnormality, diffuse leads Confirmed by Simon Rea [phone]) on 01/23/2024 10:28:22 AM  Radiology: US  Abdomen Limited RUQ (LIVER/GB) Result Date: 01/23/2024 CLINICAL DATA:  Pain. EXAM: ULTRASOUND ABDOMEN LIMITED RIGHT UPPER QUADRANT COMPARISON:  None Available. FINDINGS: Gallbladder: No gallstones are visualized. The gallbladder wall measures 3.8 mm in thickness. No sonographic Murphy sign noted by sonographer. Common bile duct: Diameter: N/A  (The common bile duct is not visualized.) Liver: No focal lesion identified. Within normal limits in parenchymal echogenicity. Portal vein is patent on color Doppler imaging with normal direction of blood flow towards the liver. Other: The study is technically limited secondary to the patient's body habitus, as per the ultrasound technologist. IMPRESSION: 1. Limited study with subsequently limited visualization  of the common bile duct. 2. No evidence of cholelithiasis or acute cholecystitis. Electronically Signed   By: Suzen Dials M.D.   On: 01/23/2024 16:52   CT ABDOMEN PELVIS W CONTRAST Result Date: 01/23/2024 CLINICAL DATA:  Right upper quadrant pain, elevated LFTs, chest pressure EXAM: CT ABDOMEN AND PELVIS WITH CONTRAST TECHNIQUE: Multidetector CT imaging of the abdomen and pelvis was performed using the standard protocol following bolus administration of intravenous contrast. RADIATION DOSE REDUCTION: This exam was performed according to the departmental dose-optimization program which includes automated exposure control, adjustment of the mA and/or kV according to patient size and/or use of iterative reconstruction technique. CONTRAST:  80mL OMNIPAQUE  IOHEXOL  300 MG/ML  SOLN COMPARISON:  08/12/2021 FINDINGS: Lower chest: No acute pleural or parenchymal lung disease. Hepatobiliary: No focal liver abnormality is seen. No gallstones, gallbladder wall thickening, or biliary dilatation. Pancreas: Unremarkable. No pancreatic ductal dilatation or surrounding inflammatory changes. Spleen: Normal in size without focal abnormality. Adrenals/Urinary Tract: Postsurgical changes from cystectomy and right lower quadrant urinary diversion. Simple appearing cortical and peripelvic renal cysts are identified, which do not require specific imaging follow-up. No urinary tract calculi or obstructive uropathy. Stable left adrenal nodularity consistent with benign adenoma, no follow-up recommended. Right adrenal is unremarkable. Stomach/Bowel: No bowel obstruction or ileus. Distal colonic diverticulosis without diverticulitis. Nondilated ileal conduit in the right lower quadrant, in this patient with prior cystectomy. Vascular/Lymphatic: Aortic atherosclerosis. No enlarged abdominal or pelvic lymph nodes. Reproductive: Prior prostatectomy. No abnormalities within the prostate bed. Other: No free fluid or free intraperitoneal gas.  No abdominal wall hernia. Musculoskeletal: No acute or destructive bony abnormalities. Reconstructed images demonstrate no additional findings. IMPRESSION: 1. No acute intra-abdominal or intrapelvic process. 2. Distal colonic diverticulosis without diverticulitis. 3. Cystoprostatectomy, with unremarkable urinary diversion right lower quadrant. 4.  Aortic Atherosclerosis (ICD10-I70.0). Electronically Signed   By: Ozell Daring M.D.   On: 01/23/2024 15:50   DG Chest Port 1 View Result Date: 01/23/2024 CLINICAL DATA:  Shortness of breath EXAM:  PORTABLE CHEST 1 VIEW COMPARISON:  Chest radiograph August 13, 2021 FINDINGS: The heart size is borderline normal. Mediastinal contours are within normal limits. Both lungs are clear. Aortic knob is prominent. Thoracic aorta is tortuous. Degenerative changes of the spine. No pleural effusion or pneumothorax. IMPRESSION: No active disease. Electronically Signed   By: Megan  Zare M.D.   On: 01/23/2024 11:16     Procedures   Medications Ordered in the ED  sodium chloride  0.9 % bolus 500 mL (0 mLs Intravenous Stopped 01/23/24 1500)  iohexol  (OMNIPAQUE ) 300 MG/ML solution 80 mL (80 mLs Intravenous Contrast Given 01/23/24 1521)                                    Medical Decision Making Amount and/or Complexity of Data Reviewed Labs: ordered. Radiology: ordered.  Risk Prescription drug management.    Patient presents with  chest discomfort.  Patient states that woke up last night with some chest discomfort.  He describes the feeling as more of a fullness.  It was feels like he is having like GERD or acid reflux.  Patient also endorsing some right upper quadrant pain at the same time.  Patient's not sure whether or not he has ever had any kind of abdominal surgery other than the urostomy bag but he still thinks he has his gallbladder and other right upper quad organs.  No nausea or vomiting.  No fever or chills.  Bowel moods been regular.  Last bowel movement was  yesterday.  No sick contacts he is aware of.  No exertional chest pain.  No pleuritic chest pain.  No hemoptysis.  Denies any history of ACS.  No history of DVT or PE.     Previous medical history reviewed : Patient last seen in the ED on November 01, 2023.  Was seen because of hypotension and dizziness.  Unremarkable workup at that time.   Patient does have pain to palpation epigastric and right upper quadrant area.  Laboratory workup showed elevated LFTs.  Elevated lipase as well.  INR normal.  Patient denies any Tylenol  use.  No alcohol use.  No recent changes in medications.  No recent GI illness such as diarrhea other concern for hepatitis A.  Did obtain CT scan.  No biliary dilation.  No evidence of cholecystitis.  Also obtain right upper quad ultrasound.  No cholelithiasis.  Repeat exam, patient does have pain to palpation in this area.  I did speak to gastroenterology on-call Dr. Rollin. Recommended MRCP and they will follow inpatient.   In terms of chest pain.  EKG sinus.  Remaining cardiac telemetry.  Sinus rhythm.  Troponin x 2 were flat without making of acute changes.  Minimally elevated likely in the setting of CKD.  No longer somatic.  No concerns for ACS process at this time.  Patient also has no risk factors for DVT or PE.  No further workup needed for this at this time.       Final diagnoses:  LFT elevation  Epigastric pain  Chest pain, unspecified type    ED Discharge Orders     None          Simon Lavonia SAILOR, MD 01/23/24 [phone]

## 2024-01-23 NOTE — H&P (Addendum)
 History and Physical  [name] [name] DOB: January 25, 1934 DOA: 01/23/2024  PCP: Rollene Almarie LABOR, MD   Chief Complaint: Lower chest/upper abdominal pressure  HPI: [name] [name] is a 89 y.o. male with medical history significant for hypertension, hyperlipidemia, GERD, insulin -dependent type 2 diabetes admitted to the hospital with hepatitis.  Patient states he was in his usual state of health, until about 2 AM when he was woken from sleep with a sensation of heaviness in the middle lower part of his chest.  Initially he tells me that he has never felt this way before, but later states that he has felt this way when he has reflux.  He denies any frank chest pain, or any abdominal pain.  States that he takes Tylenol  occasionally for joint pains, probably 2 or 3 times a week.  He has not drank any alcohol in months because it gives him heartburn.  He denies any prior history of liver disease, he does have a history of urothelial cancer and has an ileostomy in place.  Denies any fevers, chills, nausea, diarrhea or other concerns.  Review of Systems: Please see HPI for pertinent positives and negatives. A complete 10 system review of systems are otherwise negative.  Past Medical History:  Diagnosis Date   Adenoma of left adrenal gland    STABLE PER UROLOGIST NOTE DR ESKRIDGE   Anemia    Anxiety    Bilateral dry eyes    Blood transfusion without reported diagnosis    Cataract    Generalized OA    GERD (gastroesophageal reflux disease)    History of bladder cancer    S/P RADICAL CYSTECTOMY W/ IDEAL LOOP URINARY DIVERSION  2000   Hyperlipidemia    Hypertension    S/P ileostomy (HCC)    URINARY ILEAL   Type 2 diabetes mellitus (HCC)    Urothelial cancer (HCC)    Urothelial carcinoma (HCC)    penile urethra   Past Surgical History:  Procedure Laterality Date   CATARACT EXTRACTION Right    CYSTOSCOPY WITH BIOPSY N/A 01/22/2013   Procedure: CYSTOSCOPY WITH BIOPSY;   Surgeon: Donnice Gwenyth Brooks, MD;  Location: [facility];  Service: Urology;  Laterality: N/A;  Urethra Biopsy   CYSTOSCOPY WITH BIOPSY N/A 04/16/2013   Procedure: CYSTOSCOPY WITH BIOPSY OF URETHRA/FULGURATION;  Surgeon: Donnice Gwenyth Brooks, MD;  Location: [facility];  Service: Urology;  Laterality: N/A;   RADICAL CYSTECTOMY AND ILEAL LOOP DIVERSION  2000   BLADDER CANCER   Social History:  reports that he quit smoking about 48 years ago. His smoking use included cigarettes. He started smoking about 68 years ago. He has a 30 pack-year smoking history. He has never used smokeless tobacco. He reports current alcohol use. He reports that he does not use drugs.  Allergies  Allergen Reactions   Lisinopril Swelling and Other (See Comments)    Facial swelling   Ferrous Sulfate Other (See Comments)    Constipation   Glipizide Rash    Family History  Problem Relation Age of Onset   Diabetes Mother    Colon cancer Neg Hx    Esophageal cancer Neg Hx    Rectal cancer Neg Hx    Stomach cancer Neg Hx      Prior to Admission medications   Medication Sig Start Date End Date Taking? Authorizing Provider  acetaminophen  (TYLENOL ) 325 MG tablet Take 650 mg by mouth every 6 (six) hours as needed for fever or headache.  [provider]  amLODipine  (NORVASC ) 2.5 MG tablet Take 2.5 mg by mouth every morning.    [provider]  ascorbic acid (VITAMIN C) 250 MG tablet Take 250 mg by mouth every Monday, Wednesday, and Friday. 07/14/21   [provider]  aspirin  325 MG tablet Take 1 tablet (325 mg total) by mouth daily. 01/24/13   Nieves Cough, MD  atenolol  (TENORMIN ) 25 MG tablet Take 25 mg by mouth every morning.    [provider]  bismuth  subsalicylate (PEPTO BISMOL) 262 MG/15ML suspension Take 30 mLs by mouth every 6 (six) hours as needed for diarrhea or loose stools.    [provider]  cetirizine (ZYRTEC) 10 MG  tablet Take 10 mg by mouth daily.    [provider]  Cholecalciferol (VITAMIN D -3) 25 MCG (1000 UT) CAPS Take 1,000 Units by mouth daily with breakfast.    [provider]  clotrimazole -betamethasone  (LOTRISONE ) cream Apply 1 Application topically daily. 01/11/23   Loreda Hacker, DPM  desonide (DESOWEN) 0.05 % cream Apply 1 application. topically 2 (two) times daily as needed (for irritation/itching- skin folds).    [provider]  glucose blood (FREESTYLE LITE) test strip Use as instructed 01/03/22   Rollene Almarie LABOR, MD  guaiFENesin -dextromethorphan  (ROBITUSSIN DM) 100-10 MG/5ML syrup Take 5 mLs by mouth every 4 (four) hours as needed for cough (chest congestion). 08/16/21   Leotis Bogus, MD  INSULIN  GLARGINE [place] Inject 38 Units into the skin at bedtime.    [provider]  insulin  regular (NOVOLIN R) 100 units/mL injection Inject 5 Units into the skin 3 (three) times daily before meals. Sliding scale as needed    [provider]  ketotifen (ZADITOR) 0.025 % ophthalmic solution Place 1 drop into both eyes 2 (two) times daily as needed (irritation).    [provider]  losartan  (COZAAR ) 50 MG tablet Take 50 mg by mouth 2 (two) times daily.    [provider]  magnesium  hydroxide (MILK OF MAGNESIA) 400 MG/5ML suspension Take 30 mLs by mouth daily as needed for moderate constipation.    [provider]  metFORMIN (GLUCOPHAGE-XR) 500 MG 24 hr tablet Take 500 mg by mouth 2 (two) times daily.    [provider]  mupirocin  ointment (BACTROBAN ) 2 % Apply 1 application. topically 2 (two) times daily. 08/06/21   Gray, Sarah E, NP  omeprazole (PRILOSEC) 20 MG capsule Take 20 mg by mouth 2 (two) times daily before a meal.    [provider]  polyvinyl alcohol (LIQUIFILM TEARS) 1.4 % ophthalmic solution Place 1 drop into both eyes 3 (three) times daily.     [provider]  simvastatin  (ZOCOR ) 40 MG tablet  Take 20 mg by mouth every evening.    [provider]    Physical Exam: BP (!) 174/87   Pulse 60   Temp 98 F (36.7 C) (Oral)   Resp 12   Ht 5' 11 (1.803 m)   Wt 108.9 kg   SpO2 100%   BMI 33.47 kg/m  General:  Alert, oriented, calm, in no acute distress, resting comfortably, appears younger than his stated age Cardiovascular: RRR, no murmurs or rubs, no peripheral edema  Respiratory: clear to auscultation bilaterally, no wheezes, no crackles  Abdomen: soft, nontender, slightly distended, normal bowel tones heard, he has a right lower quadrant ileostomy Skin: dry, no rashes  Musculoskeletal: no joint effusions, normal range of motion  Psychiatric: appropriate affect, normal speech  Neurologic: extraocular muscles  intact, clear speech, moving all extremities with intact sensorium         Labs on Admission:  Basic Metabolic Panel: Recent Labs  Lab 01/23/24 1010  NA 135  K 4.0  CL 104  CO2 15*  GLUCOSE 237*  BUN 23  CREATININE 1.95*  CALCIUM 9.4   Liver Function Tests: Recent Labs  Lab 01/23/24 1014  AST 890*  ALT 353*  ALKPHOS 174*  BILITOT 0.9  PROT 7.6  ALBUMIN 3.9   Recent Labs  Lab 01/23/24 1313  LIPASE 70*   No results for input(s): AMMONIA in the last 168 hours. CBC: Recent Labs  Lab 01/23/24 1010  WBC 4.3  HGB 8.6*  HCT 31.5*  MCV 85.8  PLT 250   Cardiac Enzymes: No results for input(s): CKTOTAL, CKMB, CKMBINDEX, TROPONINI in the last 168 hours. BNP (last 3 results) No results for input(s): BNP in the last 8760 hours.  ProBNP (last 3 results) Recent Labs    01/23/24 1014  PROBNP 248.0    CBG: Recent Labs  Lab 01/23/24 1659  GLUCAP 168*    Radiological Exams on Admission: US  Abdomen Limited RUQ (LIVER/GB) Result Date: 01/23/2024 CLINICAL DATA:  Pain. EXAM: ULTRASOUND ABDOMEN LIMITED RIGHT UPPER QUADRANT COMPARISON:  None Available. FINDINGS: Gallbladder: No gallstones are visualized. The gallbladder wall  measures 3.8 mm in thickness. No sonographic Murphy sign noted by sonographer. Common bile duct: Diameter: N/A  (The common bile duct is not visualized.) Liver: No focal lesion identified. Within normal limits in parenchymal echogenicity. Portal vein is patent on color Doppler imaging with normal direction of blood flow towards the liver. Other: The study is technically limited secondary to the patient's body habitus, as per the ultrasound technologist. IMPRESSION: 1. Limited study with subsequently limited visualization of the common bile duct. 2. No evidence of cholelithiasis or acute cholecystitis. Electronically Signed   By: Suzen Dials M.D.   On: 01/23/2024 16:52   CT ABDOMEN PELVIS W CONTRAST Result Date: 01/23/2024 CLINICAL DATA:  Right upper quadrant pain, elevated LFTs, chest pressure EXAM: CT ABDOMEN AND PELVIS WITH CONTRAST TECHNIQUE: Multidetector CT imaging of the abdomen and pelvis was performed using the standard protocol following bolus administration of intravenous contrast. RADIATION DOSE REDUCTION: This exam was performed according to the departmental dose-optimization program which includes automated exposure control, adjustment of the mA and/or kV according to patient size and/or use of iterative reconstruction technique. CONTRAST:  80mL OMNIPAQUE  IOHEXOL  300 MG/ML  SOLN COMPARISON:  08/12/2021 FINDINGS: Lower chest: No acute pleural or parenchymal lung disease. Hepatobiliary: No focal liver abnormality is seen. No gallstones, gallbladder wall thickening, or biliary dilatation. Pancreas: Unremarkable. No pancreatic ductal dilatation or surrounding inflammatory changes. Spleen: Normal in size without focal abnormality. Adrenals/Urinary Tract: Postsurgical changes from cystectomy and right lower quadrant urinary diversion. Simple appearing cortical and peripelvic renal cysts are identified, which do not require specific imaging follow-up. No urinary tract calculi or obstructive uropathy.  Stable left adrenal nodularity consistent with benign adenoma, no follow-up recommended. Right adrenal is unremarkable. Stomach/Bowel: No bowel obstruction or ileus. Distal colonic diverticulosis without diverticulitis. Nondilated ileal conduit in the right lower quadrant, in this patient with prior cystectomy. Vascular/Lymphatic: Aortic atherosclerosis. No enlarged abdominal or pelvic lymph nodes. Reproductive: Prior prostatectomy. No abnormalities within the prostate bed. Other: No free fluid or free intraperitoneal gas. No abdominal wall hernia. Musculoskeletal: No acute or destructive bony abnormalities. Reconstructed images demonstrate no additional findings. IMPRESSION: 1. No acute intra-abdominal or intrapelvic process. 2. Distal colonic  diverticulosis without diverticulitis. 3. Cystoprostatectomy, with unremarkable urinary diversion right lower quadrant. 4.  Aortic Atherosclerosis (ICD10-I70.0). Electronically Signed   By: Ozell Daring M.D.   On: 01/23/2024 15:50   DG Chest Port 1 View Result Date: 01/23/2024 CLINICAL DATA:  Shortness of breath EXAM: PORTABLE CHEST 1 VIEW COMPARISON:  Chest radiograph August 13, 2021 FINDINGS: The heart size is borderline normal. Mediastinal contours are within normal limits. Both lungs are clear. Aortic knob is prominent. Thoracic aorta is tortuous. Degenerative changes of the spine. No pleural effusion or pneumothorax. IMPRESSION: No active disease. Electronically Signed   By: Megan  Zare M.D.   On: 01/23/2024 11:16   Assessment/Plan [name] [name] is a 89 y.o. male with medical history significant for hypertension, hyperlipidemia, GERD, insulin -dependent type 2 diabetes admitted to the hospital with hepatitis.   Hepatitis-with elevated AST/ALT 890/353, minimal direct bilirubin elevation at 0.5.  Alk phos elevated 174.  Lipase 70.  CT abdomen pelvis and ultrasound of the liver without acute findings, though common bile duct not well-visualized. -Inpatient  admission -Avoid hepatotoxins -ER provider discussed with GI Dr. Rollin who recommends MRCP and will consult -Check acetaminophen  level and acute hepatitis panel -MRCP ordered  Chest pressure-unclear etiology, troponin is slightly elevated in the setting of CKD but flat.  Possibly due to the patient's known acid reflux.  Insulin -dependent type 2 diabetes-last A1c 8.9 one-year ago -Update A1c -Carb modified diet -Lantus  44 units nightly -Moderate dose sliding scale  CKD stage IIIb-renal function appears to be at baseline  GERD-omeprazole  Hyperlipidemia-Zocor   Essential hypertension-continue home amlodipine  and losartan  once medications are confirmed  DVT prophylaxis: Lovenox      Code Status: Limited: Do not attempt resuscitation (DNR) -DNR-LIMITED -Do Not Intubate/DNI   Consults called: GI Dr. Rollin  Admission status: The appropriate patient status for this patient is INPATIENT. Inpatient status is judged to be reasonable and necessary in order to provide the required intensity of service to ensure the patient's safety. The patient's presenting symptoms, physical exam findings, and initial radiographic and laboratory data in the context of their chronic comorbidities is felt to place them at high risk for further clinical deterioration. Furthermore, it is not anticipated that the patient will be medically stable for discharge from the hospital within 2 midnights of admission.    I certify that at the point of admission it is my clinical judgment that the patient will require inpatient hospital care spanning beyond 2 midnights from the point of admission due to high intensity of service, high risk for further deterioration and high frequency of surveillance required  Time spent: 53 minutes  [name] [name] CHRISTELLA Gail MD [facility] Pager [phone]  If 7PM-7AM, please contact night-coverage [email or web address] Password [email or web address]  01/23/2024, 6:10 PM

## 2024-01-23 NOTE — ED Notes (Signed)
Pt tx to MRI

## 2024-01-24 DIAGNOSIS — R7989 Other specified abnormal findings of blood chemistry: Secondary | ICD-10-CM

## 2024-01-24 LAB — HEMOGLOBIN A1C
Hgb A1c MFr Bld: 9.9 % — ABNORMAL HIGH (ref 4.8–5.6)
Mean Plasma Glucose: 237.43 mg/dL

## 2024-01-24 LAB — CBC
HCT: 30.3 % — ABNORMAL LOW (ref 39.0–52.0)
Hemoglobin: 8.7 g/dL — ABNORMAL LOW (ref 13.0–17.0)
MCH: 24.3 pg — ABNORMAL LOW (ref 26.0–34.0)
MCHC: 28.7 g/dL — ABNORMAL LOW (ref 30.0–36.0)
MCV: 84.6 fL (ref 80.0–100.0)
Platelets: 226 K/uL (ref 150–400)
RBC: 3.58 MIL/uL — ABNORMAL LOW (ref 4.22–5.81)
RDW: 16.9 % — ABNORMAL HIGH (ref 11.5–15.5)
WBC: 4.8 K/uL (ref 4.0–10.5)
nRBC: 0 % (ref 0.0–0.2)

## 2024-01-24 LAB — COMPREHENSIVE METABOLIC PANEL WITH GFR
ALT: 259 U/L — ABNORMAL HIGH (ref 0–44)
AST: 254 U/L — ABNORMAL HIGH (ref 15–41)
Albumin: 3.7 g/dL (ref 3.5–5.0)
Alkaline Phosphatase: 159 U/L — ABNORMAL HIGH (ref 38–126)
Anion gap: 10 (ref 5–15)
BUN: 19 mg/dL (ref 8–23)
CO2: 21 mmol/L — ABNORMAL LOW (ref 22–32)
Calcium: 9.3 mg/dL (ref 8.9–10.3)
Chloride: 105 mmol/L (ref 98–111)
Creatinine, Ser: 1.68 mg/dL — ABNORMAL HIGH (ref 0.61–1.24)
GFR, Estimated: 39 mL/min — ABNORMAL LOW (ref >60)
Glucose, Bld: 162 mg/dL — ABNORMAL HIGH (ref 70–99)
Potassium: 3.8 mmol/L (ref 3.5–5.1)
Sodium: 137 mmol/L (ref 135–145)
Total Bilirubin: 0.8 mg/dL (ref 0.0–1.2)
Total Protein: 7.2 g/dL (ref 6.5–8.1)

## 2024-01-24 LAB — HEPATITIS PANEL, ACUTE
HCV Ab: NONREACTIVE
Hep A IgM: NONREACTIVE
Hep B C IgM: NONREACTIVE
Hepatitis B Surface Ag: NONREACTIVE

## 2024-01-24 LAB — GLUCOSE, CAPILLARY
Glucose-Capillary: 135 mg/dL — ABNORMAL HIGH (ref 70–99)
Glucose-Capillary: 269 mg/dL — ABNORMAL HIGH (ref 70–99)

## 2024-01-24 MED ORDER — LORATADINE 10 MG PO TABS
10.0000 mg | ORAL_TABLET | Freq: Every day | ORAL | Status: DC
  Administered 2024-01-24: 10 mg via ORAL
  Filled 2024-01-24: qty 1

## 2024-01-24 NOTE — Consult Note (Addendum)
 Consultation  Referring Provider: Dr. Uz[place]    Primary Care Physician:  Rollene Almarie LABOR, MD Primary Gastroenterologist: Dr. Leigh        Reason for Consultation: Elevated LFTs            HPI:   Daysean Tinkham is a 89 y.o. male with a past medical history as listed below including hypertension, history of urothelial cancer with ileostomy in place, hyperlipidemia, GERD, diabetes type 2 and multiple others, admitted to the hospital with hepatitis on 01/23/2024.    At time of admission patient described being in his usual state of health until about 2 AM on 01/23/2024 when he woke up with a sensation of heaviness in the middle lower part of his chest.  This is similar to prior episodes of reflux.  Denies NSAIDs.  Describe not drinking alcohol for months because it gave him heartburn.    Today, the patient tells me that he was actually feeling okay other than waking up around 2 or 3:00 in the morning yesterday with a chest pressure/discomfort that radiated to his upper abdomen.  Explains that he had felt similarly in the past sometimes when he would have reflux so he took some Mylanta but it did not help.  He does have some chronic reflux symptoms and is on Omeprazole which sounds like it does a pretty good job.  Explains that other than this pain which he woke up with, which disappeared as of last night, he was feeling completely normal.      Describes he was having some issues with constipation ever since his doctor stopped his Metformin and has been using milk of magnesia 1 or 2 times a week but with it is able to have normal bowel movements.  Tells me he has only had one 2 ounce drinks since Christmas this year, tells me before that he was not a big alcohol drinker, he just sipped it very occasionally.      His largest health issue has been his urothelial cancer which apparently started in 1997, he was treated with radiation and chemo and eventually removal of his bladder with 2  recurrences the last in 2015.      He was also recently taken off of his blood pressure meds because his blood pressure had been remaining somewhat low.  Otherwise denies any supplements or over-the-counter products.  No history of IV drug use or tattoos.  No family history of liver disease.  No preceding fever or viral prodrome.    Denies fever, chills, weight loss, change in bowel habits, abdominal pain, nausea or vomiting.  ER course: 01/23/2024 AST 890, ALT 353, total bili 0.9, alk phos 174--> 01/22/2024 AST 254, ALT 259, total bili 0.8, alk phos 159; lipase minimally elevated at 70, hemoglobin 8.7, platelets normal at 226, BUN normal, creatinine 1.68; 01/23/2024 MRI/MRCP with left adrenal lipid rich adenoma which did not require imaging follow-up, bilateral T1 and T2 hyperintense renal cysts without abnormal enhancement, no significant hepatic steatosis or focal liver lesion or biliary dilation, diverticulosis; right upper quadrant ultrasound was limited, no evidence of cholelithiasis or acute cholecystitis portal vein patent; CTAP with no acute intra-abdominal or intrapelvic process, distal colonic diverticulosis without diverticulitis, cystoprostatectomy with unremarkable urinary diversion right lower quadrant, aortic atherosclerosis  GI history: 02/15/2018 EGD with normal esophagus, few gastric polyps, single duodenal polyp, otherwise normal stomach and duodenum 12/22/2017 colonoscopy done for personal history of adenomatous polyps on last colonoscopy more than 5 years ago with  one 5 mm polyp in the cecum, two 4-10 mm polyps in the ascending colon, two 4-5 mm polyps in transverse colon, two 3-8 mm polyps in the sigmoid colon and diverticulosis in the left colon as well as internal hemorrhoids; pathology showed tubular adenomas  Past Medical History:  Diagnosis Date   Adenoma of left adrenal gland    STABLE PER UROLOGIST NOTE DR ESKRIDGE   Anemia    Anxiety    Bilateral dry eyes    Blood transfusion  without reported diagnosis    Cataract    Generalized OA    GERD (gastroesophageal reflux disease)    History of bladder cancer    S/P RADICAL CYSTECTOMY W/ IDEAL LOOP URINARY DIVERSION  2000   Hyperlipidemia    Hypertension    S/P ileostomy (HCC)    URINARY ILEAL   Type 2 diabetes mellitus (HCC)    Urothelial cancer (HCC)    Urothelial carcinoma (HCC)    penile urethra    Past Surgical History:  Procedure Laterality Date   CATARACT EXTRACTION Right    CYSTOSCOPY WITH BIOPSY N/A 01/22/2013   Procedure: CYSTOSCOPY WITH BIOPSY;  Surgeon: Donnice Gwenyth Brooks, MD;  Location: [facility];  Service: Urology;  Laterality: N/A;  Urethra Biopsy   CYSTOSCOPY WITH BIOPSY N/A 04/16/2013   Procedure: CYSTOSCOPY WITH BIOPSY OF URETHRA/FULGURATION;  Surgeon: Donnice Gwenyth Brooks, MD;  Location: [facility];  Service: Urology;  Laterality: N/A;   RADICAL CYSTECTOMY AND ILEAL LOOP DIVERSION  2000   BLADDER CANCER    Family History  Problem Relation Age of Onset   Diabetes Mother    Colon cancer Neg Hx    Esophageal cancer Neg Hx    Rectal cancer Neg Hx    Stomach cancer Neg Hx     Social History   Tobacco Use   Smoking status: Former    Current packs/day: 0.00    Average packs/day: 1.5 packs/day for 20.0 years (30.0 ttl pk-yrs)    Types: Cigarettes    Start date: 01/19/1956    Quit date: 01/19/1976    Years since quitting: 48.0   Smokeless tobacco: Never  Vaping Use   Vaping status: Never Used  Substance Use Topics   Alcohol use: Yes    Comment: OCCASIONAL   Drug use: No    Prior to Admission medications   Medication Sig Start Date End Date Taking? Authorizing Provider  acetaminophen  (TYLENOL ) 325 MG tablet Take 650 mg by mouth 2 (two) times daily as needed (for pain).   Yes [provider]  aspirin  325 MG tablet Take 1 tablet (325 mg total) by mouth daily. 01/24/13  Yes Brooks Donnice, MD  bismuth  subsalicylate (PEPTO BISMOL) 262  MG/15ML suspension Take 30 mLs by mouth every 6 (six) hours as needed for diarrhea or loose stools.   Yes [provider]  Carboxymethylcellulose Sod PF 0.5 % SOLN Place 1 drop into both eyes in the morning, at noon, and at bedtime.   Yes [provider]  cetirizine (ZYRTEC) 10 MG tablet Take 10 mg by mouth daily.   Yes [provider]  Cholecalciferol (VITAMIN D -3) 25 MCG (1000 UT) CAPS Take 1,000 Units by mouth daily with breakfast.   Yes [provider]  empagliflozin (JARDIANCE) 25 MG TABS tablet Take 12.5 mg by mouth in the morning.   Yes [provider]  insulin  aspart (NOVOLOG ) 100 UNIT/ML FlexPen Inject 10-12 Units into the skin See admin instructions. Inject 12  units into the skin before breakfast & supper and 10 units at 11 PM   Yes [provider]  insulin  glargine (LANTUS ) 100 UNIT/ML Solostar Pen Inject 44 Units into the skin in the morning.   Yes [provider]  ketotifen (ZADITOR) 0.025 % ophthalmic solution Place 1 drop into both eyes 2 (two) times daily as needed (for inflammation).   Yes [provider]  magnesium  hydroxide (MILK OF MAGNESIA) 400 MG/5ML suspension Take 30 mLs by mouth daily as needed for moderate constipation.   Yes [provider]  omeprazole (PRILOSEC) 20 MG capsule Take 20 mg by mouth 2 (two) times daily before a meal.   Yes [provider]  simvastatin  (ZOCOR ) 20 MG tablet Take 20 mg by mouth at bedtime.   Yes [provider]  clotrimazole -betamethasone  (LOTRISONE ) cream Apply 1 Application topically daily. Patient not taking: Reported on 01/23/2024 01/11/23   Loreda Hacker, DPM  glucose blood (FREESTYLE LITE) test strip Use as instructed 01/03/22   Rollene Almarie LABOR, MD  guaiFENesin -dextromethorphan  (ROBITUSSIN DM) 100-10 MG/5ML syrup Take 5 mLs by mouth every 4 (four) hours as needed for cough (chest congestion). Patient not taking: Reported on 01/23/2024 08/16/21    Leotis Bogus, MD  mupirocin  ointment (BACTROBAN ) 2 % Apply 1 application. topically 2 (two) times daily. Patient not taking: Reported on 01/23/2024 08/06/21   Elnor Lauraine BRAVO, NP    Current Facility-Administered Medications  Medication Dose Route Frequency Provider Last Rate Last Admin   albuterol  (PROVENTIL ) (2.5 MG/3ML) 0.083% nebulizer solution 2.5 mg  2.5 mg Nebulization Q2H PRN Zella, Mir M, MD       enoxaparin  (LOVENOX ) injection 50 mg  50 mg Subcutaneous Q24H Zella, Mir M, MD   50 mg at 01/23/24 2206   ibuprofen  (ADVIL ) tablet 400 mg  400 mg Oral Q6H PRN Zella Katha HERO, MD       insulin  aspart (novoLOG ) injection 0-15 Units  0-15 Units Subcutaneous TID WC Zella, Mir M, MD       insulin  aspart (novoLOG ) injection 0-5 Units  0-5 Units Subcutaneous QHS Zella, Mir M, MD       insulin  glargine (LANTUS ) injection 44 Units  44 Units Subcutaneous QHS Zella, Mir M, MD       ondansetron  (ZOFRAN ) tablet 4 mg  4 mg Oral Q6H PRN Zella, Mir M, MD       Or   ondansetron  (ZOFRAN ) injection 4 mg  4 mg Intravenous Q6H PRN Zella, Mir M, MD       pantoprazole  (PROTONIX ) EC tablet 40 mg  40 mg Oral Daily Zella, Mir M, MD       traZODone  (DESYREL ) tablet 25 mg  25 mg Oral QHS PRN Zella Katha HERO, MD        Allergies as of 01/23/2024 - Review Complete 01/23/2024  Allergen Reaction Noted   Iron Dermatitis 06/23/2023   Lisinopril Swelling and Other (See Comments)    Ace inhibitors Dermatitis 06/23/2023   Ferrous sulfate Other (See Comments) 06/22/2021   Glipizide Rash and Dermatitis 03/01/2010     Review of Systems:    Constitutional: No weight loss, fever or chills Skin: No rash  Cardiovascular: See HPI   Respiratory: No SOB Gastrointestinal: See HPI and otherwise negative Genitourinary: No dysuria Neurological: No headache, dizziness or syncope Musculoskeletal: No new muscle or joint pain Hematologic: No bleeding  Psychiatric: No history of  depression or anxiety    Physical Exam:  Vital signs in last 24 hours:  Temp:  [98 F (36.7 C)-98.3 F (36.8 C)] 98.1 F (36.7 C) (09/10 0753) Pulse Rate:  [53-73] 60 (09/10 0753) Resp:  [12-23] 16 (09/10 0753) BP: (138-191)/(78-137) 152/81 (09/10 0753) SpO2:  [89 %-100 %] 100 % (09/09 1944) Weight:  [108.9 kg] 108.9 kg (09/09 1016)   General:   Pleasant obese AA male appears to be in NAD, Well developed, Well nourished, alert and cooperative Head:  Normocephalic and atraumatic. Eyes:   PEERL, EOMI. No icterus. Conjunctiva pink. Ears:  Normal auditory acuity. Neck:  Supple Throat: Oral cavity and pharynx without inflammation, swelling or lesion. Teeth in good condition. Lungs: Respirations even and unlabored. Lungs clear to auscultation bilaterally.   No wheezes, crackles, or rhonchi.  Heart: Normal S1, S2. No MRG. Regular rate and rhythm. No peripheral edema, cyanosis or pallor.  Abdomen:  Soft, nondistended, nontender. No rebound or guarding. Normal bowel sounds. No appreciable masses or hepatomegaly. Rectal:  Not performed.  Msk:  Symmetrical without gross deformities. Peripheral pulses intact.  Extremities:  Without edema, no deformity or joint abnormality.  Neurologic:  Alert and  oriented x4;  grossly normal neurologically.  Skin:   Dry and intact without significant lesions or rashes. Psychiatric: Demonstrates good judgement and reason without abnormal affect or behaviors.   LAB RESULTS: Recent Labs    01/23/24 1010 01/24/24 0431  WBC 4.3 4.8  HGB 8.6* 8.7*  HCT 31.5* 30.3*  PLT 250 226   BMET Recent Labs    01/23/24 1010 01/24/24 0431  NA 135 137  K 4.0 3.8  CL 104 105  CO2 15* 21*  GLUCOSE 237* 162*  BUN 23 19  CREATININE 1.95* 1.68*  CALCIUM 9.4 9.3      Latest Ref Rng & Units 01/24/2024    4:31 AM 01/23/2024   10:14 AM 11/01/2023    5:10 PM  Hepatic Function  Total Protein 6.5 - 8.1 g/dL 7.2  7.6  7.6   Albumin 3.5 - 5.0 g/dL 3.7  3.9  3.5   AST 15  - 41 U/L 254  890  14   ALT 0 - 44 U/L 259  353  9   Alk Phosphatase 38 - 126 U/L 159  174  77   Total Bilirubin 0.0 - 1.2 mg/dL 0.8  0.9  0.7   Bilirubin, Direct 0.0 - 0.2 mg/dL  0.5       PT/INR Recent Labs    01/23/24 1313  LABPROT 14.0  INR 1.0    STUDIES: MR ABDOMEN MRCP W WO CONTAST Result Date: 01/23/2024 CLINICAL DATA:  Right upper quadrant pain and elevated liver function tests EXAM: MRI ABDOMEN WITHOUT AND WITH CONTRAST (INCLUDING MRCP) TECHNIQUE: Multiplanar multisequence MR imaging of the abdomen was performed both before and after the administration of intravenous contrast. Heavily T2-weighted images of the biliary and pancreatic ducts were obtained, and three-dimensional MRCP images were rendered by post processing. CONTRAST:  10mL GADAVIST  GADOBUTROL  1 MMOL/ML IV SOLN COMPARISON:  Ultrasound abdomen January 23, 2024 CT abdomen pelvis January 23, 2024. FINDINGS: Lower chest: Unremarkable. Hepatobiliary/MRCP: Liver measures 16.9 cm in craniocaudal dimension. No significant hepatic steatosis. No focal liver lesion. Gallbladder is normal. Intra and extrahepatic bile ducts appear normal. CBD measures 2.3 cm. Partial visualization of the diverted ureters on MRCP views with normal diameter. Pancreas: No pancreatic abnormality or ductal dilatation. No mass lesion or peripancreatic fluid collection. Spleen:  Spleen is normal in size. Adrenals/Urinary Tract: Left adrenal nodules consistent with lipid rich adenoma, signal drop  on out of phase sequence measuring 1.8 cm, stable to prior. Right adrenal is normal. Multiple T1 hyperintense renal cysts consistent with cysts containing proteinaceous material/blood products. Additional few simple appearing T2 hyperintense cysts in both kidneys which does not require imaging follow-up. No hydronephrosis.Exam does not include pelvic cavity. Please refer to same day CT pelvis. Stomach/Bowel: Stomach is unremarkable. Diverticulosis without diverticulitis.  Small and large bowel loops appear nondilated and unremarkable. Vascular/Lymphatic: No lymphadenopathy. Vascular structures are normal. Musculoskeletal: No suspicious osseous lesion. IMPRESSION: Left adrenal lipid rich adenoma which does not require imaging follow-up. Bilateral T1 and T2 hyperintense renal cysts without abnormal enhancement (Bosniak 2). These lesions does not require imaging follow-up. No significant hepatic steatosis or focal liver lesion. No biliary dilatation. Diverticulosis. Please refer to same day CT pelvis for further details of postsurgical changes of cystectomy and urinary diversion. Electronically Signed   By: Megan  Zare M.D.   On: 01/23/2024 19:49   MR 3D Recon At Scanner Result Date: 01/23/2024 CLINICAL DATA:  Right upper quadrant pain and elevated liver function tests EXAM: MRI ABDOMEN WITHOUT AND WITH CONTRAST (INCLUDING MRCP) TECHNIQUE: Multiplanar multisequence MR imaging of the abdomen was performed both before and after the administration of intravenous contrast. Heavily T2-weighted images of the biliary and pancreatic ducts were obtained, and three-dimensional MRCP images were rendered by post processing. CONTRAST:  10mL GADAVIST  GADOBUTROL  1 MMOL/ML IV SOLN COMPARISON:  Ultrasound abdomen January 23, 2024 CT abdomen pelvis January 23, 2024. FINDINGS: Lower chest: Unremarkable. Hepatobiliary/MRCP: Liver measures 16.9 cm in craniocaudal dimension. No significant hepatic steatosis. No focal liver lesion. Gallbladder is normal. Intra and extrahepatic bile ducts appear normal. CBD measures 2.3 cm. Partial visualization of the diverted ureters on MRCP views with normal diameter. Pancreas: No pancreatic abnormality or ductal dilatation. No mass lesion or peripancreatic fluid collection. Spleen:  Spleen is normal in size. Adrenals/Urinary Tract: Left adrenal nodules consistent with lipid rich adenoma, signal drop on out of phase sequence measuring 1.8 cm, stable to prior. Right  adrenal is normal. Multiple T1 hyperintense renal cysts consistent with cysts containing proteinaceous material/blood products. Additional few simple appearing T2 hyperintense cysts in both kidneys which does not require imaging follow-up. No hydronephrosis.Exam does not include pelvic cavity. Please refer to same day CT pelvis. Stomach/Bowel: Stomach is unremarkable. Diverticulosis without diverticulitis. Small and large bowel loops appear nondilated and unremarkable. Vascular/Lymphatic: No lymphadenopathy. Vascular structures are normal. Musculoskeletal: No suspicious osseous lesion. IMPRESSION: Left adrenal lipid rich adenoma which does not require imaging follow-up. Bilateral T1 and T2 hyperintense renal cysts without abnormal enhancement (Bosniak 2). These lesions does not require imaging follow-up. No significant hepatic steatosis or focal liver lesion. No biliary dilatation. Diverticulosis. Please refer to same day CT pelvis for further details of postsurgical changes of cystectomy and urinary diversion. Electronically Signed   By: Megan  Zare M.D.   On: 01/23/2024 19:49   US  Abdomen Limited RUQ (LIVER/GB) Result Date: 01/23/2024 CLINICAL DATA:  Pain. EXAM: ULTRASOUND ABDOMEN LIMITED RIGHT UPPER QUADRANT COMPARISON:  None Available. FINDINGS: Gallbladder: No gallstones are visualized. The gallbladder wall measures 3.8 mm in thickness. No sonographic Murphy sign noted by sonographer. Common bile duct: Diameter: N/A  (The common bile duct is not visualized.) Liver: No focal lesion identified. Within normal limits in parenchymal echogenicity. Portal vein is patent on color Doppler imaging with normal direction of blood flow towards the liver. Other: The study is technically limited secondary to the patient's body habitus, as per the ultrasound technologist. IMPRESSION: 1. Limited  study with subsequently limited visualization of the common bile duct. 2. No evidence of cholelithiasis or acute cholecystitis.  Electronically Signed   By: Suzen Dials M.D.   On: 01/23/2024 16:52   CT ABDOMEN PELVIS W CONTRAST Result Date: 01/23/2024 CLINICAL DATA:  Right upper quadrant pain, elevated LFTs, chest pressure EXAM: CT ABDOMEN AND PELVIS WITH CONTRAST TECHNIQUE: Multidetector CT imaging of the abdomen and pelvis was performed using the standard protocol following bolus administration of intravenous contrast. RADIATION DOSE REDUCTION: This exam was performed according to the departmental dose-optimization program which includes automated exposure control, adjustment of the mA and/or kV according to patient size and/or use of iterative reconstruction technique. CONTRAST:  80mL OMNIPAQUE  IOHEXOL  300 MG/ML  SOLN COMPARISON:  08/12/2021 FINDINGS: Lower chest: No acute pleural or parenchymal lung disease. Hepatobiliary: No focal liver abnormality is seen. No gallstones, gallbladder wall thickening, or biliary dilatation. Pancreas: Unremarkable. No pancreatic ductal dilatation or surrounding inflammatory changes. Spleen: Normal in size without focal abnormality. Adrenals/Urinary Tract: Postsurgical changes from cystectomy and right lower quadrant urinary diversion. Simple appearing cortical and peripelvic renal cysts are identified, which do not require specific imaging follow-up. No urinary tract calculi or obstructive uropathy. Stable left adrenal nodularity consistent with benign adenoma, no follow-up recommended. Right adrenal is unremarkable. Stomach/Bowel: No bowel obstruction or ileus. Distal colonic diverticulosis without diverticulitis. Nondilated ileal conduit in the right lower quadrant, in this patient with prior cystectomy. Vascular/Lymphatic: Aortic atherosclerosis. No enlarged abdominal or pelvic lymph nodes. Reproductive: Prior prostatectomy. No abnormalities within the prostate bed. Other: No free fluid or free intraperitoneal gas. No abdominal wall hernia. Musculoskeletal: No acute or destructive bony  abnormalities. Reconstructed images demonstrate no additional findings. IMPRESSION: 1. No acute intra-abdominal or intrapelvic process. 2. Distal colonic diverticulosis without diverticulitis. 3. Cystoprostatectomy, with unremarkable urinary diversion right lower quadrant. 4.  Aortic Atherosclerosis (ICD10-I70.0). Electronically Signed   By: Ozell Daring M.D.   On: 01/23/2024 15:50   DG Chest Port 1 View Result Date: 01/23/2024 CLINICAL DATA:  Shortness of breath EXAM: PORTABLE CHEST 1 VIEW COMPARISON:  Chest radiograph August 13, 2021 FINDINGS: The heart size is borderline normal. Mediastinal contours are within normal limits. Both lungs are clear. Aortic knob is prominent. Thoracic aorta is tortuous. Degenerative changes of the spine. No pleural effusion or pneumothorax. IMPRESSION: No active disease. Electronically Signed   By: Megan  Zare M.D.   On: 01/23/2024 11:16    Impression / Plan:  Mr. Kramp is an 89 year old African-American male with a past medical history including hypertension,hyperlipidemia, GERD, diabetes type 2 and urothelial cancer with urinary diversion into the right lower quadrant, who presented to the hospital on 01/23/2024 with a complaint of chest found to have elevated LFTs.  Impression: 1.  Elevated LFTs: With AST/ALT 890/353 and alk phos 174 at admission--> AST/ALT 250/259 and alk phos 159 today, MRI/MRCP, CTAP and right upper quadrant ultrasound with no etiology, no history of heavy alcohol use, acute hepatitis panel nonreactive, acetaminophen  level less than 10, PT/INR normal, no supplement use, no IV drugs, no recent antibiotics; consider relation to possible ischemic hepatitis or shock liver from chest pain (though hospitalist team does not feel like this was cardiac in origin troponins were minimally elevated) 2.  Chest pressure: Patient describes chest pressure with unclear etiology that woke him from his sleep, troponin slightly elevated to 31 in the setting of CKD,  working diagnosis reflux per hospitalist team 3.  Diabetes type 2 4.  CKD stage III: Renal function at baseline  5.  GERD: On Omeprazole with occasional breakthrough requiring Mylanta per patient  Plan: 1.  Thankfully liver enzymes are trending down today.  Patient's symptoms have gone away.  Imaging is all normal.  Will discuss further with Dr. Avram.  May need to add additional liver serologies pending trend. 2.  Continue supportive measures 3.  Okay with current diet  Thank you for your kind consultation, we will continue to follow.  Delon Gibson Lemmon  01/24/2024, 9:03 AM    [facility] Attending   I have taken an interval history, reviewed the chart and examined the patient. I agree with the Advanced Practitioner's note, impression and recommendations with the following additions:  Chest pressure and as best we can tell acute rise in transaminases and alk phos which is improving.  Chest pressure has resolved.  Acute hepatitis serologies negative.  I wonder if he did not pass a stone from the gallbladder/bile duct.  He feels well at this time.  I think he has reached maximal hospital benefit and can be [facility], specifically to follow-up LFTs and document that they normalize.  If they do not then could initiate additional abnormal LFT workup.  He could be referred back to our clinic if necessary and approved by the [place].  Lupita CHARLENA Avram, MD, [facility] See [facility]ACE[facility] on call - gastroenterology for best contact person 01/24/2024 3:09 PM

## 2024-01-24 NOTE — Plan of Care (Signed)

## 2024-01-24 NOTE — TOC Initial Note (Signed)
 Transition of Care [facility]) - Initial/Assessment Note    Patient Details  Name: [name] [name] MRN: [id number] Date of Birth: January 09, 1934  Transition of Care [facility]) CM/SW Contact:    Doneta Glenys DASEN, RN Phone Number: 01/24/2024, 3:58 PM  Clinical Narrative:                 Presented for SOB. CM spoke with patient I the room. PCP/insurance verified;DME-cane,rollator;denies HH,oxygen;Jon will transport home at discharge. Consult entered for medication assistance however patient has adequate insurance. No additional IP CM needs.  Expected Discharge Plan: Home/Self Care Barriers to Discharge: No Barriers Identified   Patient Goals and CMS Choice Patient states their goals for this hospitalization and ongoing recovery are:: home   Choice offered to / list presented to : NA [place] ownership interest in [facility].provided to:: Parent NA    Expected Discharge Plan and Services In-house Referral: NA Discharge Planning Services: CM Consult, Medication Assistance Post Acute Care Choice: NA Living arrangements for the past 2 months: Apartment                 DME Arranged: N/A DME Agency: NA       HH Arranged: NA HH Agency: NA        Prior Living Arrangements/Services Living arrangements for the past 2 months: Apartment Lives with:: Self Patient language and need for interpreter reviewed:: Yes Do you feel safe going back to the place where you live?: Yes      Need for Family Participation in Patient Care: No (Comment) Care giver support system in place?: Yes (comment) Current home services: DME (cane, rollator) Criminal Activity/Legal Involvement Pertinent to Current Situation/Hospitalization: No - Comment as needed  Activities of Daily Living   ADL Screening (condition at time of admission) Independently performs ADLs?: Yes (appropriate for developmental age) Is the patient deaf or have difficulty hearing?: No Does the patient have difficulty seeing,  even when wearing glasses/contacts?: No Does the patient have difficulty concentrating, remembering, or making decisions?: No  Permission Sought/Granted Permission sought to share information with : Case Manager Permission granted to share information with : Yes, Verbal Permission Granted  Share Information with NAME: REID,ANGELA Pricilla)  [phone]           Emotional Assessment Appearance:: Appears stated age Attitude/Demeanor/Rapport: Engaged Affect (typically observed): Appropriate Orientation: : Oriented to Self, Oriented to Place, Oriented to  Time, Oriented to Situation Alcohol / Substance Use: Not Applicable Psych Involvement: No (comment)  Admission diagnosis:  Epigastric pain [R10.13] LFT elevation [R79.89] Acute hepatic encephalopathy (HCC) [K76.82] Chest pain, unspecified type [R07.9] Patient Active Problem List   Diagnosis Date Noted   Acute hepatic encephalopathy (HCC) 01/23/2024   Stage 3b chronic kidney disease (CKD) (HCC) 08/13/2021   Borderline glaucoma, open angle with borderline findings 10/07/2020   Cataract 10/07/2020   History of colonic polyps 10/07/2020   Impingement syndrome of shoulder region 10/07/2020   Iron deficiency anemia 10/07/2020   Low back pain 10/07/2020   Primary osteoarthritis, left shoulder 10/07/2020   Rash 04/10/2018   Dizziness 11/24/2017   Allergic rhinitis 03/04/2015   Peripheral edema 03/04/2015   Neck pain 02/18/2015   Routine general medical examination at a health care facility 09/26/2014   Mixed diabetic hyperlipidemia associated with type 1 diabetes mellitus (HCC) 03/23/2010   OBESITY, CLASS III 08/30/2008   Osteoarthrosis, unspecified whether generalized or localized, involving lower leg 08/29/2008   Malignant neoplasm of bladder (HCC) 01/23/2007   Type 1 diabetes mellitus (  HCC) 01/23/2007   Essential hypertension 01/23/2007   GERD 01/23/2007   PCP:  Rollene Almarie LABOR, MD Pharmacy:   [facility] [phone], [place] - [address] [place] [address] Phone: [phone] Fax: [phone]     Social Drivers of Health (SDOH) Social History: SDOH Screenings   Food Insecurity: No Food Insecurity (01/23/2024)  Housing: Low Risk  (01/23/2024)  Transportation Needs: Unmet Transportation Needs (01/23/2024)  Utilities: Not At Risk (01/23/2024)  Depression (PHQ2-9): Low Risk  (03/06/2023)  Social Connections: Socially Isolated (01/23/2024)  Tobacco Use: Medium Risk (01/23/2024)   SDOH Interventions:     Readmission Risk Interventions    01/24/2024    3:56 PM  Readmission Risk Prevention Plan  Post Dischage Appt Complete  Medication Screening Complete  Transportation Screening Complete

## 2024-01-24 NOTE — Discharge Instructions (Addendum)
 Continue to hold your home simvastatin  on discharge until follow-up with PCP/[facility].  Will need repeat liver function test in 1-2 weeks.  Also if would like to be seen by [facility] gastroenterology, will need a referral from your PCP or the [place].

## 2024-01-24 NOTE — Discharge Summary (Signed)
 Physician Discharge Summary  [name] FMW:[id number] DOB: 1933/06/23 DOA: 01/23/2024  PCP: Rollene Almarie LABOR, MD  Admit date: 01/23/2024 Discharge date: 01/24/2024  Admitted From: Home Disposition: Home  Recommendations for Outpatient Follow-up:  Follow up with PCP in 1-2 weeks Recommend repeat LFTs/CMP 1-2 weeks to ensure liver functions returned to normal limits Continue to hold home statin until repeat labs performed, also avoid hepatotoxins such as Tylenol  Per GI, will need referral if wants to be followed by [facility] gastroenterology after discharge [facility] or [facility] Needs further optimization of diabetic control given elevated hemoglobin A1c of 9.9  Home Health: No Equipment/Devices: None  Discharge Condition: Stable CODE STATUS: DNR Diet recommendation: Heart healthy/consistent carbohydrate diet  History of present illness:  [name] [name] is a 89 y.o. male with past medical history significant for HTN, HLD, DM2, GERD, CKD Stage 3b, history of urothelial cancer s/p chemo/radiation and urinary diversion who presented to [facility] ED on 01/23/2024 with complaints of chest tightness/pressure, shortness of breath.  Onset 2 AM day of admission.  Reports sensation of heaviness to middle lower part of his chest, awoken from sleep; and reports similar symptoms when he has reflux in the past.  Reportedly takes Tylenol  occasionally for joint pains, 2-3 times per week.  Denies alcohol use in several months because it gives him heartburn.  No previous history of liver disease.   In the ED, temperature 98.3 F, HR 73, RR 22, BP 150/78, SpO2 100% on room air.  WBC 4.3, hemoglobin 8.6, platelet count 250.  Sodium 135, potassium 4.0, chloride 104, CO2 15, glucose 237, BUN 23, creatinine 1.95.  AST 890, ALT 353, total bilirubin 0.9.  BNP 248.0.  High-sensitivity troponin 31>31; flat.  Lipase 70.  Acetaminophen  level less than 10.  Chest x-ray with no active cardiopulmonary disease process.  CT  abdomen/pelvis with contrast with no acute intra-abdominal/intrapelvic process, distal colonic diverticulosis without diverticulitis, noted cystoprostatectomy with unremarkable urinary diversion RLQ.  GI was consulted who recommended MRCP.  TRH consulted for admission for further evaluation and management of transaminitis.  Hospital course:  Transaminitis: Improving Patient presenting with elevated LFTs, utilizes Tylenol  occasionally 2-3 times per week outpatient for joint pains.  AST and ALT initially elevated 890 and 353 respectively.  Bilirubin within normal limits.  Acetaminophen  level less than 10.  Acute hepatitis panel negative.  CT abdomen/pelvis without acute findings.  MRCP with no significant hepatic steatosis or focal liver lesion, no biliary dilation.  Gastroenterology was consulted and followed during hospital course.  LFTs continue to trend down.  Will continue to hold home simvastatin , avoid hepatotoxins such as Tylenol .  GI recommends outpatient follow-up with [facility]CP/[facility] with repeat labs 1-2 weeks.   Chest pressure: Resolved Patient initially describing heaviness to his middle lower portion of his chest on admission; similar to previous reflux events.  High-sensitivity troponin elevated to 31 followed by 31 on admission in setting of CKD, flat.  EKG with NSR, no ST elevation/depressions.  Remains asymptomatic. -- Monitor on telemetry   HTN Currently not on antihypertensives outpatient. BP 138/81, stable   HLD Holding home simvastatin  due to elevated LFTs as above   DM2, with hyperglycemia Hemoglobin A1c 9.9, poorly controlled.  At baseline on Lantus  44 units daily, NovoLog  12 units before breakfast/supper and 10 units at 11 PM.  Continue Jardiance.  Outpatient follow-up with PCP for further guidance.   CKD stage IIIb History of urothelial cancer s/p chemotherapy/radiation, s/p urinary diversion Cr 1.95>1.68, stable (Baseline Cr 1.7-1.9).  Outpatient  follow-up with urology.    Obesity, class I Body mass index is 33.47 kg/m.  Discharge Diagnoses:  Principal Problem:   Acute hepatic encephalopathy [facility])    Discharge Instructions  Discharge Instructions     Call MD for:  difficulty breathing, headache or visual disturbances   Complete by: As directed    Call MD for:  extreme fatigue   Complete by: As directed    Call MD for:  persistant dizziness or light-headedness   Complete by: As directed    Call MD for:  persistant nausea and vomiting   Complete by: As directed    Call MD for:  severe uncontrolled pain   Complete by: As directed    Call MD for:  temperature >100.4   Complete by: As directed    Diet - low sodium heart healthy   Complete by: As directed    Increase activity slowly   Complete by: As directed       Allergies as of 01/24/2024       Reactions   Iron Dermatitis   Lisinopril Swelling, Other (See Comments)   Facial swelling   Ace Inhibitors Dermatitis   Ferrous Sulfate Other (See Comments)   Constipation   Glipizide Rash, Dermatitis        Medication List     PAUSE taking these medications    simvastatin  20 MG tablet Wait to take this until your doctor or other care provider tells you to start again. Commonly known as: ZOCOR  Take 20 mg by mouth at bedtime.       STOP taking these medications    acetaminophen  325 MG tablet Commonly known as: TYLENOL    clotrimazole -betamethasone  cream Commonly known as: Lotrisone    guaiFENesin -dextromethorphan  100-10 MG/5ML syrup Commonly known as: ROBITUSSIN DM   mupirocin  ointment 2 % Commonly known as: BACTROBAN        TAKE these medications    aspirin  325 MG tablet Take 1 tablet (325 mg total) by mouth daily.   bismuth  subsalicylate 262 MG/15ML suspension Commonly known as: PEPTO BISMOL Take 30 mLs by mouth every 6 (six) hours as needed for diarrhea or loose stools.   Carboxymethylcellulose Sod PF 0.5 % Soln Place 1 drop into both eyes in the morning, at  noon, and at bedtime.   cetirizine 10 MG tablet Commonly known as: ZYRTEC Take 10 mg by mouth daily.   empagliflozin 25 MG Tabs tablet Commonly known as: JARDIANCE Take 12.5 mg by mouth in the morning.   FREESTYLE LITE test strip Generic drug: glucose blood Use as instructed   insulin  aspart 100 UNIT/ML FlexPen Commonly known as: NOVOLOG  Inject 10-12 Units into the skin See admin instructions. Inject 12 units into the skin before breakfast & supper and 10 units at 11 PM   insulin  glargine 100 UNIT/ML Solostar Pen Commonly known as: LANTUS  Inject 44 Units into the skin in the morning.   ketotifen 0.025 % ophthalmic solution Commonly known as: ZADITOR Place 1 drop into both eyes 2 (two) times daily as needed (for inflammation).   magnesium  hydroxide 400 MG/5ML suspension Commonly known as: MILK OF MAGNESIA Take 30 mLs by mouth daily as needed for moderate constipation.   omeprazole 20 MG capsule Commonly known as: PRILOSEC Take 20 mg by mouth 2 (two) times daily before a meal.   Vitamin D -3 25 MCG (1000 UT) Caps Take 1,000 Units by mouth daily with breakfast.        Follow-up Information     Rollene Almarie LABOR,  MD. Schedule an appointment as soon as possible for a visit in 1 week(s).   Specialty: Internal Medicine Contact information: [address] [place] [address] [phone]         [facility] Schedule an appointment as soon as possible for a visit.   Why: Need repeat CMP performed.  Also if wants to be seen by [facility], will need outpatient referral. Contact information: [address] [facility] [place] [address] [phone]                Allergies  Allergen Reactions   Iron Dermatitis   Lisinopril Swelling and Other (See Comments)    Facial swelling   Ace Inhibitors Dermatitis   Ferrous Sulfate Other (See Comments)    Constipation   Glipizide Rash and Dermatitis    Consultations: [facility]   Procedures/Studies: MR ABDOMEN MRCP W WO CONTAST Result Date: 01/23/2024 CLINICAL DATA:  Right upper quadrant pain and elevated liver function tests EXAM: MRI ABDOMEN WITHOUT AND WITH CONTRAST (INCLUDING MRCP) TECHNIQUE: Multiplanar multisequence MR imaging of the abdomen was performed both before and after the administration of intravenous contrast. Heavily T2-weighted images of the biliary and pancreatic ducts were obtained, and three-dimensional MRCP images were rendered by post processing. CONTRAST:  10mL GADAVIST  GADOBUTROL  1 MMOL/ML IV SOLN COMPARISON:  Ultrasound abdomen January 23, 2024 CT abdomen pelvis January 23, 2024. FINDINGS: Lower chest: Unremarkable. Hepatobiliary/MRCP: Liver measures 16.9 cm in craniocaudal dimension. No significant hepatic steatosis. No focal liver lesion. Gallbladder is normal. Intra and extrahepatic bile ducts appear normal. CBD measures 2.3 cm. Partial visualization of the diverted ureters on MRCP views with normal diameter. Pancreas: No pancreatic abnormality or ductal dilatation. No mass lesion or peripancreatic fluid collection. Spleen:  Spleen is normal in size. Adrenals/Urinary Tract: Left adrenal nodules consistent with lipid rich adenoma, signal drop on out of phase sequence measuring 1.8 cm, stable to prior. Right adrenal is normal. Multiple T1 hyperintense renal cysts consistent with cysts containing proteinaceous material/blood products. Additional few simple appearing T2 hyperintense cysts in both kidneys which does not require imaging follow-up. No hydronephrosis.Exam does not include pelvic cavity. Please refer to same day CT pelvis. Stomach/Bowel: Stomach is unremarkable. Diverticulosis without diverticulitis. Small and large bowel loops appear nondilated and unremarkable. Vascular/Lymphatic: No lymphadenopathy. Vascular structures are normal. Musculoskeletal: No suspicious osseous lesion. IMPRESSION: Left adrenal lipid rich adenoma which  does not require imaging follow-up. Bilateral T1 and T2 hyperintense renal cysts without abnormal enhancement (Bosniak 2). These lesions does not require imaging follow-up. No significant hepatic steatosis or focal liver lesion. No biliary dilatation. Diverticulosis. Please refer to same day CT pelvis for further details of postsurgical changes of cystectomy and urinary diversion. Electronically Signed   By: Megan  Zare M.D.   On: 01/23/2024 19:49   MR 3D Recon At Scanner Result Date: 01/23/2024 CLINICAL DATA:  Right upper quadrant pain and elevated liver function tests EXAM: MRI ABDOMEN WITHOUT AND WITH CONTRAST (INCLUDING MRCP) TECHNIQUE: Multiplanar multisequence MR imaging of the abdomen was performed both before and after the administration of intravenous contrast. Heavily T2-weighted images of the biliary and pancreatic ducts were obtained, and three-dimensional MRCP images were rendered by post processing. CONTRAST:  10mL GADAVIST  GADOBUTROL  1 MMOL/ML IV SOLN COMPARISON:  Ultrasound abdomen January 23, 2024 CT abdomen pelvis January 23, 2024. FINDINGS: Lower chest: Unremarkable. Hepatobiliary/MRCP: Liver measures 16.9 cm in craniocaudal dimension. No significant hepatic steatosis. No focal liver lesion. Gallbladder is normal. Intra and extrahepatic bile ducts  appear normal. CBD measures 2.3 cm. Partial visualization of the diverted ureters on MRCP views with normal diameter. Pancreas: No pancreatic abnormality or ductal dilatation. No mass lesion or peripancreatic fluid collection. Spleen:  Spleen is normal in size. Adrenals/Urinary Tract: Left adrenal nodules consistent with lipid rich adenoma, signal drop on out of phase sequence measuring 1.8 cm, stable to prior. Right adrenal is normal. Multiple T1 hyperintense renal cysts consistent with cysts containing proteinaceous material/blood products. Additional few simple appearing T2 hyperintense cysts in both kidneys which does not require imaging  follow-up. No hydronephrosis.Exam does not include pelvic cavity. Please refer to same day CT pelvis. Stomach/Bowel: Stomach is unremarkable. Diverticulosis without diverticulitis. Small and large bowel loops appear nondilated and unremarkable. Vascular/Lymphatic: No lymphadenopathy. Vascular structures are normal. Musculoskeletal: No suspicious osseous lesion. IMPRESSION: Left adrenal lipid rich adenoma which does not require imaging follow-up. Bilateral T1 and T2 hyperintense renal cysts without abnormal enhancement (Bosniak 2). These lesions does not require imaging follow-up. No significant hepatic steatosis or focal liver lesion. No biliary dilatation. Diverticulosis. Please refer to same day CT pelvis for further details of postsurgical changes of cystectomy and urinary diversion. Electronically Signed   By: Megan  Zare M.D.   On: 01/23/2024 19:49   US  Abdomen Limited RUQ (LIVER/GB) Result Date: 01/23/2024 CLINICAL DATA:  Pain. EXAM: ULTRASOUND ABDOMEN LIMITED RIGHT UPPER QUADRANT COMPARISON:  None Available. FINDINGS: Gallbladder: No gallstones are visualized. The gallbladder wall measures 3.8 mm in thickness. No sonographic Murphy sign noted by sonographer. Common bile duct: Diameter: N/A  (The common bile duct is not visualized.) Liver: No focal lesion identified. Within normal limits in parenchymal echogenicity. Portal vein is patent on color Doppler imaging with normal direction of blood flow towards the liver. Other: The study is technically limited secondary to the patient's body habitus, as per the ultrasound technologist. IMPRESSION: 1. Limited study with subsequently limited visualization of the common bile duct. 2. No evidence of cholelithiasis or acute cholecystitis. Electronically Signed   By: Suzen Dials M.D.   On: 01/23/2024 16:52   CT ABDOMEN PELVIS W CONTRAST Result Date: 01/23/2024 CLINICAL DATA:  Right upper quadrant pain, elevated LFTs, chest pressure EXAM: CT ABDOMEN AND PELVIS  WITH CONTRAST TECHNIQUE: Multidetector CT imaging of the abdomen and pelvis was performed using the standard protocol following bolus administration of intravenous contrast. RADIATION DOSE REDUCTION: This exam was performed according to the departmental dose-optimization program which includes automated exposure control, adjustment of the mA and/or kV according to patient size and/or use of iterative reconstruction technique. CONTRAST:  80mL OMNIPAQUE  IOHEXOL  300 MG/ML  SOLN COMPARISON:  08/12/2021 FINDINGS: Lower chest: No acute pleural or parenchymal lung disease. Hepatobiliary: No focal liver abnormality is seen. No gallstones, gallbladder wall thickening, or biliary dilatation. Pancreas: Unremarkable. No pancreatic ductal dilatation or surrounding inflammatory changes. Spleen: Normal in size without focal abnormality. Adrenals/Urinary Tract: Postsurgical changes from cystectomy and right lower quadrant urinary diversion. Simple appearing cortical and peripelvic renal cysts are identified, which do not require specific imaging follow-up. No urinary tract calculi or obstructive uropathy. Stable left adrenal nodularity consistent with benign adenoma, no follow-up recommended. Right adrenal is unremarkable. Stomach/Bowel: No bowel obstruction or ileus. Distal colonic diverticulosis without diverticulitis. Nondilated ileal conduit in the right lower quadrant, in this patient with prior cystectomy. Vascular/Lymphatic: Aortic atherosclerosis. No enlarged abdominal or pelvic lymph nodes. Reproductive: Prior prostatectomy. No abnormalities within the prostate bed. Other: No free fluid or free intraperitoneal gas. No abdominal wall hernia. Musculoskeletal: No acute or destructive  bony abnormalities. Reconstructed images demonstrate no additional findings. IMPRESSION: 1. No acute intra-abdominal or intrapelvic process. 2. Distal colonic diverticulosis without diverticulitis. 3. Cystoprostatectomy, with unremarkable urinary  diversion right lower quadrant. 4.  Aortic Atherosclerosis (ICD10-I70.0). Electronically Signed   By: Ozell Daring M.D.   On: 01/23/2024 15:50   DG Chest Port 1 View Result Date: 01/23/2024 CLINICAL DATA:  Shortness of breath EXAM: PORTABLE CHEST 1 VIEW COMPARISON:  Chest radiograph August 13, 2021 FINDINGS: The heart size is borderline normal. Mediastinal contours are within normal limits. Both lungs are clear. Aortic knob is prominent. Thoracic aorta is tortuous. Degenerative changes of the spine. No pleural effusion or pneumothorax. IMPRESSION: No active disease. Electronically Signed   By: Megan  Zare M.D.   On: 01/23/2024 11:16     Subjective: Patient seen examined bedside, sitting edge of bed eating breakfast. No complaints this morning. Discussed downtrending LFTs, currently holding his home statin. Awaiting further GI input. No other questions or concerns at this time. Denies headache, no dizziness, no chest pain, no palpitations, no shortness of breath, no abdominal pain, no fever/chills/night sweats, no nausea/vomiting/diarrhea, no focal weakness, no fatigue, no paresthesia. No acute events overnight per nursing staff.  Okay to discharge home with outpatient follow-up per GI with downtrending LFTs.  Discharge Exam: Vitals:   01/24/24 0327 01/24/24 0753  BP: 138/81 (!) 152/81  Pulse: 65 60  Resp: 18 16  Temp: 98.1 F (36.7 C) 98.1 F (36.7 C)  SpO2:     Vitals:   01/23/24 1944 01/23/24 2334 01/24/24 0327 01/24/24 0753  BP: (!) 178/92 (!) 141/78 138/81 (!) 152/81  Pulse: 66 63 65 60  Resp: 18 16 18 16   Temp: 98.1 F (36.7 C) 98.1 F (36.7 C) 98.1 F (36.7 C) 98.1 F (36.7 C)  TempSrc: Oral     SpO2: 100%     Weight:      Height:        Physical Exam: GEN: NAD, alert and oriented x 3, obese HEENT: NCAT, PERRL, sclera clear, MMM PULM: CTAB w/o wheezes/crackles, normal respiratory effort, on room air CV: RRR w/o M/G/R GI: abd soft, NTND, + BS MSK: no peripheral edema,  moves all extremities dependently NEURO: CN II-XII intact, no focal deficits, sensation to light touch intact PSYCH: normal mood/affect Integumentary: No concerning rashes/lesions/wounds noted on exposed skin surfaces    The results of significant diagnostics from this hospitalization (including imaging, microbiology, ancillary and laboratory) are listed below for reference.     Microbiology: No results found for this or any previous visit (from the past 240 hours).   Labs: BNP (last 3 results) No results for input(s): BNP in the last 8760 hours. Basic Metabolic Panel: Recent Labs  Lab 01/23/24 1010 01/24/24 0431  NA 135 137  K 4.0 3.8  CL 104 105  CO2 15* 21*  GLUCOSE 237* 162*  BUN 23 19  CREATININE 1.95* 1.68*  CALCIUM 9.4 9.3   Liver Function Tests: Recent Labs  Lab 01/23/24 1014 01/24/24 0431  AST 890* 254*  ALT 353* 259*  ALKPHOS 174* 159*  BILITOT 0.9 0.8  PROT 7.6 7.2  ALBUMIN 3.9 3.7   Recent Labs  Lab 01/23/24 1313  LIPASE 70*   No results for input(s): AMMONIA in the last 168 hours. CBC: Recent Labs  Lab 01/23/24 1010 01/24/24 0431  WBC 4.3 4.8  HGB 8.6* 8.7*  HCT 31.5* 30.3*  MCV 85.8 84.6  PLT 250 226   Cardiac Enzymes: No results  for input(s): CKTOTAL, CKMB, CKMBINDEX, TROPONINI in the last 168 hours. BNP: Invalid input(s): POCBNP CBG: Recent Labs  Lab 01/23/24 1659 01/23/24 2013 01/24/24 0754 01/24/24 1155  GLUCAP 168* 149* 135* 269*   D-Dimer No results for input(s): DDIMER in the last 72 hours. Hgb A1c Recent Labs    01/23/24 2056  HGBA1C 9.9*   Lipid Profile No results for input(s): CHOL, HDL, LDLCALC, TRIG, CHOLHDL, LDLDIRECT in the last 72 hours. Thyroid  function studies No results for input(s): TSH, T4TOTAL, T3FREE, THYROIDAB in the last 72 hours.  Invalid input(s): FREET3 Anemia work up No results for input(s): VITAMINB12, FOLATE, FERRITIN, TIBC, IRON,  RETICCTPCT in the last 72 hours. Urinalysis    Component Value Date/Time   COLORURINE YELLOW 08/13/2021 1039   APPEARANCEUR CLEAR 08/13/2021 1039   LABSPEC 1.006 08/13/2021 1039   PHURINE 6.0 08/13/2021 1039   GLUCOSEU NEGATIVE 08/13/2021 1039   HGBUR MODERATE (A) 08/13/2021 1039   BILIRUBINUR NEGATIVE 08/13/2021 1039   KETONESUR NEGATIVE 08/13/2021 1039   PROTEINUR 100 (A) 08/13/2021 1039   UROBILINOGEN Normal 12/22/2016 0000   NITRITE NEGATIVE 08/13/2021 1039   LEUKOCYTESUR LARGE (A) 08/13/2021 1039   Sepsis Labs Recent Labs  Lab 01/23/24 1010 01/24/24 0431  WBC 4.3 4.8   Microbiology No results found for this or any previous visit (from the past 240 hours).   Time coordinating discharge: Over 30 minutes  SIGNED:   Camellia PARAS [place], DO  [facility] 01/24/2024, 4:16 PM

## 2024-01-24 NOTE — Progress Notes (Signed)
 PROGRESS NOTE    [name] [name]  FMW:[id number] DOB: 1933/10/03 DOA: 01/23/2024 PCP: Rollene Almarie LABOR, MD    Brief Narrative:   [name] [name] is a 89 y.o. male with past medical history significant for HTN, HLD, DM2, GERD, CKD Stage 3b, history of urothelial cancer s/p chemo/radiation and urinary diversion who presented to [facility] ED on 01/23/2024 with complaints of chest tightness/pressure, shortness of breath.  Onset 2 AM day of admission.  Reports sensation of heaviness to middle lower part of his chest, awoken from sleep; and reports similar symptoms when he has reflux in the past.  Reportedly takes Tylenol  occasionally for joint pains, 2-3 times per week.  Denies alcohol use in several months because it gives him heartburn.  No previous history of liver disease.  In the ED, temperature 98.3 F, HR 73, RR 22, BP 150/78, SpO2 100% on room air.  WBC 4.3, hemoglobin 8.6, platelet count 250.  Sodium 135, potassium 4.0, chloride 104, CO2 15, glucose 237, BUN 23, creatinine 1.95.  AST 890, ALT 353, total bilirubin 0.9.  BNP 248.0.  High-sensitivity troponin 31>31; flat.  Lipase 70.  Acetaminophen  level less than 10.  Chest x-ray with no active cardiopulmonary disease process.  CT abdomen/pelvis with contrast with no acute intra-abdominal/intrapelvic process, distal colonic diverticulosis without diverticulitis, noted cystoprostatectomy with unremarkable urinary diversion RLQ.  GI was consulted who recommended MRCP.  TRH consulted for admission for further evaluation and management of transaminitis.  Assessment & Plan:   Transaminitis Patient presenting with elevated LFTs, utilizes Tylenol  occasionally 2-3 times per week outpatient for joint pains.  AST and ALT initially elevated 890 and 353 respectively.  Bilirubin within normal limits.  Acetaminophen  level less than 10.  Acute hepatitis panel negative.  CT abdomen/pelvis without acute findings.  MRCP with no significant hepatic steatosis or focal  liver lesion, no biliary dilation. -- [facility] following; appreciate assistance -- AST 890>254 -- ALT 353>259 -- Tbili 0.9>0.8 -- Avoid hepatotoxins; holding home simvastatin  -- CMP daily -- Await further recommendations from GI  Chest pressure: Resolved Patient initially describing heaviness to his middle lower portion of his chest on admission; similar to previous reflux events.  High-sensitivity troponin elevated to 31 followed by 31 on admission in setting of CKD, flat.  EKG with NSR, no ST elevation/depressions.  Remains asymptomatic. -- Monitor on telemetry  HTN Currently not on antihypertensives outpatient. -- BP 138/81, stable -- Continue to monitor off of antihypertensives  HLD -- Holding home simvastatin  due to elevated LFTs as above  DM2, with hyperglycemia Hemoglobin A1c 9.9, poorly controlled.  At baseline on Lantus  44 units daily, NovoLog  12 units before breakfast/supper and 10 units at 11 PM -- Lantus  44u Verdon daily -- Moderate SSI for coverage -- CBGs qAC/HS  CKD stage IIIb History of urothelial cancer s/p chemotherapy/radiation, s/p urinary diversion -- Cr 1.95>1.68, stable (Baseline Cr 1.7-1.9) -- Avoid nephrotoxins, renally dose all medication -- CMP in a.m. -- Outpatient follow-up with urology  Obesity, class I Body mass index is 33.47 kg/m.   DVT prophylaxis: SCDs Start: 01/23/24 1807    Code Status: Limited: Do not attempt resuscitation (DNR) -DNR-LIMITED -Do Not Intubate/DNI  Family Communication: No family present at bedside this morning  Disposition Plan:  Level of care: Med-Surg Status is: Inpatient Remains inpatient appropriate because: Monitoring of LFTs, further recommendations from GI    Consultants:  [facility]  Procedures:  none  Antimicrobials:  none   Subjective: Patient seen examined bedside, sitting edge of bed  eating breakfast.  No complaints this morning.  Discussed downtrending LFTs, currently holding his home statin.   Awaiting further GI input.  No other questions or concerns at this time.  Denies headache, no dizziness, no chest pain, no palpitations, no shortness of breath, no abdominal pain, no fever/chills/night sweats, no nausea/vomit/diarrhea, no focal weakness, no fatigue, no paresthesia.  No acute events overnight per nursing staff.  Objective: Vitals:   01/23/24 1944 01/23/24 2334 01/24/24 0327 01/24/24 0753  BP: (!) 178/92 (!) 141/78 138/81 (!) 152/81  Pulse: 66 63 65 60  Resp: 18 16 18 16   Temp: 98.1 F (36.7 C) 98.1 F (36.7 C) 98.1 F (36.7 C) 98.1 F (36.7 C)  TempSrc: Oral     SpO2: 100%     Weight:      Height:        Intake/Output Summary (Last 24 hours) at 01/24/2024 1240 Last data filed at 01/24/2024 0838 Gross per 24 hour  Intake 740 ml  Output 3200 ml  Net -2460 ml   Filed Weights   01/23/24 1016  Weight: 108.9 kg    Examination:  Physical Exam: GEN: NAD, alert and oriented x 3, obese HEENT: NCAT, PERRL, sclera clear, MMM PULM: CTAB w/o wheezes/crackles, normal respiratory effort, on room air CV: RRR w/o M/G/R GI: abd soft, NTND, + BS MSK: no peripheral edema, moves all extremities dependently NEURO: CN II-XII intact, no focal deficits, sensation to light touch intact PSYCH: normal mood/affect Integumentary: No concerning rashes/lesions/wounds noted on exposed skin surfaces    Data Reviewed: I have personally reviewed following labs and imaging studies  CBC: Recent Labs  Lab 01/23/24 1010 01/24/24 0431  WBC 4.3 4.8  HGB 8.6* 8.7*  HCT 31.5* 30.3*  MCV 85.8 84.6  PLT 250 226   Basic Metabolic Panel: Recent Labs  Lab 01/23/24 1010 01/24/24 0431  NA 135 137  K 4.0 3.8  CL 104 105  CO2 15* 21*  GLUCOSE 237* 162*  BUN 23 19  CREATININE 1.95* 1.68*  CALCIUM 9.4 9.3   GFR: Estimated Creatinine Clearance: 38.1 mL/min (A) (by C-G formula based on SCr of 1.68 mg/dL (H)). Liver Function Tests: Recent Labs  Lab 01/23/24 1014 01/24/24 0431  AST  890* 254*  ALT 353* 259*  ALKPHOS 174* 159*  BILITOT 0.9 0.8  PROT 7.6 7.2  ALBUMIN 3.9 3.7   Recent Labs  Lab 01/23/24 1313  LIPASE 70*   No results for input(s): AMMONIA in the last 168 hours. Coagulation Profile: Recent Labs  Lab 01/23/24 1313  INR 1.0   Cardiac Enzymes: No results for input(s): CKTOTAL, CKMB, CKMBINDEX, TROPONINI in the last 168 hours. BNP (last 3 results) Recent Labs    01/23/24 1014  PROBNP 248.0   HbA1C: Recent Labs    01/23/24 2056  HGBA1C 9.9*   CBG: Recent Labs  Lab 01/23/24 1659 01/23/24 2013 01/24/24 0754 01/24/24 1155  GLUCAP 168* 149* 135* 269*   Lipid Profile: No results for input(s): CHOL, HDL, LDLCALC, TRIG, CHOLHDL, LDLDIRECT in the last 72 hours. Thyroid  Function Tests: No results for input(s): TSH, T4TOTAL, FREET4, T3FREE, THYROIDAB in the last 72 hours. Anemia Panel: No results for input(s): VITAMINB12, FOLATE, FERRITIN, TIBC, IRON, RETICCTPCT in the last 72 hours. Sepsis Labs: No results for input(s): PROCALCITON, LATICACIDVEN in the last 168 hours.  No results found for this or any previous visit (from the past 240 hours).       Radiology Studies: MR ABDOMEN MRCP W WO CONTAST  Result Date: 01/23/2024 CLINICAL DATA:  Right upper quadrant pain and elevated liver function tests EXAM: MRI ABDOMEN WITHOUT AND WITH CONTRAST (INCLUDING MRCP) TECHNIQUE: Multiplanar multisequence MR imaging of the abdomen was performed both before and after the administration of intravenous contrast. Heavily T2-weighted images of the biliary and pancreatic ducts were obtained, and three-dimensional MRCP images were rendered by post processing. CONTRAST:  10mL GADAVIST  GADOBUTROL  1 MMOL/ML IV SOLN COMPARISON:  Ultrasound abdomen January 23, 2024 CT abdomen pelvis January 23, 2024. FINDINGS: Lower chest: Unremarkable. Hepatobiliary/MRCP: Liver measures 16.9 cm in craniocaudal dimension. No  significant hepatic steatosis. No focal liver lesion. Gallbladder is normal. Intra and extrahepatic bile ducts appear normal. CBD measures 2.3 cm. Partial visualization of the diverted ureters on MRCP views with normal diameter. Pancreas: No pancreatic abnormality or ductal dilatation. No mass lesion or peripancreatic fluid collection. Spleen:  Spleen is normal in size. Adrenals/Urinary Tract: Left adrenal nodules consistent with lipid rich adenoma, signal drop on out of phase sequence measuring 1.8 cm, stable to prior. Right adrenal is normal. Multiple T1 hyperintense renal cysts consistent with cysts containing proteinaceous material/blood products. Additional few simple appearing T2 hyperintense cysts in both kidneys which does not require imaging follow-up. No hydronephrosis.Exam does not include pelvic cavity. Please refer to same day CT pelvis. Stomach/Bowel: Stomach is unremarkable. Diverticulosis without diverticulitis. Small and large bowel loops appear nondilated and unremarkable. Vascular/Lymphatic: No lymphadenopathy. Vascular structures are normal. Musculoskeletal: No suspicious osseous lesion. IMPRESSION: Left adrenal lipid rich adenoma which does not require imaging follow-up. Bilateral T1 and T2 hyperintense renal cysts without abnormal enhancement (Bosniak 2). These lesions does not require imaging follow-up. No significant hepatic steatosis or focal liver lesion. No biliary dilatation. Diverticulosis. Please refer to same day CT pelvis for further details of postsurgical changes of cystectomy and urinary diversion. Electronically Signed   By: Megan  Zare M.D.   On: 01/23/2024 19:49   MR 3D Recon At Scanner Result Date: 01/23/2024 CLINICAL DATA:  Right upper quadrant pain and elevated liver function tests EXAM: MRI ABDOMEN WITHOUT AND WITH CONTRAST (INCLUDING MRCP) TECHNIQUE: Multiplanar multisequence MR imaging of the abdomen was performed both before and after the administration of intravenous  contrast. Heavily T2-weighted images of the biliary and pancreatic ducts were obtained, and three-dimensional MRCP images were rendered by post processing. CONTRAST:  10mL GADAVIST  GADOBUTROL  1 MMOL/ML IV SOLN COMPARISON:  Ultrasound abdomen January 23, 2024 CT abdomen pelvis January 23, 2024. FINDINGS: Lower chest: Unremarkable. Hepatobiliary/MRCP: Liver measures 16.9 cm in craniocaudal dimension. No significant hepatic steatosis. No focal liver lesion. Gallbladder is normal. Intra and extrahepatic bile ducts appear normal. CBD measures 2.3 cm. Partial visualization of the diverted ureters on MRCP views with normal diameter. Pancreas: No pancreatic abnormality or ductal dilatation. No mass lesion or peripancreatic fluid collection. Spleen:  Spleen is normal in size. Adrenals/Urinary Tract: Left adrenal nodules consistent with lipid rich adenoma, signal drop on out of phase sequence measuring 1.8 cm, stable to prior. Right adrenal is normal. Multiple T1 hyperintense renal cysts consistent with cysts containing proteinaceous material/blood products. Additional few simple appearing T2 hyperintense cysts in both kidneys which does not require imaging follow-up. No hydronephrosis.Exam does not include pelvic cavity. Please refer to same day CT pelvis. Stomach/Bowel: Stomach is unremarkable. Diverticulosis without diverticulitis. Small and large bowel loops appear nondilated and unremarkable. Vascular/Lymphatic: No lymphadenopathy. Vascular structures are normal. Musculoskeletal: No suspicious osseous lesion. IMPRESSION: Left adrenal lipid rich adenoma which does not require imaging follow-up. Bilateral T1 and T2 hyperintense renal  cysts without abnormal enhancement (Bosniak 2). These lesions does not require imaging follow-up. No significant hepatic steatosis or focal liver lesion. No biliary dilatation. Diverticulosis. Please refer to same day CT pelvis for further details of postsurgical changes of cystectomy and  urinary diversion. Electronically Signed   By: Megan  Zare M.D.   On: 01/23/2024 19:49   US  Abdomen Limited RUQ (LIVER/GB) Result Date: 01/23/2024 CLINICAL DATA:  Pain. EXAM: ULTRASOUND ABDOMEN LIMITED RIGHT UPPER QUADRANT COMPARISON:  None Available. FINDINGS: Gallbladder: No gallstones are visualized. The gallbladder wall measures 3.8 mm in thickness. No sonographic Murphy sign noted by sonographer. Common bile duct: Diameter: N/A  (The common bile duct is not visualized.) Liver: No focal lesion identified. Within normal limits in parenchymal echogenicity. Portal vein is patent on color Doppler imaging with normal direction of blood flow towards the liver. Other: The study is technically limited secondary to the patient's body habitus, as per the ultrasound technologist. IMPRESSION: 1. Limited study with subsequently limited visualization of the common bile duct. 2. No evidence of cholelithiasis or acute cholecystitis. Electronically Signed   By: Suzen Dials M.D.   On: 01/23/2024 16:52   CT ABDOMEN PELVIS W CONTRAST Result Date: 01/23/2024 CLINICAL DATA:  Right upper quadrant pain, elevated LFTs, chest pressure EXAM: CT ABDOMEN AND PELVIS WITH CONTRAST TECHNIQUE: Multidetector CT imaging of the abdomen and pelvis was performed using the standard protocol following bolus administration of intravenous contrast. RADIATION DOSE REDUCTION: This exam was performed according to the departmental dose-optimization program which includes automated exposure control, adjustment of the mA and/or kV according to patient size and/or use of iterative reconstruction technique. CONTRAST:  80mL OMNIPAQUE  IOHEXOL  300 MG/ML  SOLN COMPARISON:  08/12/2021 FINDINGS: Lower chest: No acute pleural or parenchymal lung disease. Hepatobiliary: No focal liver abnormality is seen. No gallstones, gallbladder wall thickening, or biliary dilatation. Pancreas: Unremarkable. No pancreatic ductal dilatation or surrounding inflammatory  changes. Spleen: Normal in size without focal abnormality. Adrenals/Urinary Tract: Postsurgical changes from cystectomy and right lower quadrant urinary diversion. Simple appearing cortical and peripelvic renal cysts are identified, which do not require specific imaging follow-up. No urinary tract calculi or obstructive uropathy. Stable left adrenal nodularity consistent with benign adenoma, no follow-up recommended. Right adrenal is unremarkable. Stomach/Bowel: No bowel obstruction or ileus. Distal colonic diverticulosis without diverticulitis. Nondilated ileal conduit in the right lower quadrant, in this patient with prior cystectomy. Vascular/Lymphatic: Aortic atherosclerosis. No enlarged abdominal or pelvic lymph nodes. Reproductive: Prior prostatectomy. No abnormalities within the prostate bed. Other: No free fluid or free intraperitoneal gas. No abdominal wall hernia. Musculoskeletal: No acute or destructive bony abnormalities. Reconstructed images demonstrate no additional findings. IMPRESSION: 1. No acute intra-abdominal or intrapelvic process. 2. Distal colonic diverticulosis without diverticulitis. 3. Cystoprostatectomy, with unremarkable urinary diversion right lower quadrant. 4.  Aortic Atherosclerosis (ICD10-I70.0). Electronically Signed   By: Ozell Daring M.D.   On: 01/23/2024 15:50   DG Chest Port 1 View Result Date: 01/23/2024 CLINICAL DATA:  Shortness of breath EXAM: PORTABLE CHEST 1 VIEW COMPARISON:  Chest radiograph August 13, 2021 FINDINGS: The heart size is borderline normal. Mediastinal contours are within normal limits. Both lungs are clear. Aortic knob is prominent. Thoracic aorta is tortuous. Degenerative changes of the spine. No pleural effusion or pneumothorax. IMPRESSION: No active disease. Electronically Signed   By: Megan  Zare M.D.   On: 01/23/2024 11:16        Scheduled Meds:  enoxaparin  (LOVENOX ) injection  50 mg Subcutaneous Q24H   insulin  aspart  0-15 Units Subcutaneous  TID WC   insulin  aspart  0-5 Units Subcutaneous QHS   insulin  glargine  44 Units Subcutaneous QHS   pantoprazole   40 mg Oral Daily   Continuous Infusions:   LOS: 1 day    Time spent: 51 minutes spent on 01/24/2024 caring for this patient face-to-face including chart review, ordering labs/tests, documenting, discussion with nursing staff, consultants, updating family and interview/physical exam    Camellia PARAS [place], DO [facility] Available via Epic secure chat 7am-7pm After these hours, please refer to coverage provider listed on [email or web address] 01/24/2024, 12:40 PM

## 2024-01-25 ENCOUNTER — Telehealth: Payer: Self-pay

## 2024-01-25 NOTE — Transitions of Care (Post Inpatient/ED Visit) (Signed)
 01/25/2024  Name: [name] [name] MRN: [id number] DOB: 03-31-1934  Today's TOC FU Call Status: Today's TOC FU Call Status:: Successful TOC FU Call Completed TOC FU Call Complete Date: 01/25/24 Patient's Name and Date of Birth confirmed. Patient states PCP is the [facility] but sees Dr. Rollene about once a year.  He insists that his medication changes/updates come from the [place].  Transition Care Management Follow-up Telephone Call Date of Discharge: 01/24/24 Discharge Facility: [facility]) Type of Discharge: Inpatient Admission Primary Inpatient Discharge Diagnosis:: Acute hepatic encephalopathy How have you been since you were released from the hospital?: Better Any questions or concerns?: No  Items Reviewed: Did you receive and understand the discharge instructions provided?: Yes Medications obtained,verified, and reconciled?: Yes (Medications Reviewed) Any new allergies since your discharge?: No Dietary orders reviewed?: Yes Type of Diet Ordered:: Heart Healthy Do you have support at home?: Yes People in Home [RPT]: friend(s) Name of Support/Comfort Primary Source: Angela  Medications Reviewed Today: Medications Reviewed Today     Reviewed by Avontae Burkhead, RN (Case Manager) on 01/25/24 at 1446  Med List Status: <None>   Medication Order Taking? Sig Documenting Provider Last Dose Status Informant  aspirin  325 MG tablet [id number] Yes Take 1 tablet (325 mg total) by mouth daily. Nieves Cough, MD  Active Self  bismuth  subsalicylate (PEPTO BISMOL) 262 MG/15ML suspension [id number] Yes Take 30 mLs by mouth every 6 (six) hours as needed for diarrhea or loose stools. [provider]  Active Self  Carboxymethylcellulose Sod PF 0.5 % SOLN [id number] Yes Place 1 drop into both eyes in the morning, at noon, and at bedtime. [provider]  Active Self  cetirizine (ZYRTEC) 10 MG tablet [id number] Yes Take 10 mg by mouth daily. [provider]  Active  Self  Cholecalciferol (VITAMIN D -3) 25 MCG (1000 UT) CAPS [id number] Yes Take 1,000 Units by mouth daily with breakfast. [provider]  Active Self  empagliflozin (JARDIANCE) 25 MG TABS tablet [id number] Yes Take 12.5 mg by mouth in the morning. [provider]  Active Self  glucose blood (FREESTYLE LITE) test strip [id number] Yes Use as instructed Rollene Almarie LABOR, MD  Active   insulin  aspart (NOVOLOG ) 100 UNIT/ML FlexPen [id number] Yes Inject 10-12 Units into the skin See admin instructions. Inject 12 units into the skin before breakfast & supper and 10 units at 11 PM [provider]  Active Self  insulin  glargine (LANTUS ) 100 UNIT/ML Solostar Pen [id number] Yes Inject 44 Units into the skin in the morning. [provider]  Active Self  ketotifen (ZADITOR) 0.025 % ophthalmic solution [id number] Yes Place 1 drop into both eyes 2 (two) times daily as needed (for inflammation). [provider]  Active Self  magnesium  hydroxide (MILK OF MAGNESIA) 400 MG/5ML suspension [id number] Yes Take 30 mLs by mouth daily as needed for moderate constipation. [provider]  Active Self  omeprazole (PRILOSEC) 20 MG capsule [id number] Yes Take 20 mg by mouth 2 (two) times daily before a meal. [provider]  Active Self  simvastatin  (ZOCOR ) 20 MG tablet [id number]  Take 20 mg by mouth at bedtime.  Patient not taking: Reported on 01/25/2024   [provider]  Active Self  Med List Note Marisa Nathanel LOISE Bishop 08/13/21 1839): [id number]            Home Care and Equipment/Supplies: Were Home Health Services Ordered?: NA Any new equipment or medical supplies ordered?: NA  Functional Questionnaire: Do you need assistance  with bathing/showering or dressing?: No Do you need assistance with meal preparation?: No Do you need assistance with eating?: No Do you have difficulty maintaining continence: No Do you need assistance with getting out of bed/getting  out of a chair/moving?: No Do you have difficulty managing or taking your medications?: No  Follow up appointments reviewed: PCP Follow-up appointment confirmed?: Yes Date of PCP follow-up appointment?: 02/05/24 Follow-up Provider: Dr. Lovina sees Dr. Rollene about once a year but primary doctor is Dr. Onetha at the [facility]. [facility]. Patient reports nurse calls him about once a week. Specialist Hospital Follow-up appointment confirmed?: NA Do you need transportation to your follow-up appointment?: No Do you understand care options if your condition(s) worsen?: Yes-patient verbalized understanding   Per Beagley DOROTHA Seeds RN, MSN [facility]  [facility], [facility] RN Care Manager Direct Dial: [phone]  Fax: [phone] Website: [email or web address]

## 2024-01-25 NOTE — Patient Instructions (Signed)
 Visit Information  Thank you for taking time to visit with me today. Please don't hesitate to contact me if I can be of assistance to you.  Call MD for: difficulty breathing, headache or visual disturbances Call MD for: extreme fatigue Call MD for: persistant dizziness or light-headedness Call MD for: persistant nausea and vomiting Call MD for: severe uncontrolled pain Call MD for: temperature >100.4  The patient verbalized understanding of instructions, educational materials, and care plan provided today and DECLINED offer to receive copy of patient instructions, educational materials, and care plan.   The patient has been provided with contact information for the care management team and has been advised to call with any health related questions or concerns.   Please call the care guide team at [phone] if you need to cancel or reschedule your appointment.   Please call the Suicide and Crisis Lifeline: 988 if you are experiencing a Mental Health or Behavioral Health Crisis or need someone to talk to.  Lylian Sanagustin J. Raphaela Cannaday RN, MSN [facility], [facility] RN Care Manager Direct Dial: [phone]  Fax: [phone] Website: [email or web address]

## 2024-02-05 ENCOUNTER — Ambulatory Visit: Admitting: Internal Medicine

## 2024-02-05 ENCOUNTER — Encounter: Payer: Self-pay | Admitting: Internal Medicine

## 2024-02-05 VITALS — BP 140/82 | HR 78 | Temp 99.1°F | Ht 71.0 in | Wt 249.0 lb

## 2024-02-05 DIAGNOSIS — K219 Gastro-esophageal reflux disease without esophagitis: Secondary | ICD-10-CM

## 2024-02-05 DIAGNOSIS — K769 Liver disease, unspecified: Secondary | ICD-10-CM | POA: Diagnosis not present

## 2024-02-05 DIAGNOSIS — I10 Essential (primary) hypertension: Secondary | ICD-10-CM

## 2024-02-05 DIAGNOSIS — E1022 Type 1 diabetes mellitus with diabetic chronic kidney disease: Secondary | ICD-10-CM | POA: Diagnosis not present

## 2024-02-05 DIAGNOSIS — N1832 Chronic kidney disease, stage 3b: Secondary | ICD-10-CM

## 2024-02-05 DIAGNOSIS — Z23 Encounter for immunization: Secondary | ICD-10-CM | POA: Diagnosis not present

## 2024-02-05 DIAGNOSIS — E1069 Type 1 diabetes mellitus with other specified complication: Secondary | ICD-10-CM

## 2024-02-05 LAB — COMPREHENSIVE METABOLIC PANEL WITH GFR
ALT: 18 U/L (ref 0–53)
AST: 16 U/L (ref 0–37)
Albumin: 3.9 g/dL (ref 3.5–5.2)
Alkaline Phosphatase: 94 U/L (ref 39–117)
BUN: 26 mg/dL — ABNORMAL HIGH (ref 6–23)
CO2: 21 meq/L (ref 19–32)
Calcium: 9.5 mg/dL (ref 8.4–10.5)
Chloride: 106 meq/L (ref 96–112)
Creatinine, Ser: 2.11 mg/dL — ABNORMAL HIGH (ref 0.40–1.50)
GFR: 27.41 mL/min — ABNORMAL LOW (ref >60.00)
Glucose, Bld: 209 mg/dL — ABNORMAL HIGH (ref 70–99)
Potassium: 4 meq/L (ref 3.5–5.1)
Sodium: 135 meq/L (ref 135–145)
Total Bilirubin: 0.5 mg/dL (ref 0.2–1.2)
Total Protein: 7.6 g/dL (ref 6.0–8.3)

## 2024-02-05 LAB — CBC
HCT: 29.6 % — ABNORMAL LOW (ref 39.0–52.0)
Hemoglobin: 9.1 g/dL — ABNORMAL LOW (ref 13.0–17.0)
MCHC: 30.8 g/dL (ref 30.0–36.0)
MCV: 78.6 fl (ref 78.0–100.0)
Platelets: 285 K/uL (ref 150.0–400.0)
RBC: 3.77 Mil/uL — ABNORMAL LOW (ref 4.22–5.81)
RDW: 17.7 % — ABNORMAL HIGH (ref 11.5–15.5)
WBC: 6.8 K/uL (ref 4.0–10.5)

## 2024-02-05 NOTE — Patient Instructions (Signed)
We will recheck the labs today.   

## 2024-02-05 NOTE — Progress Notes (Signed)
 "  Subjective:   Patient ID: [name] [name], male    DOB: 16-Jan-1934, 89 y.o.   MRN: [id number]  Discussed the use of AI scribe software for clinical note transcription with the patient, who gave verbal consent to proceed.  History of Present Illness [name] [name] is an 89 year old male who presents with low blood pressure and recent hospitalization for elevated liver enzymes.  He has been experiencing episodes of low blood pressure, with readings as low as 77/44 mmHg, leading to dizziness and near syncope. He has not taken any blood pressure medication for the past three months due to these low readings.  Approximately two weeks ago, he was hospitalized due to elevated liver enzymes. He recalls that during his hospitalization, he underwent extensive testing, including checks for hepatitis, gallbladder stones, and liver disease, but he was not told of a definitive cause. His cholesterol medication was suspected to be the cause, and he discontinued it. He has not had his liver enzymes rechecked since discharge.  He experienced chest tightness, prompting a visit to the emergency room. This was initially suspected to be a heart attack, but he attributes it to muscle strain from moving a refrigerator. He has a history of similar muscle pain in the chest and was previously advised to avoid heavy lifting.  He experiences constipation and occasional stomach pain, which he associates with gas buildup. He is taking medication for acid reflux, but the dosage was reduced due to stomach discomfort. He has not taken baking soda for stomach acid due to past family experiences.  He recently lost his youngest brother and is unable to attend the funeral due to feeling unwell and fearing he might pass out during travel. No new or worsening breathing difficulties. No confusion during his recent hospitalization. PMH, [facility], social history reviewed and updated    Review of Systems  Constitutional: Negative.    HENT: Negative.    Eyes: Negative.   Respiratory:  Negative for cough, chest tightness and shortness of breath.   Cardiovascular:  Negative for chest pain, palpitations and leg swelling.  Gastrointestinal:  Negative for abdominal distention, abdominal pain, constipation, diarrhea, nausea and vomiting.  Musculoskeletal: Negative.   Skin: Negative.   Neurological:  Positive for weakness and light-headedness.  Psychiatric/Behavioral: Negative.      Objective:  Physical Exam Constitutional:      Appearance: He is well-developed.  HENT:     Head: Normocephalic and atraumatic.  Cardiovascular:     Rate and Rhythm: Normal rate and regular rhythm.  Pulmonary:     Effort: Pulmonary effort is normal. No respiratory distress.     Breath sounds: Normal breath sounds. No wheezing or rales.  Abdominal:     General: Bowel sounds are normal. There is no distension.     Palpations: Abdomen is soft.     Tenderness: There is no abdominal tenderness.     Comments: Urostomy in place no change  Musculoskeletal:     Cervical back: Normal range of motion.  Skin:    General: Skin is warm and dry.  Neurological:     Mental Status: He is alert and oriented to person, place, and time.     Coordination: Coordination abnormal.     Comments: cane     Vitals:   02/05/24 1430  BP: (!) 142/80  Pulse: 78  Temp: 99.1 F (37.3 C)  TempSrc: Oral  SpO2: 98%  Weight: 249 lb (112.9 kg)  Height: 5' 11 (1.803 m)   Flu  shot given at visit  Assessment and Plan Assessment & Plan Abnormal liver function tests   He was recently hospitalized for elevated liver enzymes, but extensive testing was inconclusive. A possible medication-induced liver injury from cholesterol medication is suspected, with low blood pressure potentially contributing. Imaging showed no evidence of hepatitis, gallbladder stones, or specific liver disease. Order liver function tests today to re-evaluate enzyme levels. D/C statin from  medication list. Would likely not retry this given the severity of abnormal LFTs.   Hypotension   He experiences episodes of significantly low blood pressure causing dizziness and near syncope. Blood pressure medications have been discontinued. Possible normalization occurred during hospitalization due to stress.  Chronic kidney disease stage 3b previously now in stage 4 range but not consistent He has chronic kidney disease. Metformin was discontinued due to kidney function concerns. A nephrology follow-up is scheduled through the [facility] where he gets the majority of his care.  Gastroesophageal reflux disease   GERD is managed with omeprazole. He reports stomach pain with higher doses. The current regimen includes omeprazole and milk of magnesia.   "

## 2024-02-06 ENCOUNTER — Ambulatory Visit: Payer: Self-pay | Admitting: Internal Medicine

## 2024-02-06 NOTE — Assessment & Plan Note (Signed)
 GERD is managed with omeprazole. He reports stomach pain with higher doses. The current regimen includes omeprazole and milk of magnesia.

## 2024-02-06 NOTE — Assessment & Plan Note (Addendum)
 Recent HgA1c 9.9 which is elevated [facility] adjusts [facility]  regimen and they did stop metformin recently (presumably due to renal function). He will work with them on regimen.

## 2024-02-06 NOTE — Assessment & Plan Note (Signed)
 He was recently hospitalized for elevated liver enzymes, but extensive testing was inconclusive. A possible medication-induced liver injury from cholesterol medication is suspected, with low blood pressure potentially contributing. Imaging showed no evidence of hepatitis, gallbladder stones, or specific liver disease. Order liver function tests today to re-evaluate enzyme levels. D/C statin from medication list. Would likely not retry this given the severity of abnormal LFTs.

## 2024-02-06 NOTE — Assessment & Plan Note (Signed)
 He experiences episodes of significantly low blood pressure causing dizziness and near syncope. Blood pressure medications have been discontinued. Possible normalization occurred during hospitalization due to stress.

## 2024-02-06 NOTE — Assessment & Plan Note (Signed)
 stage 3b previously now in stage 4 range but not consistent He has chronic kidney disease. Metformin was discontinued due to kidney function concerns. A nephrology follow-up is scheduled through the [facility] where he gets the majority of his care.

## 2024-02-09 NOTE — Progress Notes (Signed)
 I have called patient and lvm 1st attempt. I have also mailed out the results to patient to take to the [facility]

## 2024-03-28 ENCOUNTER — Ambulatory Visit: Admitting: Podiatry

## 2024-04-02 DIAGNOSIS — R3121 Asymptomatic microscopic hematuria: Secondary | ICD-10-CM | POA: Diagnosis not present

## 2024-04-02 DIAGNOSIS — N281 Cyst of kidney, acquired: Secondary | ICD-10-CM | POA: Diagnosis not present

## 2024-04-02 DIAGNOSIS — Z8551 Personal history of malignant neoplasm of bladder: Secondary | ICD-10-CM | POA: Diagnosis not present

## 2024-04-04 ENCOUNTER — Ambulatory Visit (INDEPENDENT_AMBULATORY_CARE_PROVIDER_SITE_OTHER): Admitting: Podiatry

## 2024-04-04 ENCOUNTER — Encounter: Payer: Self-pay | Admitting: Podiatry

## 2024-04-04 DIAGNOSIS — E119 Type 2 diabetes mellitus without complications: Secondary | ICD-10-CM | POA: Diagnosis not present

## 2024-04-04 DIAGNOSIS — B351 Tinea unguium: Secondary | ICD-10-CM

## 2024-04-04 DIAGNOSIS — M79676 Pain in unspecified toe(s): Secondary | ICD-10-CM

## 2024-04-04 NOTE — Progress Notes (Signed)
 This patient returns to my office for at risk foot care.  This patient requires this care by a professional since this patient will be at risk due to having type 1 diabetes.   This patient is unable to cut nails himself since the patient cannot reach his nails.These nails are painful walking and wearing shoes.  This patient presents for at risk foot care today.  General Appearance  Alert, conversant and in no acute stress.  Vascular  Dorsalis pedis and posterior tibial  pulses are  weakly palpable  bilaterally.  Capillary return is within normal limits  bilaterally. Temperature is within normal limits  bilaterally.  Neurologic  Senn-Weinstein monofilament wire test within normal limits  bilaterally. Muscle power within normal limits bilaterally.  Nails Thick disfigured discolored nails with subungual debris  from hallux to fifth toes bilaterally. No evidence of bacterial infection or drainage bilaterally.  Orthopedic  No limitations of motion  feet .  No crepitus or effusions noted.  No bony pathology or digital deformities noted  Skin  normotropic skin with no porokeratosis noted bilaterally.  No signs of infections or ulcers noted.     Onychomycosis  Pain in right toes  Pain in left toes  Consent was obtained for treatment procedures.   Mechanical debridement of nails 1-5  bilaterally performed with a nail nipper.  Filed with dremel without incident.     Return office visit    10 weeks                  Told patient to return for periodic foot care and evaluation due to potential at risk complications.   Cordella Bold DPM

## 2024-06-13 ENCOUNTER — Ambulatory Visit: Admitting: Podiatry

## 2024-07-02 ENCOUNTER — Ambulatory Visit: Admitting: Podiatry
